# Patient Record
Sex: Male | Born: 1950 | Race: White | Hispanic: No | Marital: Married | Smoking: Current every day smoker
Health system: Southern US, Community
[De-identification: ages and names within clinical notes are randomized; demographics above are authoritative.]

## PROBLEM LIST (undated history)

## (undated) DIAGNOSIS — M1990 Unspecified osteoarthritis, unspecified site: Secondary | ICD-10-CM

## (undated) DIAGNOSIS — K219 Gastro-esophageal reflux disease without esophagitis: Secondary | ICD-10-CM

## (undated) DIAGNOSIS — R519 Headache, unspecified: Secondary | ICD-10-CM

## (undated) DIAGNOSIS — G8929 Other chronic pain: Secondary | ICD-10-CM

## (undated) DIAGNOSIS — J309 Allergic rhinitis, unspecified: Secondary | ICD-10-CM

## (undated) DIAGNOSIS — J449 Chronic obstructive pulmonary disease, unspecified: Secondary | ICD-10-CM

## (undated) HISTORY — PX: KNEE SURGERY: SHX244

## (undated) HISTORY — DX: Chronic obstructive pulmonary disease, unspecified: J44.9

## (undated) HISTORY — PX: JOINT REPLACEMENT: SHX530

## (undated) HISTORY — DX: Other chronic pain: G89.29

## (undated) HISTORY — PX: SHOULDER SURGERY: SHX246

## (undated) HISTORY — PX: HAND SURGERY: SHX662

## (undated) HISTORY — DX: Allergic rhinitis, unspecified: J30.9

---

## 2000-12-14 ENCOUNTER — Emergency Department (HOSPITAL_COMMUNITY): Admission: EM | Admit: 2000-12-14 | Discharge: 2000-12-14 | Payer: Self-pay | Admitting: Emergency Medicine

## 2001-12-02 ENCOUNTER — Emergency Department (HOSPITAL_COMMUNITY): Admission: EM | Admit: 2001-12-02 | Discharge: 2001-12-02 | Payer: Self-pay | Admitting: Emergency Medicine

## 2002-04-04 ENCOUNTER — Encounter: Payer: Self-pay | Admitting: Specialist

## 2002-04-04 ENCOUNTER — Ambulatory Visit (HOSPITAL_COMMUNITY): Admission: RE | Admit: 2002-04-04 | Discharge: 2002-04-04 | Payer: Self-pay | Admitting: Specialist

## 2004-01-18 ENCOUNTER — Ambulatory Visit (HOSPITAL_COMMUNITY): Admission: RE | Admit: 2004-01-18 | Discharge: 2004-01-18 | Payer: Self-pay | Admitting: Family Medicine

## 2004-01-22 ENCOUNTER — Ambulatory Visit (HOSPITAL_COMMUNITY): Admission: RE | Admit: 2004-01-22 | Discharge: 2004-01-22 | Payer: Self-pay | Admitting: Family Medicine

## 2004-03-05 ENCOUNTER — Inpatient Hospital Stay (HOSPITAL_COMMUNITY): Admission: RE | Admit: 2004-03-05 | Discharge: 2004-03-10 | Payer: Self-pay | Admitting: Specialist

## 2007-01-29 ENCOUNTER — Emergency Department (HOSPITAL_COMMUNITY): Admission: EM | Admit: 2007-01-29 | Discharge: 2007-01-29 | Payer: Self-pay | Admitting: Emergency Medicine

## 2008-05-12 HISTORY — PX: COLONOSCOPY: SHX174

## 2008-12-11 ENCOUNTER — Emergency Department (HOSPITAL_COMMUNITY): Admission: EM | Admit: 2008-12-11 | Discharge: 2008-12-11 | Payer: Self-pay | Admitting: Emergency Medicine

## 2009-02-04 ENCOUNTER — Emergency Department (HOSPITAL_COMMUNITY): Admission: EM | Admit: 2009-02-04 | Discharge: 2009-02-04 | Payer: Self-pay | Admitting: Emergency Medicine

## 2009-02-27 ENCOUNTER — Encounter: Payer: Self-pay | Admitting: Internal Medicine

## 2009-03-13 ENCOUNTER — Ambulatory Visit: Payer: Self-pay | Admitting: Internal Medicine

## 2009-03-13 ENCOUNTER — Encounter: Payer: Self-pay | Admitting: Internal Medicine

## 2009-03-13 ENCOUNTER — Ambulatory Visit (HOSPITAL_COMMUNITY): Admission: RE | Admit: 2009-03-13 | Discharge: 2009-03-13 | Payer: Self-pay | Admitting: Internal Medicine

## 2009-03-16 ENCOUNTER — Encounter: Payer: Self-pay | Admitting: Internal Medicine

## 2009-03-19 ENCOUNTER — Telehealth (INDEPENDENT_AMBULATORY_CARE_PROVIDER_SITE_OTHER): Payer: Self-pay

## 2009-04-12 ENCOUNTER — Telehealth (INDEPENDENT_AMBULATORY_CARE_PROVIDER_SITE_OTHER): Payer: Self-pay

## 2009-07-23 ENCOUNTER — Ambulatory Visit (HOSPITAL_COMMUNITY): Admission: RE | Admit: 2009-07-23 | Discharge: 2009-07-23 | Payer: Self-pay | Admitting: Specialist

## 2010-04-29 ENCOUNTER — Ambulatory Visit (HOSPITAL_COMMUNITY)
Admission: RE | Admit: 2010-04-29 | Discharge: 2010-04-29 | Payer: Self-pay | Source: Home / Self Care | Attending: Specialist | Admitting: Specialist

## 2010-06-26 ENCOUNTER — Other Ambulatory Visit: Payer: Self-pay | Admitting: Orthopedic Surgery

## 2010-06-26 ENCOUNTER — Encounter (HOSPITAL_COMMUNITY): Payer: BC Managed Care – PPO

## 2010-06-26 ENCOUNTER — Other Ambulatory Visit (HOSPITAL_COMMUNITY): Payer: Self-pay | Admitting: Orthopedic Surgery

## 2010-06-26 ENCOUNTER — Ambulatory Visit (HOSPITAL_COMMUNITY)
Admission: RE | Admit: 2010-06-26 | Discharge: 2010-06-26 | Disposition: A | Discharge status: Home / Self Care | Payer: BC Managed Care – PPO | Source: Ambulatory Visit | Attending: Orthopedic Surgery | Admitting: Orthopedic Surgery

## 2010-06-26 DIAGNOSIS — J438 Other emphysema: Secondary | ICD-10-CM | POA: Insufficient documentation

## 2010-06-26 DIAGNOSIS — Z96659 Presence of unspecified artificial knee joint: Secondary | ICD-10-CM | POA: Insufficient documentation

## 2010-06-26 DIAGNOSIS — M25569 Pain in unspecified knee: Secondary | ICD-10-CM | POA: Insufficient documentation

## 2010-06-26 DIAGNOSIS — Z01818 Encounter for other preprocedural examination: Secondary | ICD-10-CM

## 2010-06-26 DIAGNOSIS — R9431 Abnormal electrocardiogram [ECG] [EKG]: Secondary | ICD-10-CM | POA: Insufficient documentation

## 2010-06-26 DIAGNOSIS — Z01812 Encounter for preprocedural laboratory examination: Secondary | ICD-10-CM | POA: Insufficient documentation

## 2010-06-26 DIAGNOSIS — Z01810 Encounter for preprocedural cardiovascular examination: Secondary | ICD-10-CM | POA: Insufficient documentation

## 2010-06-26 DIAGNOSIS — T8489XA Other specified complication of internal orthopedic prosthetic devices, implants and grafts, initial encounter: Secondary | ICD-10-CM | POA: Insufficient documentation

## 2010-06-26 LAB — BASIC METABOLIC PANEL
Calcium: 9.1 mg/dL (ref 8.4–10.5)
Chloride: 105 mEq/L (ref 96–112)
Creatinine, Ser: 1.05 mg/dL (ref 0.4–1.5)
GFR calc Af Amer: >60 mL/min (ref >60)
GFR calc non Af Amer: >60 mL/min (ref >60)

## 2010-06-26 LAB — DIFFERENTIAL
Basophils Absolute: 0 10*3/uL (ref 0.0–0.1)
Basophils Relative: 0 % (ref 0–1)
Monocytes Relative: 7 % (ref 3–12)
Neutro Abs: 5.1 10*3/uL (ref 1.7–7.7)
Neutrophils Relative %: 70 % (ref 43–77)

## 2010-06-26 LAB — URINALYSIS, ROUTINE W REFLEX MICROSCOPIC
Ketones, ur: NEGATIVE mg/dL
Nitrite: NEGATIVE
Protein, ur: NEGATIVE mg/dL
Urobilinogen, UA: 0.2 mg/dL (ref 0.0–1.0)

## 2010-06-26 LAB — PROTIME-INR
INR: 1.01 (ref 0.00–1.49)
Prothrombin Time: 13.5 seconds (ref 11.6–15.2)

## 2010-06-26 LAB — CBC
Hemoglobin: 15.9 g/dL (ref 13.0–17.0)
MCHC: 33.8 g/dL (ref 30.0–36.0)

## 2010-06-26 LAB — APTT: aPTT: 35 seconds (ref 24–37)

## 2010-07-02 DIAGNOSIS — J449 Chronic obstructive pulmonary disease, unspecified: Secondary | ICD-10-CM | POA: Diagnosis present

## 2010-07-02 DIAGNOSIS — Y831 Surgical operation with implant of artificial internal device as the cause of abnormal reaction of the patient, or of later complication, without mention of misadventure at the time of the procedure: Secondary | ICD-10-CM | POA: Diagnosis present

## 2010-07-02 DIAGNOSIS — J4489 Other specified chronic obstructive pulmonary disease: Secondary | ICD-10-CM | POA: Diagnosis present

## 2010-07-02 DIAGNOSIS — Z96659 Presence of unspecified artificial knee joint: Secondary | ICD-10-CM

## 2010-07-02 DIAGNOSIS — T84039A Mechanical loosening of unspecified internal prosthetic joint, initial encounter: Principal | ICD-10-CM | POA: Diagnosis present

## 2010-07-02 LAB — ABO/RH: ABO/RH(D): O POS

## 2010-07-03 LAB — CBC
HCT: 37.8 % — ABNORMAL LOW (ref 39.0–52.0)
MCV: 90.4 fL (ref 78.0–100.0)
RBC: 4.18 MIL/uL — ABNORMAL LOW (ref 4.22–5.81)
WBC: 11.9 10*3/uL — ABNORMAL HIGH (ref 4.0–10.5)

## 2010-07-03 LAB — BASIC METABOLIC PANEL
BUN: 10 mg/dL (ref 6–23)
Chloride: 106 mEq/L (ref 96–112)
Glucose, Bld: 150 mg/dL — ABNORMAL HIGH (ref 70–99)
Potassium: 4.4 mEq/L (ref 3.5–5.1)
Sodium: 137 mEq/L (ref 135–145)

## 2010-07-05 NOTE — Op Note (Signed)
NAME:  Dustin Walker, Dustin Walker             ACCOUNT NO.:  [id number]  MEDICAL RECORD NO.:  [id number]           PATIENT TYPE:  I  LOCATION:  1612                         FACILITY:  [facility]  PHYSICIAN:  Madlyn Frankel. Charlann Boxer, M.D.  DATE OF BIRTH:  Jan 10, 1951  DATE OF PROCEDURE:  07/02/2010 DATE OF DISCHARGE:                              OPERATIVE REPORT   PREOPERATIVE DIAGNOSES: 1. 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 2. 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  POSTOPERATIVE DIAGNOSIS: 1. 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 2. 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  PROCEDURES: 1. Revision left total knee replacement with a complete knee     synovectomy. 2. Polyethylene exchange going to a size 15 mm insert to match the     size 9 tibial tray that was in place. 3. Patellar revision switching out to a size 11 domed patella     resurfacing, this was [employer] component.  SURGEON:  Madlyn Frankel. Charlann Boxer, MD  ASSISTANT:  Nelia Shi. Webb Silversmith, RN  ANESTHESIA:  Spinal.  SPECIMENS:  None.  COMPLICATIONS:  None.  DRAINS:  One Hemovac.  TOURNIQUET TIME:  45 minutes, 250 mmHg.  BLOOD LOSS:  50 mL.  INDICATIONS FOR THE PROCEDURE:  Mr. Medlen is a 60 year old male who had a history of a left total knee replacement performed approximately 6 years ago.  He had initially done well, but had developed recurrent instability sensation with recurrent effusions.  Workup was negative for infection as well as any loosening components by a bone scan.  Based on his physical exam of some hyperextension in play from global instability from extension to flexion, the plan was for revision surgery.  Discussed synovectomy and  polyethylene exchange the primary source of this tissues.  Consent was obtained after reviewing the risks of persistent discomfort, infection, DVT for revision surgery.  Consent was obtained for the benefit of pain relief.  PROCEDURE IN DETAIL:  The patient was brought to operative theater. Once adequate anesthesia, preoperative antibiotics, Ancef administered, he was position with a left thigh tourniquet.  Left lower extremity was then prepped and draped in a sterile fashion.  A time-out was performed identifying the patient, planned procedure, and extremity.  The leg was exsanguinated, tourniquet elevated to 250 mmHg.  The patient's old incision was utilized.  Sharp dissection and soft tissue flaps created, noted very thin skin.  Medial arthrotomy was made.  Following this arthrotomy, first portion of the procedure was designated to complete synovectomy through the medial suprapatellar and lateral gutters debriding of all brownish synovial stained tissue.  I then removed the polyethylene insert from the knee.  At this point, we again trialed from a 12-15 insert, found that the 15 insert provided me the most stability with full extension from extension to flexion.  With the knee in extension, I evaluated  the patella better.  He had a previously small button placed over the medial facet of the patella with a significant portion of lateral facet still exposed.  Based on his persistent pain, this definitely could have been a source of discomfort, in addition to the recurrent instability and swelling.  For this reason, I chose to resurface the patella.  Using oscillating saw, I removed the bone underneath the patellar button and precut measurement was about 26 mm a little bit less.  I resected down about 14 mm and used a size 11 patellar button to restore height as well as cover the entire cut surface.  Leg holes were drilled.  At this point, final components were opened including this 15  mm insert and the patellar button.  Cement was mixed.  We irrigated the knee out with normal saline solution, pulse lavaged, injected the synovial capsule region with 0.25% Marcaine with epinephrine and 1 mL of Toradol.  Once the cement was ready, the patellar component was cemented into position and excessive cement was removed.  Once the cement had fully cured, the final 15 mm insert was locked into position.  The knee was reduced.  We re-irrigated the knee at this point.  The tourniquet was let down after 45 minutes.  No significant hemostasis required.  A medium Hemovac drain was placed deep.  I reapproximated the extensor mechanism in flexion using #1 Vicryl.  The remaining wound was closed with 2-0 Vicryl and then staples on the skin.  The skin was cleaned, dried, and dressed sterilely using Xeroform and a bulky sterile wrap.  He was then brought to the recovery room in stable condition tolerating the procedure well.     Madlyn Frankel Charlann Boxer, M.D.     MDO/MEDQ  D:  07/02/2010  T:  07/03/2010  Job:  [id number]  Electronically Signed by Durene Romans M.D. on 07/05/2010 07:03:45 AM

## 2010-07-05 NOTE — Discharge Summary (Signed)
  NAME:  Dustin Walker, Dustin Walker             ACCOUNT NO.:  [id number]  MEDICAL RECORD NO.:  [id number]           PATIENT TYPE:  I  LOCATION:  1612                         FACILITY:  [facility]  PHYSICIAN:  Madlyn Frankel. Charlann Boxer, M.D.  DATE OF BIRTH:  [birth year]  DATE OF ADMISSION:  07/02/2010 DATE OF DISCHARGE:                              DISCHARGE SUMMARY   HISTORY:  This is a patient who was admitted on July 02, 2010, for a poly revision for his failed left knee arthroplasty.  He had been seen in the office and it was determined he needed to have the poly exchange completed.  ADMITTING DIAGNOSIS:  Failed left knee arthroplasty.  BRIEF HOSPITAL COURSE:  The patient was admitted through same-day surgery on July 02, 2010; taken to operating room theater and underwent the poly exchange and was taken to PACU for recovery and brought to [facility] for further recovery and rehabilitation.  He has done well.  He has been up with physical therapy.  Today, he will complete that 2 times and then discharged home this afternoon.  His diet is regular.  His wound is checked, is clean and dry.  He knows to take Aquacel dressing off and in 8 days, he will follow up with Dr. Charlann Boxer at that time to get the staples out.  His labs are stable today. His H and H is 12.4 and 37.8.  He is afebrile.  His vital signs are stable.  Discharge condition is good.  Discharge medications are as follows: 1. Acetaminophen 325 mg every 4 hours as needed. 2. Colace 100 mg twice daily. 3. Ferrous sulfate 325 mg 3 times a day. 4. Hydrocodone 7.5/325 one to two q.4-6 h p.r.n. pain, 5. Robaxin 500 every 6 hours as needed. 6. MiraLax 17 g a day as needed. 7. Xarelto 10 mg a day for 10 days.     Russell L. Webb Silversmith, RN   ______________________________ Madlyn Frankel Charlann Boxer, M.D.    RLW/MEDQ  D:  07/03/2010  T:  07/03/2010  Job:  [id number]  Electronically Signed by Lauree Chandler NP-C on 07/04/2010 01:22:30 PM Electronically Signed  by Durene Romans M.D. on 07/05/2010 07:03:49 AM

## 2010-08-16 LAB — D-DIMER, QUANTITATIVE: D-Dimer, Quant: 0.29 ug/mL-FEU (ref 0.00–0.48)

## 2010-09-09 NOTE — H&P (Signed)
  NAME:  Dustin Walker, Dustin Walker             ACCOUNT NO.:  [id number]  MEDICAL RECORD NO.:  [id number]           PATIENT TYPE:  I  LOCATION:  1S                           FACILITY:  [facility]  PHYSICIAN:  Madlyn Frankel. Charlann Boxer, M.D.  DATE OF BIRTH:  16-Jan-1951  DATE OF ADMISSION: DATE OF DISCHARGE:                             HISTORY & PHYSICAL   ADMITTING DIAGNOSIS:  Failed left knee arthroplasty.  BRIEF HISTORY:  This is a patient of Dr. Valma Cava, who underwent left knee arthroplasty about 6 years ago, who started having problems with ambulation and pain.  Through exam and radiographic reviews, it has been determined that he has got loose components and will undergo a revision on February 21 with Dr. Charlann Boxer.  PAST MEDICAL HISTORY:  Significant for: 1. Cataracts. 2. 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 3. He has some problems from time to time with joint pain, mostly in     the left knee.  SURGICAL HISTORY:  Significant for the left knee arthroplasty.  CURRENT MEDICATIONS:  Oxycodone as needed.  ALLERGIES:  HE HAS A MEDICINE ALLERGY TO CODEINE AND ALEVE.  REVIEW OF SYSTEMS:  Notable for those difficulties described in history of present illness, past medical history, his review of systems is otherwise unremarkable.  PHYSICAL EXAM:  VITAL SIGNS:  The patient is 5 feet 8 inches, 137 pounds, blood pressure is 110/70, respirations are 22.  His pulse 68. GENERAL:  Health is fair. HEENT:  Shows him to be normocephalic. NECK AND CHEST:  Unremarkable.  He has history of COPD with expiratory wheezes bilaterally in all lobes. HEART:  S1-S2.  No murmurs, rubs, or gallops heard. ABDOMEN:  Soft, nondistended. GI/GU:  Otherwise unremarkable. EXTREMITIES:  Exam shows joint pain in the left knee. DERMATOLOGICALLY AND NEUROLOGICALLY:  Intact.  Labs, x-rays, EKGs pending through [employer].  IMPRESSION:  Left failed knee arthroplasty.  PLAN:  He will be admitted on 21st for a revision of that left  knee with Dr. Charlann Boxer.  His discharge medications include Robaxin, MiraLax, Colace, given to him today.  His pain medicine will be given to him at discharge.     Russell L. Blima Dessert   ______________________________ Madlyn Frankel Charlann Boxer, M.D.    RLW/MEDQ  D:  06/20/2010  T:  06/20/2010  Job:  [id number]  Electronically Signed by Lauree Chandler NP-C on 09/05/2010 01:11:32 PM Electronically Signed by Durene Romans M.D. on 09/09/2010 12:12:16 PM

## 2010-09-27 NOTE — H&P (Signed)
NAME:  Dustin Walker, Dustin Walker NO.:  [id number]   MEDICAL RECORD NO.:  [id number]          PATIENT TYPE:   LOCATION:                                 FACILITY:   PHYSICIAN:  Dr. Thomasena Edis            DATE OF BIRTH:  [birth year]   DATE OF ADMISSION:  DATE OF DISCHARGE:                                HISTORY & PHYSICAL   CHIEF COMPLAINT:  Left knee osteoarthritis.   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 After  failure of conservative measures for resolution of his pain and limitations  of use, he is now scheduled for total knee arthroplasty of the left knee.  The surgery risks, benefits, and aftercare were discussed in detail with the  patient, questions were provided and answered, and surgery to go ahead and  scheduled.   ALLERGIES:  Drug allergy to COCAINE which causes itching and a rash.   CURRENT MEDICATIONS:  Percocet or Darvocet on a p.r.n. basis.   PREVIOUS SURGERY:  Knee arthroscopies and left thumb surgery.   SERIOUS MEDICAL ILLNESSES:  None.   SOCIAL HISTORY:  The patient is a Curator. He is married. He smokes 1 pack  a day and drinks rarely.   FAMILY HISTORY:  Negative.   REVIEW OF SYSTEMS:  CENTRAL NERVOUS SYSTEM:  Negative for headache, blurry  vision, or dizziness. PULMONARY:  Positive for an occasional seasonal cough.  Otherwise, negative for shortness of breath, PND, and orthopnea.  CARDIOVASCULAR:  Negative for chest pain and palpitations. GASTROINTESTINAL:  Negative for ulcers, hepatitis. GENITOURINARY:  Negative for urinary tract  difficulty. MUSCULOSKELETAL:  Positives in HPI.   PHYSICAL EXAMINATION:  VITAL SIGNS:  BP 110/60, respirations 16, pulse 68  and regular.  GENERAL APPEARANCE:  Well-developed, well-nourished gentleman in no acute  distress.  HEENT:  Head  normocephalic. Nose patent. Ears patent. Pupils are equal,  round, and reactive to light. Throat with injection.  NECK:  Supple without adenopathy. Carotids 2+ without bruit.  CHEST:  Clear to auscultation. No rales or rhonchi. Respirations 16.  HEART:  Regular rate and rhythm at 68 beats per minute without murmur.  ABDOMEN:  Soft with active bowel sounds. No masses or organomegaly.  NEUROLOGICAL:  The patient is alert and oriented to time, place, and person.  Cranial nerves II-XII grossly intact.  EXTREMITIES:  Within normal limits with the exception of the left knee which  has 0 to 135 degree range of motion, slight varus deformity, crepitation  throughout the range of motion with occasional catching. Sensation and  circulation are intact. Dorsalis pedis and posterior tibial pulses are 1+.   RADIOLOGIC FINDINGS:  X-rays show end-stage osteoarthritis, left knee.   IMPRESSION:  End-stage osteoarthritis, left knee.   PLAN:  Total knee arthroplasty, left knee.      ________________________________________  Jaquelyn Bitter. Chabon, P.A.  ___________________________________________  Dr. Thomasena Edis    SJC/MEDQ  D:  02/26/2004  T:  02/26/2004  Job:  [id number]

## 2010-09-27 NOTE — Discharge Summary (Signed)
NAME:  Dustin Walker, Dustin Walker NO.:  [id number]   MEDICAL RECORD NO.:  [id number]          PATIENT TYPE:  INP   LOCATION:  0456                         FACILITY:  [facility]   PHYSICIAN:  Erasmo Leventhal, M.D.DATE OF BIRTH:  10-07-1950   DATE OF ADMISSION:  03/05/2004  DATE OF DISCHARGE:  03/10/2004                                 DISCHARGE SUMMARY   ADMISSION DIAGNOSIS:  Left knee end-stage osteoarthritis.   POSTOPERATIVE DIAGNOSIS:  Left knee end-stage osteoarthritis.   OPERATION:  Total knee arthroplasty, left knee.   BRIEF HISTORY:  This is a 60 year old gentleman with a long history of  problems with his left knee with multiple surgeries in the past, who has  developed end-stage osteoarthritis of the left knee after failure of  conservative measures for resolution of his pain and limitation of use.  He  is now scheduled for a total knee arthroplasty of the left knee.  The  surgery, its benefits, and aftercare were discussed in detail.  The  patient's questions were invited and answered.  Surgery was scheduled.   LABORATORY VALUES:  Admission CBC within normal limits.  Hemoglobin and  hematocrit reached a low of 9.1 and 26.8.  Admission PT/PTT within normal  limits.  Discharge PT 23 with an INR of 2.7.  Admission CMET within normal  limits.  He had mild hyponatremia at 133 and 134 during admission, and  glucose mildly elevated intermittently with a high of 174.   HOSPITAL COURSE:  The first postoperative day, the patient was feeling good.  Vital signs are stable.  Temp was to 100.3 max.  I's and O's were good.  Drain put out 415.  Hemoglobin and hematocrit were stable.  Lungs were  clear.  Heart sounds were normal.  Bowel sounds were sluggish.  Calves were  negative.  Neurovascular status was intact.  His incentive spirometry was  increased to every 30 minutes for his mild fever.  Drains were removed  without difficulty, and CPM was started.   The second  postoperative day, he was feeling okay.  Moderate pain was noted.  He had a temp to 102.6 max, now 100.7.  I's and O's were good.  Hemoglobin  and hematocrit were acceptable at 10.1 and 30.4.  O2 sats were down to 93 on  2 liters.  Lungs were decreased breath sounds at the bases.  Chest x-ray  showed probable left lower lobe pneumonia with chronic bronchitis changes.  Medical consult with Dr. Jamie Brookes was obtained, and he recommended  switching him to Avelox, which was done immediately, and then he was seen by  medical service later for further followup.  He was also started on Humibid  and Advair, which helped his breathing and oxygenation.   On the third postoperative day, he was feeling better.  Vital signs were  stable.  Mild tachycardia at 105.  Temp to 102.2.  O2 sat is 93% on room  air.  Hemoglobin and hematocrit 9.1 and 26.8.  Lungs had scattered rhonchi.  Heart sounds are normal.  Bowel sounds are active.  Calves negative.  He  continued  with physical therapy and occupational therapy and continued his  treatment for pneumonia with possible discharge planning for Sunday, if he  was doing well from a medical standpoint.   On the fourth postoperative day, he improved.  His temperature was only to a  max of 101.4.  Vital signs were stable.  The wound was benign.  Calves were  negative.  The medical service felt he was doing better and should be ready  for discharge on Sunday, if he continued to do well.   On the fifth postoperative day, vital signs were stable.  He was afebrile.  O2 sats of 98% on room air were noted.  INR is 2.7.  Lungs were clear.  The  wound was benign.  Calves were negative.  Medical service felt he was ready  for discharge.  He was subsequently discharged home for followup in the  office by [place] in 10 days and followup in one week with Dr. Gerda Diss, his  medical doctor at home.   CONDITION ON DISCHARGE:  Improved.   DISCHARGE MEDICATIONS:  1.  Percocet 1-2  q.4-6h. p.r.n. pain.  2.  Robaxin 1 q.8h. p.r.n. spasm.  3.  Coumadin per pharmacy protocol.  4.  Trinsicon 1 b.i.d.  5.  Avelox per medical service.  6.  Asthma inhalers per medical service.   DISCHARGE INSTRUCTIONS:  He is to do his home therapy.  Return to see Dr.  Gerda Diss next week.  Call to see [place] back in 10 days.      SJC/MEDQ  D:  03/25/2004  T:  03/25/2004  Job:  [id number]   cc:   [facility]

## 2010-09-27 NOTE — Procedures (Signed)
NAME:  BEARL, TALARICO NO.:  [id number]   MEDICAL RECORD NO.:  [id number]                   PATIENT TYPE:  OUT   LOCATION:  RESP                                 FACILITY:  [facility]   PHYSICIAN:  Edward L. Juanetta Gosling, M.D.             DATE OF BIRTH:  1950/11/02   DATE OF PROCEDURE:  DATE OF DISCHARGE:  01/22/2004                              PULMONARY FUNCTION TEST   RESULTS:  1.  Spirometry shows a moderate ventilatory defect with evidence of air flow      obstruction.  The flow volume loop shows marked waver and may not be      totally accurate.  2.  Lung volumes show minimal total restrictive change with total lung      capacity being 78% of predicted (normal is 80%), but there is marked air      trapping with residual volume to total lung capacity ratio of 173% of      predicted.  3.  DLCO is severely reduced.  4.  There is no significant bronchodilator response.      ___________________________________________                                            Oneal Deputy. Juanetta Gosling, M.D.   ELH/MEDQ  D:  01/24/2004  T:  01/24/2004  Job:  [id number]

## 2010-09-27 NOTE — Op Note (Signed)
NAME:  Dustin Walker, Dustin Walker NO.:  [id number]   MEDICAL RECORD NO.:  [id number]          PATIENT TYPE:  INP   LOCATION:  0456                         [facility]ACILITY:  [facility]   PHYSICIAN:  Erasmo Leventhal, M.D.DATE OF BIRTH:  03/02/1951   DATE OF PROCEDURE:  03/05/2004  DATE OF DISCHARGE:                                 OPERATIVE REPORT   PREOPERATIVE DIAGNOSIS:  Left knee osteoarthritis.   POSTOPERATIVE DIAGNOSIS:  Left knee osteoarthritis.   PROCEDURE:  Left total knee arthroplasty.   SURGEON:  Erasmo Leventhal, M.D.   ASSISTANT:  Jaquelyn Bitter. Chabon, P.A.   ANESTHESIA:  Epidural with general.   ESTIMATED BLOOD LOSS:  Less than 50 mL.   DRAINS:  Two medium Hemovacs.   COMPLICATIONS:  None.   TOURNIQUET TIME:  One hour and 30 minutes at 350 mmHg.   COMPLICATIONS:  None.   DISPOSITION:  To PACU, stable.   OPERATIVE IMPLANTS:  Osteonics components, all cemented, posterior-  stabilized.  Size 9 femur, size 9 tibia, 10-mm posterior-stabilized tibial  insert with a 26-mm all-polyethylene patella.   OPERATIVE DETAILS:  The patient was counseled in the holding area.  Epidural  was administered.  IV had been started and antibiotics had been given.  Taken to the OR and placed in supine position, and general anesthesia.  Foley catheter was placed utilizing sterile technique by the OR circulating  nurse, both lower extremities well-padded and __________ left knee was  examined.  It had a 3-degree flexion contracture, good flexion to 135  degrees.  Elevated, prepped with Duraprep and all were draped into a sterile  fashion, exsanguinated and Esmarch tourniquet was inflated to 350 mmHg.   A standard midline incision was made through the skin and subcutaneous  tissue.  Medially, all soft tissue flaps were developed.  Medial  parapatellar arthrotomy was performed and a proximal medial soft tissue  release was done.  The patella was everted and knee was flexed,  end-stage  arthritic changes, bone against bone, worse in the lateral side.  Cruciate  ligaments were resected.  A starter hole was made in the distal femur, canal  irrigated, efflux was clear.  Intramedullary rod was gently placed.  I chose  a 5-degree valgus cut and took a 10-mm cut off the distal femur.  The distal  femur was found to be a size 9.  Rotational marks were made and distal femur  was cut to fit a size 9.  Medial and lateral meniscal remnants were removed  with cautery.  Geniculate vessel was coagulated.  Posterior neurovascular  structures were thought of and protected throughout the entire case.  Tibial  eminence was resected.  The proximal tibia was found to be a size 9.  A  starter hole was made.  A step reamer was utilized.  Canal was irrigated,  the efflux clear.  Intramedullary rod was gently placed and I took a 10-mm  cut, both from the least deficient side.  This was done with a 0-degree  slope.  Posteromedial and posterolateral femoral chamfers were developed  with an osteotome under direct visualization,  protecting the posterior  neurovascular structures.  Femoral trochlea was prepared in a sterile  fashion.  At this time, a size 9 femur, size 9 tibia and 10-mm Flex insert  were posterior-stabilized with excellent range of motion, soft tissue  balance in flexion and extension.  Rotational marks were made and a Delta  keel was performed in the standard fashion.  The patella was found to be a  size 26 and reamed appropriately, and DF locking holes were made.  At this  time, utilizing modern cement technique, all components were cemented into  place after thorough pulsatile lavage of the knee, size 9 tibia, size 9  femur, 26 patella.  After the cement had cured, with a 10-mm Flex posterior-  stabilized tibial insert, we had excellent range of motion.  We had  excellent balance in flexion and extension; patellofemoral tracking was  anatomic.  Tibial trial was removed,  excess cement was removed and the final  10-mm-thick posterior-stabilized Flex tibial component was implanted.  Bone  wax was placed on exposed bony surfaces.  Knee was put through a range of  motion, again well-balanced and nicely stable with anatomic patellofemoral  tracking, and did not require a lateral release.  The knee joint was  irrigated with antibiotic solution  at this point in time, as was achieved  later during the closure.  Sequential closure in layers was done with  arthrotomy with Vicryl, subcu with Vicryl, skin closed with separate  Monocryl suture.  Steri-Strips were applied, drains hooked to suction,  tourniquet was deflated and another gram of Ancef given intravenously as the  tourniquet deflated.  Sterile compressive dressing was applied to the knee  along with an ice pack and knee immobilizer.  Sponge and needle counts were  correct.  There were no complications.  He had excellent circulation in the  foot and ankle at the end of the case, and he was then gently awakened and  he was then taken from the operating room to PACU in stable condition.  No  complications.   To decrease surgical time and for help throughout the entire surgical  procedure, Mr. Brett Canales Chabon's assistance was needed.      RAC/MEDQ  D:  03/05/2004  T:  03/06/2004  Job:  [id number]

## 2011-06-19 ENCOUNTER — Encounter (HOSPITAL_COMMUNITY): Payer: Self-pay | Admitting: *Deleted

## 2011-06-19 ENCOUNTER — Emergency Department (HOSPITAL_COMMUNITY): Payer: Worker's Compensation

## 2011-06-19 ENCOUNTER — Emergency Department (HOSPITAL_COMMUNITY): Payer: Worker's Compensation | Admitting: Certified Registered"

## 2011-06-19 ENCOUNTER — Encounter (HOSPITAL_COMMUNITY): Payer: Self-pay | Admitting: Certified Registered"

## 2011-06-19 ENCOUNTER — Inpatient Hospital Stay (HOSPITAL_COMMUNITY)
Admission: EM | Admit: 2011-06-19 | Discharge: 2011-06-21 | DRG: 502 | Disposition: A | Discharge status: Home / Self Care | Payer: Worker's Compensation | Attending: General Surgery | Admitting: General Surgery

## 2011-06-19 ENCOUNTER — Encounter (HOSPITAL_COMMUNITY)
Admission: EM | Disposition: A | Discharge status: Home / Self Care | Payer: Self-pay | Source: Home / Self Care | Attending: General Surgery

## 2011-06-19 DIAGNOSIS — J438 Other emphysema: Secondary | ICD-10-CM | POA: Diagnosis present

## 2011-06-19 DIAGNOSIS — Z888 Allergy status to other drugs, medicaments and biological substances status: Secondary | ICD-10-CM

## 2011-06-19 DIAGNOSIS — S5290XB Unspecified fracture of unspecified forearm, initial encounter for open fracture type I or II: Secondary | ICD-10-CM

## 2011-06-19 DIAGNOSIS — S52309A Unspecified fracture of shaft of unspecified radius, initial encounter for closed fracture: Principal | ICD-10-CM | POA: Diagnosis present

## 2011-06-19 DIAGNOSIS — S62309A Unspecified fracture of unspecified metacarpal bone, initial encounter for closed fracture: Secondary | ICD-10-CM | POA: Diagnosis present

## 2011-06-19 DIAGNOSIS — W3189XA Contact with other specified machinery, initial encounter: Secondary | ICD-10-CM

## 2011-06-19 DIAGNOSIS — F17200 Nicotine dependence, unspecified, uncomplicated: Secondary | ICD-10-CM | POA: Diagnosis present

## 2011-06-19 DIAGNOSIS — Y9269 Other specified industrial and construction area as the place of occurrence of the external cause: Secondary | ICD-10-CM

## 2011-06-19 DIAGNOSIS — S52609A Unspecified fracture of lower end of unspecified ulna, initial encounter for closed fracture: Secondary | ICD-10-CM | POA: Diagnosis present

## 2011-06-19 DIAGNOSIS — Z23 Encounter for immunization: Secondary | ICD-10-CM

## 2011-06-19 HISTORY — PX: ORIF ULNAR FRACTURE: SHX5417

## 2011-06-19 HISTORY — PX: I & D EXTREMITY: SHX5045

## 2011-06-19 LAB — BASIC METABOLIC PANEL
BUN: 23 mg/dL (ref 6–23)
CO2: 23 mEq/L (ref 19–32)
GFR calc non Af Amer: >90 mL/min (ref >90)
Glucose, Bld: 119 mg/dL — ABNORMAL HIGH (ref 70–99)
Potassium: 4.4 mEq/L (ref 3.5–5.1)

## 2011-06-19 LAB — CBC
HCT: 39.7 % (ref 39.0–52.0)
Hemoglobin: 13.6 g/dL (ref 13.0–17.0)
MCHC: 34.3 g/dL (ref 30.0–36.0)
RBC: 4.39 MIL/uL (ref 4.22–5.81)

## 2011-06-19 SURGERY — OPEN REDUCTION INTERNAL FIXATION (ORIF) ULNAR FRACTURE
Anesthesia: General | Site: Hand | Laterality: Left | Wound class: Clean Contaminated

## 2011-06-19 MED ORDER — CEFAZOLIN SODIUM 1-5 GM-% IV SOLN
1.0000 g | Freq: Once | INTRAVENOUS | Status: AC
  Administered 2011-06-19: 1 g via INTRAVENOUS
  Filled 2011-06-19: qty 50

## 2011-06-19 MED ORDER — HYDROMORPHONE HCL PF 1 MG/ML IJ SOLN
1.0000 mg | Freq: Once | INTRAMUSCULAR | Status: AC
  Administered 2011-06-19: 1 mg via INTRAVENOUS
  Filled 2011-06-19: qty 1

## 2011-06-19 MED ORDER — PROPOFOL 10 MG/ML IV EMUL
INTRAVENOUS | Status: DC | PRN
  Administered 2011-06-19: 170 mg via INTRAVENOUS

## 2011-06-19 MED ORDER — SUFENTANIL CITRATE 50 MCG/ML IV SOLN
INTRAVENOUS | Status: DC | PRN
  Administered 2011-06-19 – 2011-06-20 (×4): 10 ug via INTRAVENOUS

## 2011-06-19 MED ORDER — SODIUM CHLORIDE 0.9 % IV BOLUS (SEPSIS)
1000.0000 mL | Freq: Once | INTRAVENOUS | Status: AC
  Administered 2011-06-19: 1000 mL via INTRAVENOUS

## 2011-06-19 MED ORDER — SODIUM CHLORIDE 0.9 % IV SOLN
INTRAVENOUS | Status: DC | PRN
  Administered 2011-06-19: via INTRAVENOUS

## 2011-06-19 MED ORDER — HYDROMORPHONE HCL PF 2 MG/ML IJ SOLN
2.0000 mg | Freq: Once | INTRAMUSCULAR | Status: AC
  Administered 2011-06-19: 2 mg via INTRAVENOUS
  Filled 2011-06-19: qty 1

## 2011-06-19 MED ORDER — ONDANSETRON HCL 4 MG/2ML IJ SOLN
4.0000 mg | Freq: Once | INTRAMUSCULAR | Status: AC
  Administered 2011-06-19: 4 mg via INTRAVENOUS
  Filled 2011-06-19: qty 2

## 2011-06-19 MED ORDER — MIDAZOLAM HCL 5 MG/5ML IJ SOLN
INTRAMUSCULAR | Status: DC | PRN
  Administered 2011-06-19: 2 mg via INTRAVENOUS

## 2011-06-19 SURGICAL SUPPLY — 64 items
BANDAGE ELASTIC 3 VELCRO ST LF (GAUZE/BANDAGES/DRESSINGS) ×4 IMPLANT
BANDAGE ELASTIC 4 VELCRO ST LF (GAUZE/BANDAGES/DRESSINGS) ×4 IMPLANT
BIT DRILL 2.0MM 105MM (BIT) ×1 IMPLANT
BLADE SURG ROTATE 9660 (MISCELLANEOUS) IMPLANT
BNDG CMPR 9X4 STRL LF SNTH (GAUZE/BANDAGES/DRESSINGS) ×3
BNDG ESMARK 4X9 LF (GAUZE/BANDAGES/DRESSINGS) ×4 IMPLANT
CLOTH BEACON ORANGE TIMEOUT ST (SAFETY) ×4 IMPLANT
CORDS BIPOLAR (ELECTRODE) ×4 IMPLANT
COVER SURGICAL LIGHT HANDLE (MISCELLANEOUS) ×5 IMPLANT
DECANTER SPIKE VIAL GLASS SM (MISCELLANEOUS) IMPLANT
DRAIN TLS ROUND 10FR (DRAIN) IMPLANT
DRAPE OEC MINIVIEW 54X84 (DRAPES) ×1 IMPLANT
DRAPE SURG 17X23 STRL (DRAPES) ×4 IMPLANT
DRSG XEROFORM 1X8 (GAUZE/BANDAGES/DRESSINGS) ×1 IMPLANT
DURAPREP 26ML APPLICATOR (WOUND CARE) ×3 IMPLANT
ELECT REM PT RETURN 9FT ADLT (ELECTROSURGICAL) ×4
ELECTRODE REM PT RTRN 9FT ADLT (ELECTROSURGICAL) IMPLANT
GAUZE KERLIX 2  STERILE LF (GAUZE/BANDAGES/DRESSINGS) ×1 IMPLANT
GAUZE SPONGE 4X4 12PLY STRL LF (GAUZE/BANDAGES/DRESSINGS) ×1 IMPLANT
GAUZE XEROFORM 1X8 LF (GAUZE/BANDAGES/DRESSINGS) ×4 IMPLANT
GLOVE BIO SURGEON STRL SZ 6.5 (GLOVE) ×1 IMPLANT
GLOVE BIOGEL M STRL SZ7.5 (GLOVE) ×4 IMPLANT
GLOVE BIOGEL PI IND STRL 6.5 (GLOVE) IMPLANT
GLOVE BIOGEL PI INDICATOR 6.5 (GLOVE) ×2
GLOVE ECLIPSE 6.5 STRL STRAW (GLOVE) ×1 IMPLANT
GOWN PREVENTION PLUS XLARGE (GOWN DISPOSABLE) ×4 IMPLANT
GOWN STRL NON-REIN LRG LVL3 (GOWN DISPOSABLE) ×10 IMPLANT
HIBICLENS 32OZ XXX (MISCELLANEOUS) ×1 IMPLANT
KIT BASIN OR (CUSTOM PROCEDURE TRAY) ×4 IMPLANT
KIT ROOM TURNOVER OR (KITS) ×4 IMPLANT
MANIFOLD NEPTUNE II (INSTRUMENTS) ×4 IMPLANT
NEEDLE 22X1 1/2 (OR ONLY) (NEEDLE) IMPLANT
NS IRRIG 1000ML POUR BTL (IV SOLUTION) ×4 IMPLANT
PACK ORTHO EXTREMITY (CUSTOM PROCEDURE TRAY) ×4 IMPLANT
PAD ARMBOARD 7.5X6 YLW CONV (MISCELLANEOUS) ×8 IMPLANT
PAD CAST 4YDX4 CTTN HI CHSV (CAST SUPPLIES) ×3 IMPLANT
PADDING CAST ABS 3INX4YD NS (CAST SUPPLIES) ×1
PADDING CAST ABS COTTON 3X4 (CAST SUPPLIES) IMPLANT
PADDING CAST COTTON 4X4 STRL (CAST SUPPLIES) ×4
PLATE BONE LONG 76MML 13HOLE (Plate) ×1 IMPLANT
SCREW BONE 2.7X14 (Screw) ×3 IMPLANT
SCREW BONE 2.7X16MM (Screw) ×3 IMPLANT
SCREW LOCKING 2.7X16 (Screw) ×1 IMPLANT
SCREW LOCKING 2.7X18 (Screw) ×2 IMPLANT
SCREW LOCKING 2.7X20 (Screw) ×2 IMPLANT
SCREW LOCKING 2.7X22 (Screw) ×2 IMPLANT
SCREWDRIVER BLADE ×1 IMPLANT
SOAP 2 % CHG 4 OZ (WOUND CARE) ×3 IMPLANT
SPONGE GAUZE 4X4 12PLY (GAUZE/BANDAGES/DRESSINGS) ×4 IMPLANT
SPONGE LAP 4X18 X RAY DECT (DISPOSABLE) ×4 IMPLANT
STAPLER VISISTAT 35W (STAPLE) ×1 IMPLANT
SUCTION FRAZIER TIP 10 FR DISP (SUCTIONS) ×3 IMPLANT
SUT MNCRL AB 4-0 PS2 18 (SUTURE) ×3 IMPLANT
SUT VIC AB 3-0 SH 27 (SUTURE) ×4
SUT VIC AB 3-0 SH 27X BRD (SUTURE) IMPLANT
SUT VIC AB 4-0 PS2 27 (SUTURE) ×1 IMPLANT
SUT VICRYL 4-0 PS2 18IN ABS (SUTURE) ×3 IMPLANT
SYR CONTROL 10ML LL (SYRINGE) IMPLANT
SYSTEM CHEST DRAIN TLS 7FR (DRAIN) IMPLANT
TOWEL OR 17X24 6PK STRL BLUE (TOWEL DISPOSABLE) ×4 IMPLANT
TOWEL OR 17X26 10 PK STRL BLUE (TOWEL DISPOSABLE) ×4 IMPLANT
TUBE CONNECTING 12X1/4 (SUCTIONS) ×4 IMPLANT
WATER STERILE IRR 1000ML POUR (IV SOLUTION) ×3 IMPLANT
YANKAUER SUCT BULB TIP NO VENT (SUCTIONS) ×1 IMPLANT

## 2011-06-19 NOTE — Anesthesia Preprocedure Evaluation (Addendum)
Anesthesia Evaluation  Patient identified by MRN, date of birth, ID band Patient awake    Reviewed: Allergy & Precautions, H&P , NPO status , Patient's Chart, lab work & pertinent test results  History of Anesthesia Complications Negative for: history of anesthetic complications  Airway Mallampati: I  Neck ROM: Full    Dental  (+) Edentulous Upper and Edentulous Lower   Pulmonary COPDCurrent Smoker,  clear to auscultation        Cardiovascular neg cardio ROS Regular Normal    Neuro/Psych Negative Neurological ROS  Negative Psych ROS   GI/Hepatic negative GI ROS, Neg liver ROS,   Endo/Other  Negative Endocrine ROS  Renal/GU negative Renal ROS  Genitourinary negative   Musculoskeletal   Abdominal   Peds  Hematology negative hematology ROS (+)   Anesthesia Other Findings   Reproductive/Obstetrics                          Anesthesia Physical Anesthesia Plan  ASA: II and Emergent  Anesthesia Plan: General   Post-op Pain Management:    Induction: Intravenous, Rapid sequence and Cricoid pressure planned  Airway Management Planned: Oral ETT  Additional Equipment:   Intra-op Plan:   Post-operative Plan: Extubation in OR  Informed Consent: I have reviewed the patients History and Physical, chart, labs and discussed the procedure including the risks, benefits and alternatives for the proposed anesthesia with the patient or authorized representative who has indicated his/her understanding and acceptance.     Plan Discussed with: CRNA, Anesthesiologist and Surgeon  Anesthesia Plan Comments:        Anesthesia Quick Evaluation

## 2011-06-19 NOTE — H&P (Signed)
Reason for Consult:L arm/hand injury Referring Physician: ERj physician   Dustin Walker is an 61 y.o. left handed male.  HPI: Pt got his L hand and distal forearm caught in a conveyor belt apparatus at work this evening, pain 10/10, constant, dull, throbbing, radiating up forearm; difficulty moving fingers, wrist;   History reviewed. No pertinent past medical history. - COPD  History reviewed. No pertinent past surgical history. b/l knee surgery, R shoulder surgery  History reviewed. No pertinent family history.  Social History: h/o tobaccco  Allergies:  Allergies  Allergen Reactions  . Naproxen Sodium (Aleve) Rash    "Burns from inside out"  . Codeine Rash    burning    Medications: I have reviewed the patient's current medications.  Results for orders placed during the hospital encounter of 06/19/11 (from the past 48 hour(s))  CBC     Status: Abnormal   Collection Time   06/19/11 10:52 PM      Component Value Range Comment   WBC 12.5 (*) 4.0 - 10.5 (K/uL)    RBC 4.39  4.22 - 5.81 (MIL/uL)    Hemoglobin 13.6  13.0 - 17.0 (g/dL)    HCT 16.1  09.6 - 04.5 (%)    MCV 90.4  78.0 - 100.0 (fL)    MCH 31.0  26.0 - 34.0 (pg)    MCHC 34.3  30.0 - 36.0 (g/dL)    RDW 40.9  81.1 - 91.4 (%)    Platelets 175  150 - 400 (K/uL)     Dg Forearm Left  06/19/2011  *RADIOLOGY REPORT*  Clinical Data: 61 year old male with machine injury to the left upper extremity.  LEFT HAND - COMPLETE 3+ VIEW, LEFT FOREARM - 2 VIEW  Comparison: None.  Findings: Left forearm:  No definite left elbow joint effusion.  Distal left humerus and proximal left radius and ulna appear intact.  Severely comminuted distal left radius fracture extends from the distal radial ulnar joint proximally almost 6 cm.  Distal butterfly fragment shows dorsal angulation and displacement.  There is mild radial angulation and displacement. Associated mildly displaced left ulnar styloid fracture. Comminuted minimally-displaced left  trapezium fracture.  Small fragment situated between the radial aspect of the scaphoid and trapezium may represent a small scaphoid avulsion.  Left hand:  Comminuted fractures of the distal third of the second and third left metacarpal.  Both shoulder ulnar angulation.  The third metacarpal fragment shows mild ulnar displacement.  Slight volar displacement. Other metacarpals appear intact.  No fracture of the phalanges identified.  Dorsal soft tissue swelling and subcutaneous gas.  IMPRESSION: 1.  Comminuted long segment fracture of the distal left radius with DRU involvement, dorsal angulation and displacement.  Unclear if the fracture plane extends to the radiocarpal joint. 2.  Minimally to nondisplaced fractures of the left trapezium and distal scaphoid (avulsion) suspected. 3.  Comminuted fractures of the second and third metacarpals, suspected to be open fractures given suggestion of subcutaneous gas at the dorsum of the hand. 4.  Ulnar styloid fracture.  Original Report Authenticated By: Harley Hallmark, M.D.   Dg Hand Complete Left  06/19/2011  *RADIOLOGY REPORT*  Clinical Data: 61 year old male with machine injury to the left upper extremity.  LEFT HAND - COMPLETE 3+ VIEW, LEFT FOREARM - 2 VIEW  Comparison: None.  Findings: Left forearm:  No definite left elbow joint effusion.  Distal left humerus and proximal left radius and ulna appear intact.  Severely comminuted distal left radius fracture extends  from the distal radial ulnar joint proximally almost 6 cm.  Distal butterfly fragment shows dorsal angulation and displacement.  There is mild radial angulation and displacement. Associated mildly displaced left ulnar styloid fracture. Comminuted minimally-displaced left trapezium fracture.  Small fragment situated between the radial aspect of the scaphoid and trapezium may represent a small scaphoid avulsion.  Left hand:  Comminuted fractures of the distal third of the second and third left metacarpal.  Both  shoulder ulnar angulation.  The third metacarpal fragment shows mild ulnar displacement.  Slight volar displacement. Other metacarpals appear intact.  No fracture of the phalanges identified.  Dorsal soft tissue swelling and subcutaneous gas.  IMPRESSION: 1.  Comminuted long segment fracture of the distal left radius with DRU involvement, dorsal angulation and displacement.  Unclear if the fracture plane extends to the radiocarpal joint. 2.  Minimally to nondisplaced fractures of the left trapezium and distal scaphoid (avulsion) suspected. 3.  Comminuted fractures of the second and third metacarpals, suspected to be open fractures given suggestion of subcutaneous gas at the dorsum of the hand. 4.  Ulnar styloid fracture.  Original Report Authenticated By: Harley Hallmark, M.D.    A comprehensive review of systems was negative except for: Respiratory: positive for emphysema Musculoskeletal: positive for b/l knees, R shoulder, current injury LUE Temp:  [97.6 F (36.4 C)] 97.6 F (36.4 C) (02/07 2118) Pulse Rate:  [68-71] 71  (02/07 2200) Resp:  [11-14] 14  (02/07 2200) BP: (107-116)/(69-71) 107/71 mmHg (02/07 2200) SpO2:  [94 %-99 %] 94 % (02/07 2200) FiO2 (%):  [29 %] 29 % (02/07 2118) General appearance: alert, cooperative and appears older than stated age Resp: clear to auscultation bilaterally Cardio: regular rate and rhythm Extremities: edema of L hand, some superficial abrasions dorsally, wrist, distal forearm deformity, no scissoring of digits, grossly n/v intact   Assessment/Plan: Distal radius/radial shaft fracture, ulnar styloid fracture, 2nd, 3rd metacarpal fractures, avulsion fractures of ?scaphoid, trapezium  Risks and description of surgery and recovery explained to the patient and significant other in detail; all questions answered.  Will proceed with ORIF.  Dustin Walker CHRISTOPHER 06/19/2011, 11:12 PM

## 2011-06-19 NOTE — ED Provider Notes (Signed)
History     CSN: [id number]  Arrival date & time 06/19/11  2105   First MD Initiated Contact with Patient 06/19/11 2111      Chief Complaint  Patient presents with  . Arm Injury    HPI The patient presents immediately after sustaining a crush injury.  He was in his [employer] prior to having his left arm caught in a rolling press.  The arm was dragged into the rollers to the mid-forearm.  He notes significant pain throughout the arm distal to the elbow.  The pain is sharp, worse w motion.  No attempts at relief w anything.  No other complaints. History reviewed. No pertinent past medical history.  History reviewed. No pertinent past surgical history.  History reviewed. No pertinent family history.  History  Substance Use Topics  . Smoking status: Not on file  . Smokeless tobacco: Not on file  . Alcohol Use: Not on file      Review of Systems  All other systems reviewed and are negative.    Allergies  Naproxen sodium and Codeine  Home Medications  No current outpatient prescriptions on file.  BP 107/71  Pulse 71  Temp(Src) 97.6 F (36.4 C) (Oral)  Resp 14  SpO2 94%  Physical Exam  Constitutional: He is oriented to person, place, and time. He appears distressed.  HENT:  Head: Normocephalic and atraumatic.  Eyes: Conjunctivae and EOM are normal.  Cardiovascular: Normal rate and regular rhythm.   Pulmonary/Chest: Effort normal. No stridor.  Abdominal: He exhibits no distension.  Musculoskeletal:       Arms: Neurological: He is alert and oriented to person, place, and time. No cranial nerve deficit. He exhibits normal muscle tone. Coordination normal.  Skin: Skin is warm and dry.  Psychiatric: He has a normal mood and affect.    ED Course  Procedures (including critical care time)  Labs Reviewed  CBC - Abnormal; Notable for the following:    WBC 12.5 (*)    All other components within normal limits  BASIC METABOLIC PANEL   Dg Forearm Left  06/19/2011   *RADIOLOGY REPORT*  Clinical Data: 61 year old male with machine injury to the left upper extremity.  LEFT HAND - COMPLETE 3+ VIEW, LEFT FOREARM - 2 VIEW  Comparison: None.  Findings: Left forearm:  No definite left elbow joint effusion.  Distal left humerus and proximal left radius and ulna appear intact.  Severely comminuted distal left radius fracture extends from the distal radial ulnar joint proximally almost 6 cm.  Distal butterfly fragment shows dorsal angulation and displacement.  There is mild radial angulation and displacement. Associated mildly displaced left ulnar styloid fracture. Comminuted minimally-displaced left trapezium fracture.  Small fragment situated between the radial aspect of the scaphoid and trapezium may represent a small scaphoid avulsion.  Left hand:  Comminuted fractures of the distal third of the second and third left metacarpal.  Both shoulder ulnar angulation.  The third metacarpal fragment shows mild ulnar displacement.  Slight volar displacement. Other metacarpals appear intact.  No fracture of the phalanges identified.  Dorsal soft tissue swelling and subcutaneous gas.  IMPRESSION: 1.  Comminuted long segment fracture of the distal left radius with DRU involvement, dorsal angulation and displacement.  Unclear if the fracture plane extends to the radiocarpal joint. 2.  Minimally to nondisplaced fractures of the left trapezium and distal scaphoid (avulsion) suspected. 3.  Comminuted fractures of the second and third metacarpals, suspected to be open fractures given suggestion of subcutaneous  gas at the dorsum of the hand. 4.  Ulnar styloid fracture.  Original Report Authenticated By: Harley Hallmark, M.D.   Dg Chest Port 1 View  06/19/2011  *RADIOLOGY REPORT*  Clinical Data: Fracture, preoperative radiograph  PORTABLE CHEST - 1 VIEW  Comparison: 06/26/2010  Findings: Coarse interstitial markings.  Peribronchial prominence may be chronic.  Mild left lung base opacity. Biapical  scarring. Nodular opacity projecting over each lower lobe presumably represent nipple shadows.  Appearance is similar to priors.  No acute osseous abnormality identified.  Multilevel degenerative changes.  IMPRESSION: COPD changes.  Mild left lung base opacity; favored to represent scarring versus atelectasis.  Original Report Authenticated By: Waneta Martins, M.D.   Dg Hand Complete Left  06/19/2011  *RADIOLOGY REPORT*  Clinical Data: 61 year old male with machine injury to the left upper extremity.  LEFT HAND - COMPLETE 3+ VIEW, LEFT FOREARM - 2 VIEW  Comparison: None.  Findings: Left forearm:  No definite left elbow joint effusion.  Distal left humerus and proximal left radius and ulna appear intact.  Severely comminuted distal left radius fracture extends from the distal radial ulnar joint proximally almost 6 cm.  Distal butterfly fragment shows dorsal angulation and displacement.  There is mild radial angulation and displacement. Associated mildly displaced left ulnar styloid fracture. Comminuted minimally-displaced left trapezium fracture.  Small fragment situated between the radial aspect of the scaphoid and trapezium may represent a small scaphoid avulsion.  Left hand:  Comminuted fractures of the distal third of the second and third left metacarpal.  Both shoulder ulnar angulation.  The third metacarpal fragment shows mild ulnar displacement.  Slight volar displacement. Other metacarpals appear intact.  No fracture of the phalanges identified.  Dorsal soft tissue swelling and subcutaneous gas.  IMPRESSION: 1.  Comminuted long segment fracture of the distal left radius with DRU involvement, dorsal angulation and displacement.  Unclear if the fracture plane extends to the radiocarpal joint. 2.  Minimally to nondisplaced fractures of the left trapezium and distal scaphoid (avulsion) suspected. 3.  Comminuted fractures of the second and third metacarpals, suspected to be open fractures given suggestion of  subcutaneous gas at the dorsum of the hand. 4.  Ulnar styloid fracture.  Original Report Authenticated By: Harley Hallmark, M.D.     No diagnosis found.  XR's, reviewed by me and discussed with both ortho and hand.  MDM  This previously well male presents following a crush injury to his hand.  The patient's x-rays are consistent with his deformities appreciated on physical exam.  Given the number of fractures, the concern for a fracture he was provided antibiotics.  The case was discussed with both orthopedics and hand surgery.  The patient was taken to the OR for further evaluation and management.       Gerhard Munch, MD 06/19/11 2328

## 2011-06-19 NOTE — ED Notes (Signed)
MD at bedside. 

## 2011-06-19 NOTE — ED Notes (Signed)
Pt has returned from xray

## 2011-06-19 NOTE — ED Notes (Addendum)
Per EMS:  Pt from work.  Got his arm caught in machinery, got his arm pinned in the machine.  10-15 minutes to remove machine from pts are.  Pt has sensation, movement, cap refill <3, pulses.  (L) arm has obvious wrist/forearm injury.  No other injuries, no LOC.  Scant amount of blood loss.  fentanyl given en route.

## 2011-06-19 NOTE — ED Notes (Signed)
Pt has obvious deformity to right forearm, wrist, and elbow area.

## 2011-06-20 ENCOUNTER — Encounter (HOSPITAL_COMMUNITY): Payer: Self-pay | Admitting: Orthopedic Surgery

## 2011-06-20 MED ORDER — ONDANSETRON HCL 4 MG PO TABS: 4.0000 mg | ORAL_TABLET | Freq: Four times a day (QID) | ORAL | Status: DC | PRN

## 2011-06-20 MED ORDER — HYDROCODONE-ACETAMINOPHEN 5-325 MG PO TABS
2.0000 | ORAL_TABLET | Freq: Four times a day (QID) | ORAL | Status: DC
  Administered 2011-06-20 – 2011-06-21 (×5): 2 via ORAL
  Filled 2011-06-20 (×5): qty 2

## 2011-06-20 MED ORDER — ONDANSETRON HCL 4 MG/2ML IJ SOLN
INTRAMUSCULAR | Status: DC | PRN
  Administered 2011-06-20: 4 mg via INTRAVENOUS

## 2011-06-20 MED ORDER — HYDROMORPHONE HCL PF 1 MG/ML IJ SOLN: 0.2500 mg | INTRAMUSCULAR | Status: DC | PRN

## 2011-06-20 MED ORDER — DIPHENHYDRAMINE HCL 12.5 MG/5ML PO ELIX
12.5000 mg | ORAL_SOLUTION | ORAL | Status: DC | PRN
  Filled 2011-06-20: qty 10

## 2011-06-20 MED ORDER — DROPERIDOL 2.5 MG/ML IJ SOLN
INTRAMUSCULAR | Status: DC | PRN
  Administered 2011-06-20: 0.625 mg via INTRAVENOUS

## 2011-06-20 MED ORDER — MEPERIDINE HCL 25 MG/ML IJ SOLN: 6.2500 mg | INTRAMUSCULAR | Status: DC | PRN

## 2011-06-20 MED ORDER — CEFAZOLIN SODIUM 1-5 GM-% IV SOLN
1.0000 g | Freq: Four times a day (QID) | INTRAVENOUS | Status: AC
  Administered 2011-06-20 (×3): 1 g via INTRAVENOUS
  Filled 2011-06-20 (×2): qty 50

## 2011-06-20 MED ORDER — DEXAMETHASONE SODIUM PHOSPHATE 4 MG/ML IJ SOLN
INTRAMUSCULAR | Status: DC | PRN
  Administered 2011-06-20: 4 mg via INTRAVENOUS

## 2011-06-20 MED ORDER — GLYCOPYRROLATE 0.2 MG/ML IJ SOLN
INTRAMUSCULAR | Status: DC | PRN
  Administered 2011-06-20: 0.2 mg via INTRAVENOUS

## 2011-06-20 MED ORDER — SUCCINYLCHOLINE CHLORIDE 20 MG/ML IJ SOLN
INTRAMUSCULAR | Status: DC | PRN
  Administered 2011-06-19: 100 mg via INTRAVENOUS

## 2011-06-20 MED ORDER — LACTATED RINGERS IV SOLN
INTRAVENOUS | Status: DC | PRN
  Administered 2011-06-20: via INTRAVENOUS

## 2011-06-20 MED ORDER — MORPHINE SULFATE 2 MG/ML IJ SOLN
2.0000 mg | INTRAMUSCULAR | Status: DC | PRN
  Administered 2011-06-20 – 2011-06-21 (×5): 2 mg via INTRAVENOUS
  Filled 2011-06-20 (×4): qty 1

## 2011-06-20 MED ORDER — HYDROCODONE-ACETAMINOPHEN 7.5-325 MG PO TABS: 1.0000 | ORAL_TABLET | ORAL | Status: DC | PRN

## 2011-06-20 MED ORDER — OXYCODONE HCL 5 MG PO TABS
5.0000 mg | ORAL_TABLET | ORAL | Status: DC | PRN
  Administered 2011-06-20 – 2011-06-21 (×5): 10 mg via ORAL
  Filled 2011-06-20 (×5): qty 2

## 2011-06-20 MED ORDER — BUPIVACAINE HCL (PF) 0.25 % IJ SOLN
INTRAMUSCULAR | Status: DC | PRN
  Administered 2011-06-20: 9 mL

## 2011-06-20 MED ORDER — PROMETHAZINE HCL 25 MG/ML IJ SOLN: 6.2500 mg | INTRAMUSCULAR | Status: DC | PRN

## 2011-06-20 MED ORDER — ONDANSETRON HCL 4 MG/2ML IJ SOLN: 4.0000 mg | Freq: Four times a day (QID) | INTRAMUSCULAR | Status: DC | PRN

## 2011-06-20 MED ORDER — HYDROCODONE-ACETAMINOPHEN 5-325 MG PO TABS: 1.5000 | ORAL_TABLET | ORAL | Status: DC | PRN

## 2011-06-20 MED ORDER — LIDOCAINE HCL 4 % MT SOLN
OROMUCOSAL | Status: DC | PRN
  Administered 2011-06-19: 4 mL via TOPICAL

## 2011-06-20 MED ORDER — 0.9 % SODIUM CHLORIDE (POUR BTL) OPTIME
TOPICAL | Status: DC | PRN
  Administered 2011-06-20: 1000 mL

## 2011-06-20 MED ORDER — MORPHINE SULFATE 2 MG/ML IJ SOLN
INTRAMUSCULAR | Status: AC
  Administered 2011-06-20: 2 mg via INTRAVENOUS
  Filled 2011-06-20: qty 1

## 2011-06-20 NOTE — Anesthesia Postprocedure Evaluation (Signed)
  Anesthesia Post-op Note  Patient: Dustin Walker  Procedure(s) Performed:  OPEN REDUCTION INTERNAL FIXATION (ORIF) ULNAR FRACTURE; IRRIGATION AND DEBRIDEMENT EXTREMITY; OPEN REDUCTION INTERNAL FIXATION (ORIF) HAND  Patient Location: PACU  Anesthesia Type: General  Level of Consciousness: awake and sedated  Airway and Oxygen Therapy: Patient Spontanous Breathing  Post-op Pain: moderate  Post-op Assessment: Post-op Vital signs reviewed  Post-op Vital Signs: stable  Complications: No apparent anesthesia complications

## 2011-06-20 NOTE — Op Note (Signed)
06/19/2011 - 06/20/2011  2:09 AM  PATIENT:  Dustin Walker  61 y.o. male  PRE-OPERATIVE DIAGNOSIS:  Multiple Fractures Left Hand; L distal radius/radial shaft; Left  2,3 Metacarpal fx's  POST-OPERATIVE DIAGNOSIS:  Multiple Fractures Left Hand; Same  PROCEDURE:  Procedure(s): OPEN REDUCTION INTERNAL FIXATION (ORIF) Distal radius/radial shaft FRACTURE IRRIGATION AND DEBRIDEMENT EXTREMITY OPEN REDUCTION INTERNAL FIXATION (ORIF) HAND Left 3rd metacarpal fracture; closed reduction of Left 2nd metacarpal fracture  SURGEON:  Surgeon(s): [name], MD  ANESTHESIA:   general  SPECIMEN:  No Specimen  FINDINGS:  Comminuted intraarticular fracture   PATIENT DISPOSITION:  PACU - hemodynamically stable.

## 2011-06-20 NOTE — Transfer of Care (Signed)
Immediate Anesthesia Transfer of Care Note  Patient: Dustin Walker  Procedure(s) Performed:  OPEN REDUCTION INTERNAL FIXATION (ORIF) ULNAR FRACTURE; IRRIGATION AND DEBRIDEMENT EXTREMITY; OPEN REDUCTION INTERNAL FIXATION (ORIF) HAND  Patient Location: PACU  Anesthesia Type: General  Level of Consciousness: sedated, patient cooperative and responds to stimulation  Airway & Oxygen Therapy: Patient Spontanous Breathing and Patient connected to nasal cannula oxygen  Post-op Assessment: Report given to PACU RN, Post -op Vital signs reviewed and stable and Patient moving all extremities  Post vital signs: Reviewed and stable  Complications: No apparent anesthesia complications

## 2011-06-20 NOTE — Progress Notes (Signed)
S:pt states hand started hurting worse this am, with some altered sensation in fingers.  Pt has also not been abled to urinate on his own - had to be cath'd this am - 750cc.  O:Blood pressure 114/61, pulse 61, temperature 97.6 F (36.4 C), temperature source Oral, resp. rate 18, SpO2 98.00%. Results for orders placed in visit on 06/26/10  SURGICAL PCR SCREEN     Status: Normal   Collection Time   06/26/10  9:35 AM      Component Value Range Status Comment   MRSA, PCR NEGATIVE  NEGATIVE  Final    Staphylococcus aureus    NEGATIVE  Final    Value: NEGATIVE            The Xpert SA Assay (FDA     approved for NASAL specimens     only), is one component of     a comprehensive surveillance     program.  It is not intended     to diagnose infection nor to     guide or monitor treatment.   Dressing removed - L hand with actually decreased swelling, able to move all fingers, fingertips warm with good cap refill,  Sensation improved after dressing removed, wounds look satisfactory, forearm compartmen, hand compartments compressible, no severe pain with active or passive movement of fingers to suggest compartment syndrome.  A:pod #1 ORIF radius, metacarpal   P:dressing changed, cont elevation, pain control, will monitor urine function; with still concern for compartment syndrome and inability to void, will keep patient for another day.

## 2011-06-20 NOTE — Progress Notes (Signed)
UR of chart complete.  

## 2011-06-20 NOTE — Op Note (Signed)
NAME:  GAYNOR, FERRERAS             ACCOUNT NO.:  [id number]  MEDICAL RECORD NO.:  [id number]  LOCATION:  5031                         FACILITY:  [facility]  PHYSICIAN:  [name], MD    DATE OF BIRTH:  1950/11/22  DATE OF PROCEDURE:  06/19/2011 DATE OF DISCHARGE:                              OPERATIVE REPORT   PREOPERATIVE DIAGNOSES: 1. Comminuted interarticular distal radius and radial shaft fracture. 2. Ulnar styloid fracture. 3. Left second and third metacarpal fractures.  POSTOPERATIVE DIAGNOSES: 1. Comminuted interarticular distal radius and radial shaft fracture. 2. Ulnar styloid fracture. 3. Left second and third metacarpal fractures.  PROCEDURES: 1. Open reduction and internal fixation of the left distal radius and     radial shaft with an extended Stryker volar plate. 2. Volar forearm fasciotomy. 3. Open reduction and internal fixation of the left third metacarpal     with a 1.6 mm IM nail. 4. Closed reduction of the left second metacarpal. 5. Nonoperative treatment of the left trapezium and questionable     scaphoid fracture.  ANESTHESIA:  General.  TOURNIQUET TIME:  A little over an hour.  COMPLICATIONS:  No acute complications.  INDICATIONS:  [name] is a gentleman that was at work and got his hand caught in some sort of roller conveyor belt apparatus crushing his hand and distal forearm.  He presented to the emergency room with obvious swelling and deformity and pain.  On x-ray examination, he had the above-mentioned fractures.  I was consulted.  His hand was quite edematous.  He was grossly neurologically intact without any significant numbness to his fingers, however, swelling was significant.  Risks, benefits, alternatives of open reduction and internal fixation were discussed with the patient and his significant other, including but not limited to, bleeding, infection, nerve injury, postoperative stiffness, and postoperative pain.  They agreed to  proceed with surgery.  Consent was obtained.  PROCEDURE:  The patient was taken to the operating room and placed supine on the operating table.  General anesthesia was administered.  A time-out was performed.  Preoperative antibiotics were given in the emergency room just prior to arrival in the OR.  No additional antibiotics were given.  The left upper extremity was prepped and draped in normal sterile fashion.  The arm was exsanguinated, and the tourniquet was inflated to 250 mmHg.  A volar incision was made overlying the FCR tendon.  Dissection was carried down.  There was quite a bit of hematoma that was evacuated.  Due to the nature of the swelling, a more extensive fasciotomy more proximally was performed. The interval between the FCR tendon and radial artery was dissected. The fracture was comminuted in multiple places in the radial shaft and in the distal radius.  The trauma from the fracture had dissected and torn much of the pronator quadratus muscle from the actual fracture site.  Fracture site was then evaluated and the more proximal radial shaft fracture line was measured from the distal radius, and surprisingly an extended shaft anatomic Stryker VariAx volar plate was chosen and felt to have adequate length and coverage of all fracture sites.  The plate was temporarily held in place with K-wires.  X-ray  confirmed good plate placement.  Next, the radial shaft and screws were each drilled, measured and appropriate size cortical screws were placed. Following additional reduction of the distal radius was performed, and locking screws were placed.  Because of the distal nature of the plate, the central and the ulnar corner screws were felt to be proud and therefore removed.  The fracture was felt overall stable with the remaining screws, and therefore these were not replaced.  The ulnar styloid although not perfectly anatomic was reduced into position with fixation of the  fracture and therefore no additional fixation of this was performed.  Afterwards, the wound was irrigated.  There was significant swelling of the wound.  The fasciotomy was left open.  The deep dermal layers were approximated with interrupted 3-0 Vicryl, and the skin was actually closed with staples.  Next, the metacarpal fractures were evaluated.  The proximal portion of the third metacarpal was marked and x-rayed.  A small longitudinal incision was made overlying the base of this.  Blunt dissection was carried down to the base of the metacarpal sweeping the tendon aside and with an intramedullary introducer and guidewire awl, the dorsal cortex was pierced, and then the intramedullary nail was placed in the medullary canal and advanced to the fracture site.  Inline traction of the fingers was performed  to allow the intramaxillary nail to pass over the fracture site into the metacarpal head, reducing the fracture satisfactorily.  Following, the nail was bent to approximately 45 degrees and cut.  Manual impaction of the fracture site was then performed.  Once this was performed, the second metacarpal fracture appeared satisfactorily reduced after reducing the third, and therefore no additional fixation was performed.  Other x-ray views of the wrist revealed a small chip fracture, probably the trapezium and may be the distal portion of the scaphoid.  These fragments were very, very small and did not feel that any additional fixation was necessary.  All wounds were dressed  and a sterile dressing and a sugar-tong splint with the wrist in slight supination was performed just prior to closure of the distal radius when the tourniquet was released and hemostasis was complete.     [name], MD     HCC/MEDQ  D:  06/20/2011  T:  06/20/2011  Job:  [id number]

## 2011-06-21 NOTE — Progress Notes (Signed)
S: pt feeling better this am, able to void, pain controlled, no other complaints  O:Blood pressure 104/50, pulse 52, temperature 99 F (37.2 C), temperature source Oral, resp. rate 20, SpO2 94.00%. Results for orders placed in visit on 06/26/10  SURGICAL PCR SCREEN     Status: Normal   Collection Time   06/26/10  9:35 AM      Component Value Range Status Comment   MRSA, PCR NEGATIVE  NEGATIVE  Final    Staphylococcus aureus    NEGATIVE  Final    Value: NEGATIVE            The Xpert SA Assay (FDA     approved for NASAL specimens     only), is one component of     a comprehensive surveillance     program.  It is not intended     to diagnose infection nor to     guide or monitor treatment.   L Hand:  Decreased swelling, able to move fingers, finger tips warm, with good cap refill, neuro intact  A:pod#2 ORIF L hand   P: will d/c home, cont elevation, nwb, gently move fingers; f/u in office next wk

## 2011-06-21 NOTE — Discharge Summary (Signed)
NAME:  Dustin Walker, Dustin Walker             ACCOUNT NO.:  [id number]  MEDICAL RECORD NO.:  [id number]  LOCATION:  5031                         FACILITY:  [facility]  PHYSICIAN:  [name], MD    DATE OF BIRTH:  [birth year]  DATE OF ADMISSION:  06/19/2011 DATE OF DISCHARGE:  06/21/2011                              DISCHARGE SUMMARY   REASON FOR ADMISSION:  Crush injury to left upper extremity, specifically fracture of the left radius, fracture of multiple carpal bones, fracture of multiple metacarpals, left hand.  DISCHARGE SUMMARY:  [name] is a pleasant gentleman who was injured while working, getting his left upper extremity caught in a machine.  He presented to the emergency room with the above mentioned fractures.  I was consulted on the day of injury.  He was taken to the operating room for repair of his fractures.  Please see the operative note for full details.  Postoperatively, he was admitted to the hospital for monitoring of his crushed extremity as well as postoperative pain control.  On postoperative day 1, he had significant swelling of his hand and forearm suspicious for compartment syndrome.  His dressing was removed and changed.  Once this was performed, the patient's pain was better under control.  The patient was kept an additional day because of the nature of his swelling as well as his inability to void on his own. On the day of discharge, the patient's swelling of his left hand was significantly decreased.  His sensation was essentially normal, so there was no concern for compartment syndrome.  His urinary function was back to baseline.  DISCHARGE INSTRUCTIONS:  The patient will be sent home.  He will keep the left upper extremity elevated.  He is to gently move his fingers. He is nonweightbearing of the left upper extremity.  He may return to work with these restrictions.  His diet is regular.  His activity is as mentioned above only limitation to the left upper  extremity.  POSTOPERATIVE MEDICATIONS:  He was sent home with a prescription for Keflex 500 mg 1 tablet p.o. 4 times a day for 7 days and a prescription for Norco 7.5 mg 1-2 tablets p.o. q.6 h. p.r.n. for pain.  He is to call the office next week and make an appointment to see me next week for wound evaluation and revision of splint.     [name], MD     HCC/MEDQ  D:  06/21/2011  T:  06/21/2011  Job:  [id number]

## 2011-06-24 ENCOUNTER — Encounter (HOSPITAL_COMMUNITY): Payer: Self-pay | Admitting: General Surgery

## 2011-09-01 ENCOUNTER — Other Ambulatory Visit: Payer: Self-pay | Admitting: General Surgery

## 2011-09-01 DIAGNOSIS — S6990XA Unspecified injury of unspecified wrist, hand and finger(s), initial encounter: Secondary | ICD-10-CM

## 2011-09-03 ENCOUNTER — Ambulatory Visit
Admission: RE | Admit: 2011-09-03 | Discharge: 2011-09-03 | Disposition: A | Discharge status: Home / Self Care | Payer: Worker's Compensation | Source: Ambulatory Visit | Attending: General Surgery | Admitting: General Surgery

## 2011-09-03 ENCOUNTER — Other Ambulatory Visit: Payer: Self-pay | Admitting: General Surgery

## 2011-09-03 DIAGNOSIS — S6990XA Unspecified injury of unspecified wrist, hand and finger(s), initial encounter: Secondary | ICD-10-CM

## 2012-04-19 ENCOUNTER — Ambulatory Visit: Payer: Self-pay | Admitting: Pain Medicine

## 2012-06-17 ENCOUNTER — Ambulatory Visit: Payer: Self-pay | Admitting: Pain Medicine

## 2012-06-30 ENCOUNTER — Ambulatory Visit: Payer: Self-pay | Admitting: Pain Medicine

## 2012-07-15 ENCOUNTER — Ambulatory Visit: Payer: Self-pay | Admitting: Pain Medicine

## 2012-09-13 ENCOUNTER — Ambulatory Visit: Payer: Self-pay | Admitting: Pain Medicine

## 2012-10-25 ENCOUNTER — Encounter: Payer: Self-pay | Admitting: *Deleted

## 2012-11-16 ENCOUNTER — Ambulatory Visit: Payer: Self-pay | Admitting: Pain Medicine

## 2012-11-23 ENCOUNTER — Ambulatory Visit: Payer: Self-pay | Admitting: Pain Medicine

## 2013-02-09 ENCOUNTER — Ambulatory Visit: Payer: Self-pay | Admitting: Pain Medicine

## 2013-03-01 ENCOUNTER — Ambulatory Visit: Payer: Self-pay | Admitting: Pain Medicine

## 2013-03-14 ENCOUNTER — Ambulatory Visit: Payer: Self-pay | Admitting: Pain Medicine

## 2013-03-15 IMAGING — CR DG FOREARM 2V*L*
2 series · 2 of 2 positions shown · non-contrast
Comparison: None.

CLINICAL DATA: 60-year-old male with machine injury to the left
upper extremity.

LEFT HAND - COMPLETE 3+ VIEW,
LEFT FOREARM - 2 VIEW

[x forearm ap left]
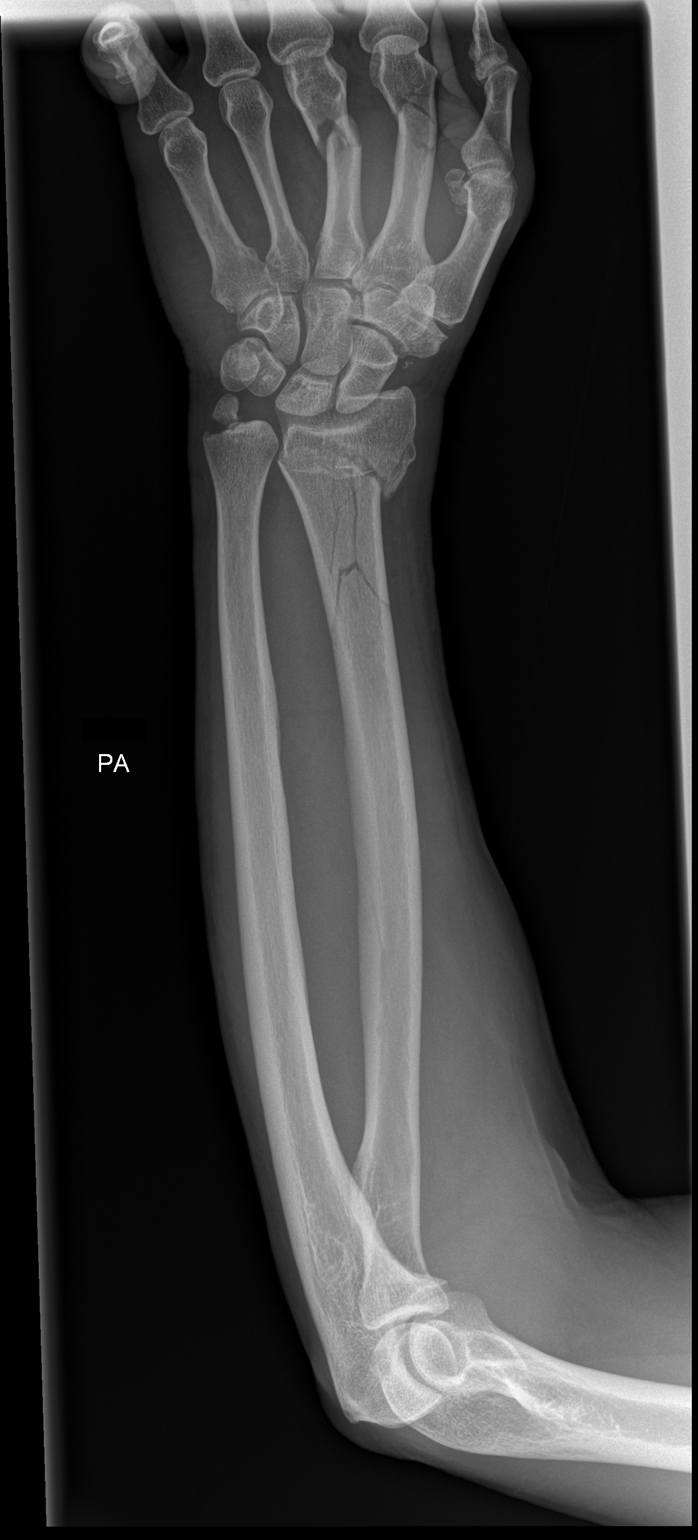

[x forearm lat left]
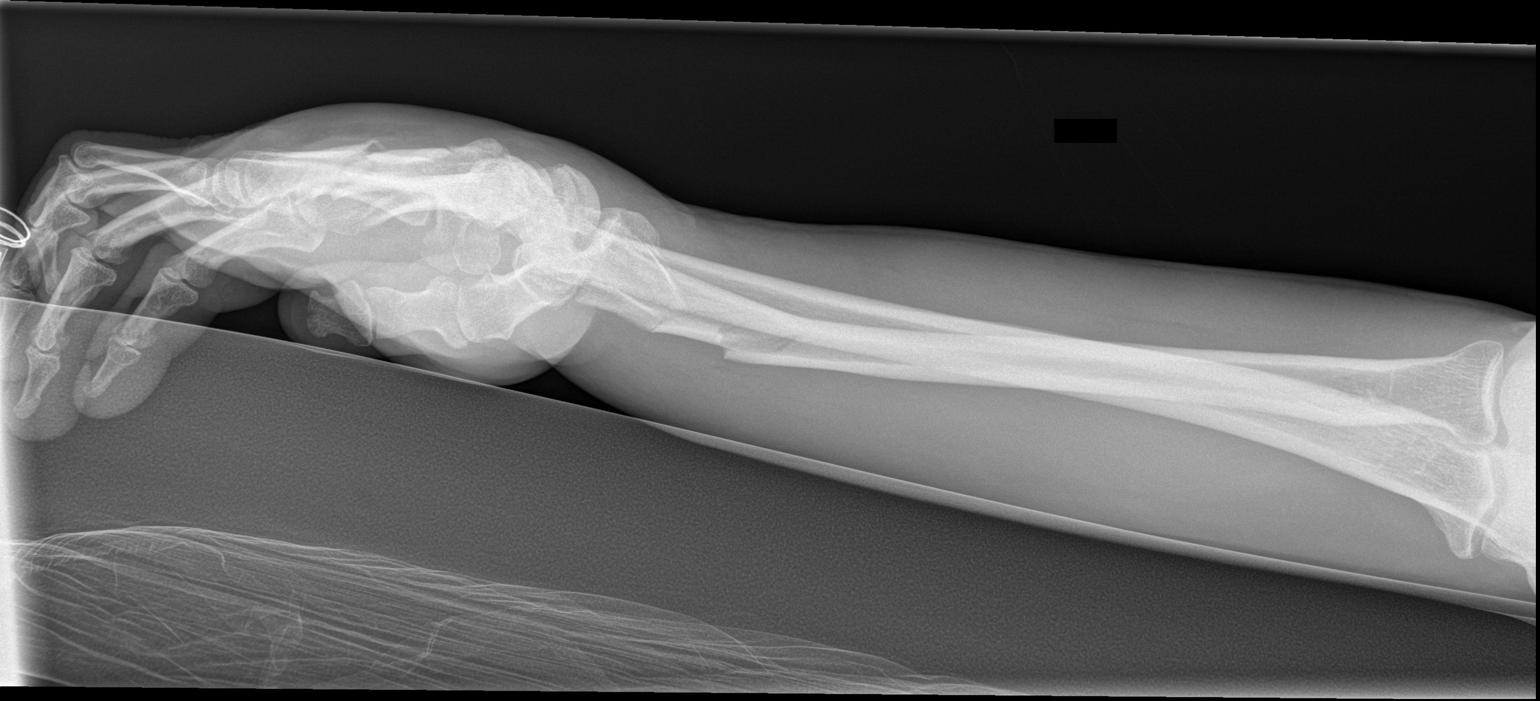

[2 of 2 positions shown; findings below may reference images not displayed]

FINDINGS: Left forearm:  No definite left elbow joint effusion.  Distal left
humerus and proximal left radius and ulna appear intact.

Severely comminuted distal left radius fracture extends from the
distal radial ulnar joint proximally almost 6 cm.  Distal butterfly
fragment shows dorsal angulation and displacement.  There is mild
radial angulation and displacement.
Associated mildly displaced left ulnar styloid fracture.
Comminuted minimally-displaced left trapezium fracture.  Small
fragment situated between the radial aspect of the scaphoid and
trapezium may represent a small scaphoid avulsion.

Left hand:  Comminuted fractures of the distal third of the second
and third left metacarpal.  Both shoulder ulnar angulation.  The
third metacarpal fragment shows mild ulnar displacement.  Slight
volar displacement.
Other metacarpals appear intact.  No fracture of the phalanges
identified.  Dorsal soft tissue swelling and subcutaneous gas.
IMPRESSION: 1.  Comminuted long segment fracture of the distal left radius with
GIUPY involvement, dorsal angulation and displacement.  Unclear if
the fracture plane extends to the radiocarpal joint.
2.  Minimally to nondisplaced fractures of the left trapezium and
distal scaphoid (avulsion) suspected.
3.  Comminuted fractures of the second and third metacarpals,
suspected to be open fractures given suggestion of subcutaneous gas
at the dorsum of the hand.
4.  Ulnar styloid fracture.

## 2013-03-15 IMAGING — CR DG HAND COMPLETE 3+V*L*
3 series · 3 of 3 positions shown · non-contrast
Comparison: None.

CLINICAL DATA: 60-year-old male with machine injury to the left
upper extremity.

LEFT HAND - COMPLETE 3+ VIEW,
LEFT FOREARM - 2 VIEW

[x hand pa left]
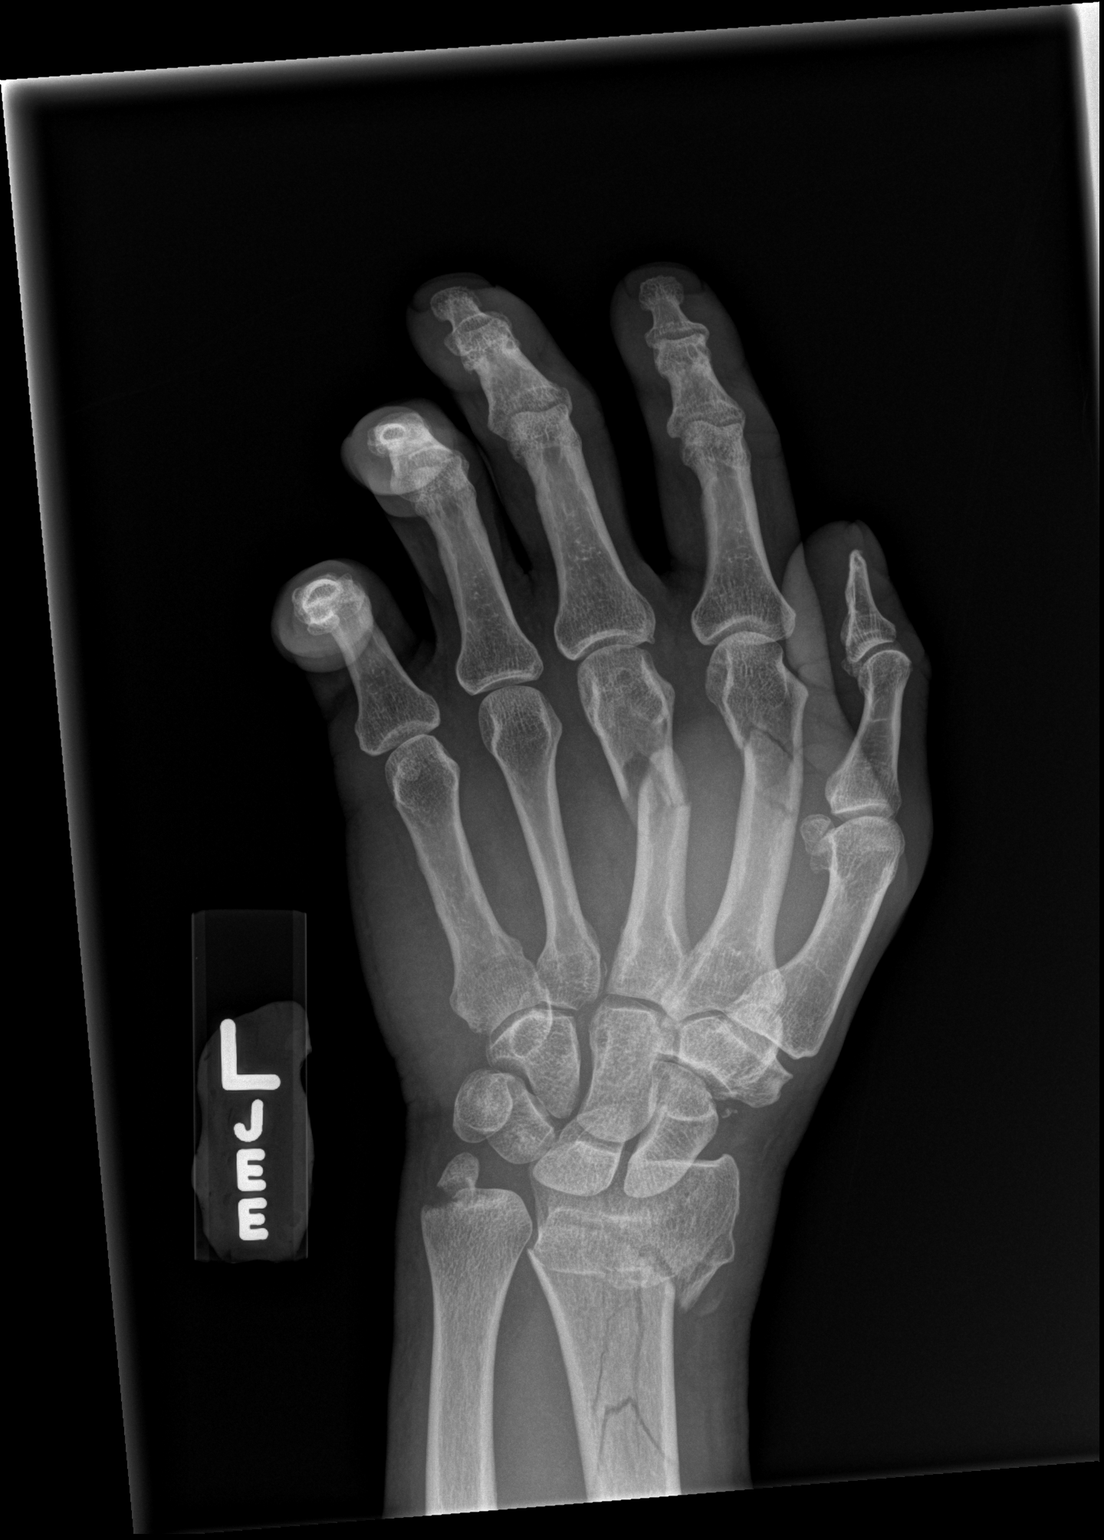

[x hand obl left]
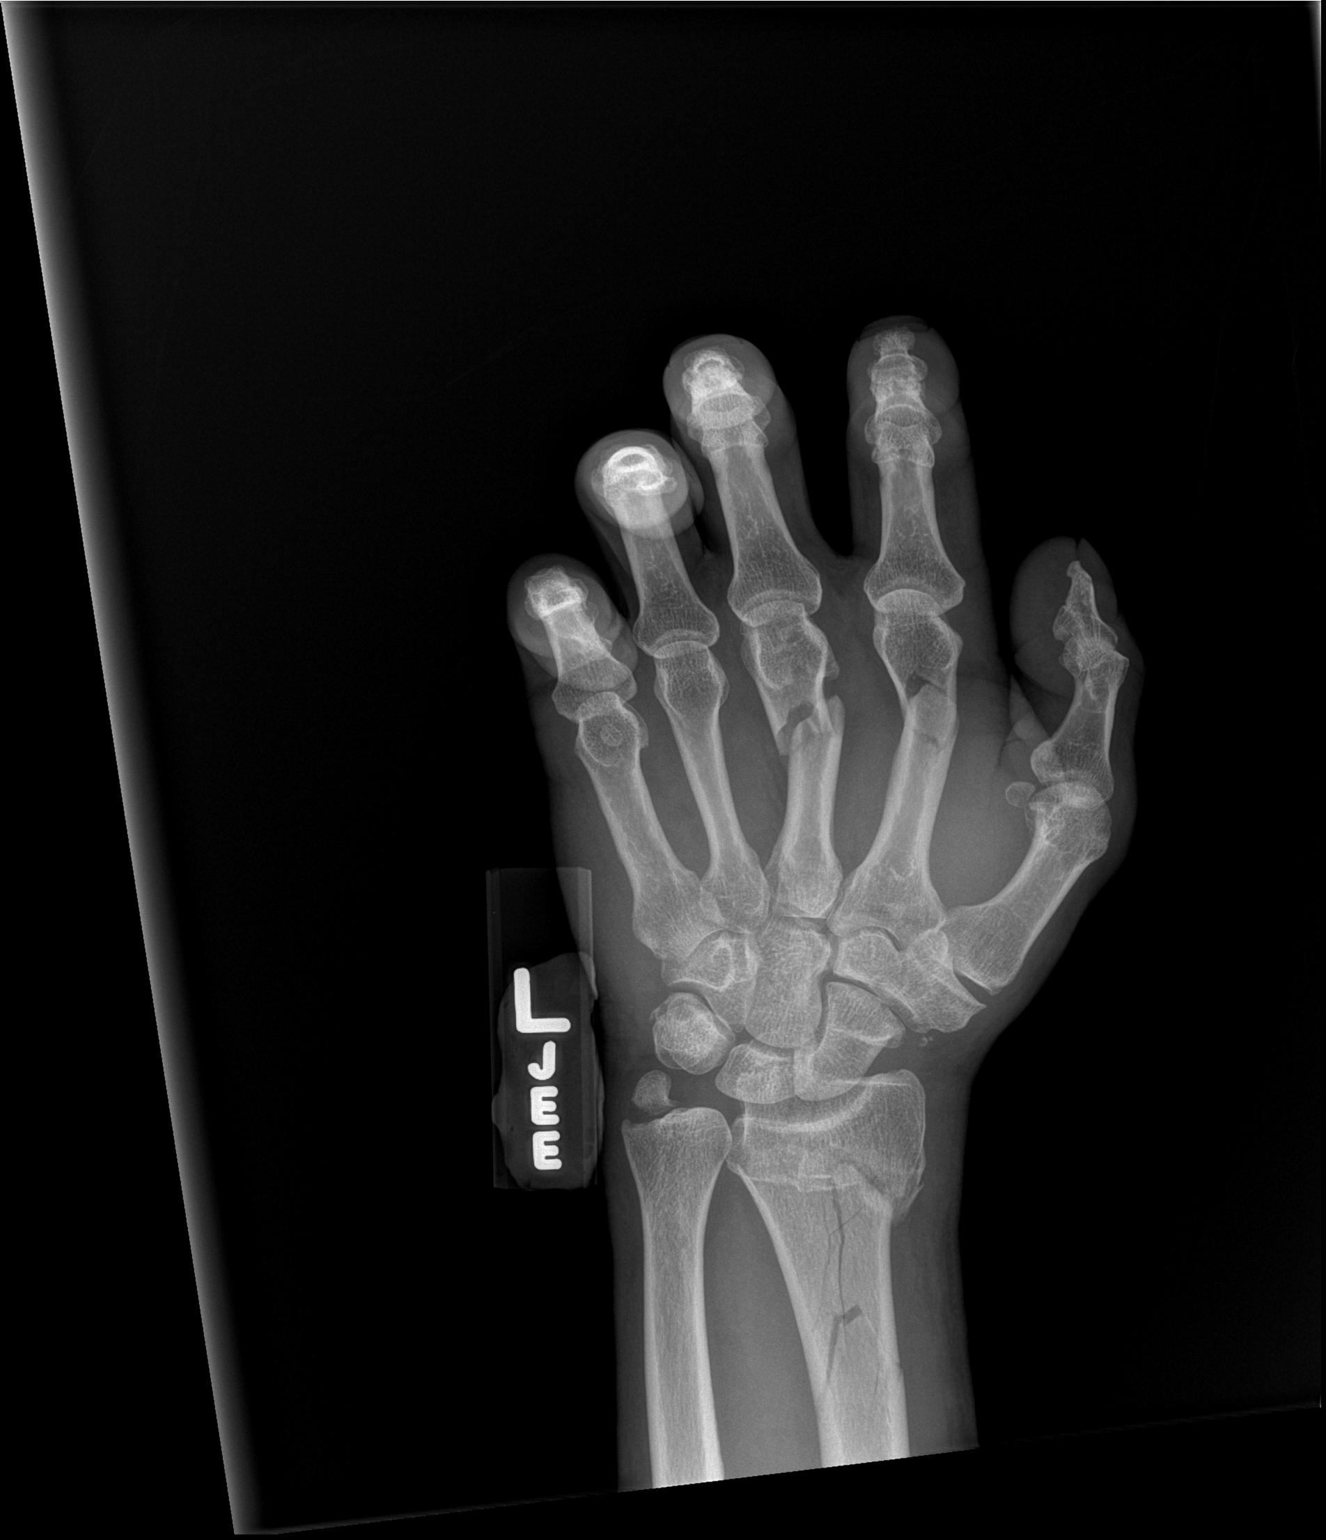

[x hand lat left]
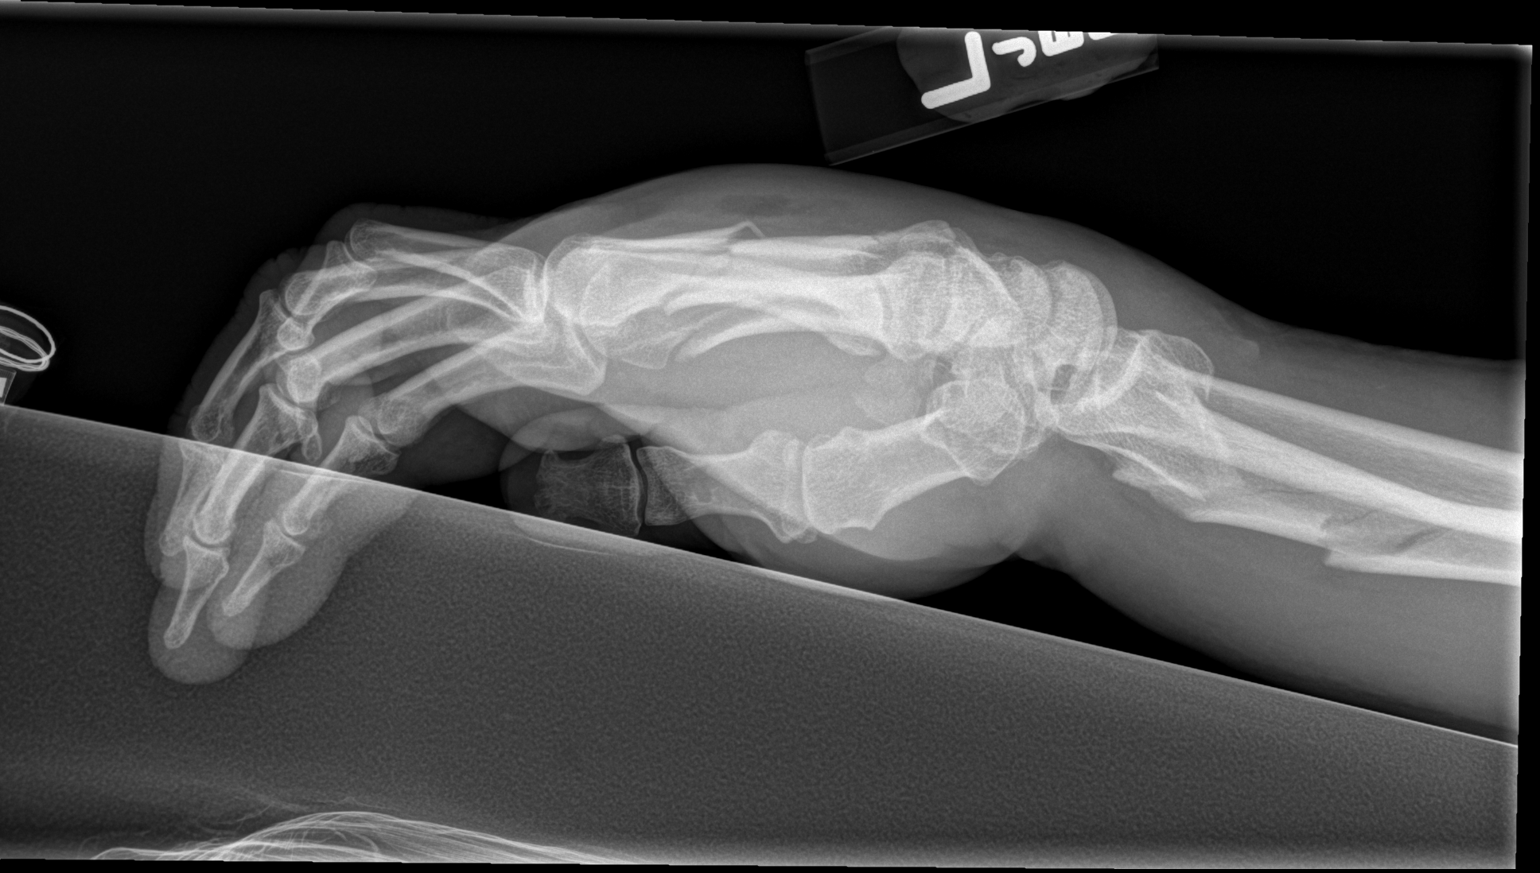

[3 of 3 positions shown; findings below may reference images not displayed]

FINDINGS: Left forearm:  No definite left elbow joint effusion.  Distal left
humerus and proximal left radius and ulna appear intact.

Severely comminuted distal left radius fracture extends from the
distal radial ulnar joint proximally almost 6 cm.  Distal butterfly
fragment shows dorsal angulation and displacement.  There is mild
radial angulation and displacement.
Associated mildly displaced left ulnar styloid fracture.
Comminuted minimally-displaced left trapezium fracture.  Small
fragment situated between the radial aspect of the scaphoid and
trapezium may represent a small scaphoid avulsion.

Left hand:  Comminuted fractures of the distal third of the second
and third left metacarpal.  Both shoulder ulnar angulation.  The
third metacarpal fragment shows mild ulnar displacement.  Slight
volar displacement.
Other metacarpals appear intact.  No fracture of the phalanges
identified.  Dorsal soft tissue swelling and subcutaneous gas.
IMPRESSION: 1.  Comminuted long segment fracture of the distal left radius with
GIUPY involvement, dorsal angulation and displacement.  Unclear if
the fracture plane extends to the radiocarpal joint.
2.  Minimally to nondisplaced fractures of the left trapezium and
distal scaphoid (avulsion) suspected.
3.  Comminuted fractures of the second and third metacarpals,
suspected to be open fractures given suggestion of subcutaneous gas
at the dorsum of the hand.
4.  Ulnar styloid fracture.

## 2013-03-21 ENCOUNTER — Ambulatory Visit (INDEPENDENT_AMBULATORY_CARE_PROVIDER_SITE_OTHER): Payer: BC Managed Care – PPO | Admitting: Family Medicine

## 2013-03-21 ENCOUNTER — Encounter: Payer: Self-pay | Admitting: Family Medicine

## 2013-03-21 VITALS — BP 110/70 | Temp 98.8°F | Ht 68.0 in | Wt 143.4 lb

## 2013-03-21 DIAGNOSIS — R05 Cough: Secondary | ICD-10-CM

## 2013-03-21 DIAGNOSIS — J441 Chronic obstructive pulmonary disease with (acute) exacerbation: Secondary | ICD-10-CM

## 2013-03-21 MED ORDER — CEFTRIAXONE SODIUM 1 G IJ SOLR
500.0000 mg | Freq: Once | INTRAMUSCULAR | Status: AC
  Administered 2013-03-21: 500 mg via INTRAMUSCULAR

## 2013-03-21 MED ORDER — ALBUTEROL SULFATE HFA 108 (90 BASE) MCG/ACT IN AERS: 2.0000 | INHALATION_SPRAY | Freq: Four times a day (QID) | RESPIRATORY_TRACT | Status: DC | PRN

## 2013-03-21 MED ORDER — LEVOFLOXACIN 500 MG PO TABS: 500.0000 mg | ORAL_TABLET | Freq: Every day | ORAL | Status: AC

## 2013-03-21 NOTE — Progress Notes (Signed)
  Subjective:    Patient ID: Dustin Walker, male    DOB: 19-Mar-1951, 62 y.o.   MRN: [id number]  Cough This is a new problem. The current episode started 1 to 4 weeks ago. The problem has been gradually worsening. The problem occurs constantly. The cough is productive of sputum. Associated symptoms include nasal congestion. Associated symptoms comments: Chest congestion. Nothing aggravates the symptoms. Treatments tried: vitamin c  The treatment provided no relief.   Coughing up phlegm. No fever  Chest pain sharp left sided worse with deep breath and motion  Taking many otcs  Cough bad dy and night  Review of Systems  Respiratory: Positive for cough.    no vomiting no diarrhea no rash ROS otherwise negative     Objective:   Physical Exam  Alert no apparent distress. Mild malaise. Intermittent cough during exam. Breast sounds diminished diffusely rare wheeze. No crackles no tachypnea heart regular in rhythm.      Assessment & Plan:  Impression acute bronchitis with exacerbation of COPD. Plan Levaquin daily 10 days. Rocephin shot to start. Hycodan each bedtime when necessary for cough. Symptomatic care discussed. Stop smoking WSL

## 2013-12-15 ENCOUNTER — Encounter: Payer: Self-pay | Admitting: Family Medicine

## 2013-12-15 ENCOUNTER — Ambulatory Visit (INDEPENDENT_AMBULATORY_CARE_PROVIDER_SITE_OTHER): Payer: 59 | Admitting: Family Medicine

## 2013-12-15 VITALS — BP 110/68 | Ht 68.0 in | Wt 134.0 lb

## 2013-12-15 DIAGNOSIS — M19132 Post-traumatic osteoarthritis, left wrist: Secondary | ICD-10-CM

## 2013-12-15 DIAGNOSIS — M12539 Traumatic arthropathy, unspecified wrist: Secondary | ICD-10-CM

## 2013-12-15 MED ORDER — OXYCODONE-ACETAMINOPHEN 5-325 MG PO TABS: ORAL_TABLET | ORAL | Status: DC

## 2013-12-15 MED ORDER — DICLOFENAC SODIUM 75 MG PO TBEC: 75.0000 mg | DELAYED_RELEASE_TABLET | Freq: Two times a day (BID) | ORAL | Status: DC

## 2013-12-15 NOTE — Progress Notes (Signed)
   Subjective:    Patient ID: Dustin Walker, male    DOB: 03/23/1951, 63 y.o.   MRN: [id number]  Hand Pain  He has tried nothing for the symptoms.  pt states he injured hand 2 and a half years ago. Pain started about 1 month ago when he started to use left hand again.   Hand injury under workmen's comp and had a pay off for it  The whole hand hurts, had multiplre fractures in fingers hand metacarpals and wrist  Has permanent plat   Taking no meds at all Breathing doesn't bothr him  Still smoking lbut less  Underwent phys therapy for a yr    Review of Systems No headache no chest pain no back pain no abdominal pain no change in bowel habits    Objective:   Physical Exam Alert no acute distress. Vital stable. Lungs diminished breath sounds diffusely heart regular in rhythm. Left hand some deformity noted. Significant scarring noted. Some atrophy of hand musculature noted. Strength decent.       Assessment & Plan:  Impression posttraumatic hand pain. With neuropathic element. Worse in the evening after doing something. Had major hand surgery after industrial accident in a roller. Also had protracted physical therapy and in fact was eventually declared disabled and given the Workmen's Comp. payment #2 COPD severe. Unfortunately ongoing smoking. Discussed at length. plan oxycodone to use in the evening primarily sparingly. Voltaren as needed for flare of pain. One is exam encourage. Stopping smoking encourage. WSL

## 2013-12-18 DIAGNOSIS — M19139 Post-traumatic osteoarthritis, unspecified wrist: Secondary | ICD-10-CM | POA: Insufficient documentation

## 2014-02-20 ENCOUNTER — Telehealth: Payer: Self-pay | Admitting: Internal Medicine

## 2014-03-14 ENCOUNTER — Encounter: Payer: Self-pay | Admitting: Family Medicine

## 2014-03-14 ENCOUNTER — Ambulatory Visit (INDEPENDENT_AMBULATORY_CARE_PROVIDER_SITE_OTHER): Payer: 59 | Admitting: Family Medicine

## 2014-03-14 VITALS — BP 120/78 | Ht 67.0 in | Wt 132.2 lb

## 2014-03-14 DIAGNOSIS — M12532 Traumatic arthropathy, left wrist: Secondary | ICD-10-CM

## 2014-03-14 DIAGNOSIS — M19132 Post-traumatic osteoarthritis, left wrist: Secondary | ICD-10-CM

## 2014-03-14 MED ORDER — OXYCODONE-ACETAMINOPHEN 5-325 MG PO TABS: ORAL_TABLET | ORAL | Status: DC

## 2014-03-14 NOTE — Patient Instructions (Signed)
Generic miralax one scoop daily in a glass of water can help constipation  As part of your visit today we have covered your chronic pain. You have been given prescription(s) for pain medicines.The DEA and [place] require that any patient on pain medications must be seen every 3 months. You are expected to come in for a office visit before further pain medications are issued.   We will not refill medications or early nor will we give an extended month supply at the end of these prescriptions.It is your responsibility to keep up with medications. They will not be replaced.  It is your responsibility to schedule an office visit in 3-4 months to be seen before you are out of your medication. Do not call our office to request early refills or additional refills. Do not wait till the last moment to schedule the follow up visit. We highly recommend you schedule this now for 3 months.  We believe that most patients take their meds as prescribed but drug misuse and diversion is a serious problem in the [place]. Our office does standard measures to insure proper care to all. All patients are subject to random urine drug screens and random pill counts. Also all patients drug prescription records are reviewed on a regular basis in accordance with [facility] medical board policies.  Remember, do not use alcohol or illegal drugs with your pain medications.    We are required by law to adhere to strict regulations. Failure on our part to follow these regulations could jeopardize our prescription license which in turn would cause [place] not to be able to care for you.Thank you for your understanding and following these policies.

## 2014-03-14 NOTE — Progress Notes (Signed)
   Subjective:    Patient ID: Dustin Walker, male    DOB: 1951-01-07, 63 y.o.   MRN: [id number]  HPI  Patient arrives with continued pain in left hand and is currently out of his pain meds.  May start flu vaccine next yr not this  Breathing overall better, still cutting back on the smoking    No sig change with hand, ongoing pain  Burning at times  Seems to worsen with the cold wether  Hands stiff  Some constip with the pain med  s Review of Systems No headache no chest pain no back pain some constipation with pain medicine    Objective:   Physical Exam Alert no apparent distress HEENT normal. Lungs clear heart rare rhythm. And significant scarring significant pain and swelling mid hand joint left side       Assessment & Plan:  Impression chronic hand pain #2 COPD patient declines flu shot plan narcotic meds refilled. Symptomatic care discussed. Neuro lax when necessary. Follow-up every 3 months. WSL

## 2014-05-30 DIAGNOSIS — Z029 Encounter for administrative examinations, unspecified: Secondary | ICD-10-CM

## 2014-06-06 ENCOUNTER — Ambulatory Visit (INDEPENDENT_AMBULATORY_CARE_PROVIDER_SITE_OTHER): Payer: 59 | Admitting: Family Medicine

## 2014-06-06 ENCOUNTER — Encounter: Payer: Self-pay | Admitting: Family Medicine

## 2014-06-06 VITALS — BP 112/74 | Ht 67.0 in | Wt 135.0 lb

## 2014-06-06 DIAGNOSIS — M19132 Post-traumatic osteoarthritis, left wrist: Secondary | ICD-10-CM

## 2014-06-06 DIAGNOSIS — M12532 Traumatic arthropathy, left wrist: Secondary | ICD-10-CM

## 2014-06-06 DIAGNOSIS — J441 Chronic obstructive pulmonary disease with (acute) exacerbation: Secondary | ICD-10-CM

## 2014-06-06 MED ORDER — OXYCODONE-ACETAMINOPHEN 5-325 MG PO TABS: ORAL_TABLET | ORAL | Status: DC

## 2014-06-06 NOTE — Progress Notes (Signed)
   Subjective:    Patient ID: Dustin Walker, male    DOB: Nov 20, 1950, 64 y.o.   MRN: [id number]  HPI This patient was seen today for chronic pain  The medication list was reviewed and updated.   -Compliance with pain medication: Yes  The patient was advised the importance of maintaining medication and not using illegal substances with these.  Refills needed: Yes, 3 months. Last filled 05/12/14  The patient was educated that we can provide 3 monthly scripts for their medication, it is their responsibility to follow the instructions.  Side effects or complications from medications: No  Patient is aware that pain medications are meant to minimize the severity of the pain to allow their pain levels to improve to allow for better function. They are aware of that pain medications cannot totally remove their pain.  Due for UDT ( at least once per year) :      Breathing surprisingly good  Inhaler only once or twice per wk   This winter wrist and hand and knuckles have been more zching and painful. Little better this pas t mo.  Taking two pain meds per day is doable but not great.    Review of Systems No headache no chest pain no nausea no diaphoresis no change in bowel habits    Objective:   Physical Exam Alert no apparent distress. Lungs no wheezes currently heart regular in rhythm left hand significant deformity. Diminished strength. Diminished range of motion.       Assessment & Plan:  Impression chronic posttraumatic arthritis/pain #2 COPD plan encouraged to stop smoking Ventolin when necessary. Pain medications refilled. Recheck in 3 months. WSL

## 2014-06-06 NOTE — Patient Instructions (Signed)

## 2014-09-01 NOTE — Op Note (Signed)
PATIENT NAME:  Dustin Walker, Dustin Walker MR#:  [id number] DATE OF BIRTH:  08-08-1950  DATE OF PROCEDURE:  03/01/2013  Location: Operating Room Referring Physician: Carroll Kinds [phone]). Procedure by: Kathlen Brunswick. Dossie Arbour, M.D.  This is a Copy case.   Primary Care Physician:  Mickie Hillier [phone])  Caseworker:  Moishe Spice, MF, [facility] [phone])  Note:  This is the case of a 64 year old white male patient with a history significant for pain in the left hand, secondary to a crush injury at work. The patient unfortunately developed what seems to be a complex regional pain syndrome of that left upper extremity. A spinal cord stimulator trial had been performed, with excellent results on 11/16/2012. The patient comes into same-day surgery today for permanent placement of a device.   Procedure(s):  1. Permanent implantation of Cervical Epidural, Single Percutaneous Neurostimulator Lead (8 electrode array). 2. Implantation of Epidural Neurostimulator Generator. 3. Fluoroscopic Needle Guidance 4. Intraoperative Analysis and Programming. 5. Postoperative Analysis and Programming. 6. Moderate Conscious Sedation by the [facility] Anesthesia Team.  Surgeon: Kathlen Brunswick. Dossie Arbour, M.D. Side of implant: Left side Top electrode tip level: Top of C3 cervical vertebral body.  Diagnostic Indications:  1.  Chronic pain syndrome (G89.4).  2.  Neuralgia/neuritis, unspecified (M79.2). 3.  Upper extremity complex regional pain syndrome, type I (G90.519).   Position: Prone.  Prepping solution: DuraPrep Area prepped: The Cervical, thoracic, and lumbosacral areas, were prepped with a broad-spectrum topical antiseptic microbicide. Target area: Cervical Epidural Space, around the C2/C3 vertebral body, for the electrode tip. Insertion site is the T1-3 intervertebral space. Level entered: T2-3 Number of attempts: one  Infection Control: Standard Universal Precautions taken  (Respiratory Hygiene/Cough Etiquette; Mouth, nose, eye protection; Hand Hygiene; Personal protective equipment (PPE); safe injection practices; and use of masks and disposable sterile surgical gloves) as recommended by the Department of [place] for Disease Control and Prevention (CDC).  Safety Measures: Allergies were reviewed. Appropriate site, procedure, and patient were confirmed by following the Joint Commission's Universal Protocol (UP.01.01.01). The patient was asked to confirm marked site and procedure, before commencing. The patient was asked about blood thinners, or active infections, both of which were denied. No attempt was made at seeking any paresthesias. Aspiration looking for blood return was conducted prior to injecting. At no point did we inject any substances, as a needle was being advanced.  Pre-procedure Assessment:  A medical history and physical exam were obtained. Relevant documentation was reviewed and verified. Prior to the procedure, the patient was provided with an Audio CD, as well as written information on the procedure, including side-effects, and possible complications. Under the influence of no sedatives, a verbal, as well as a written informed consent were obtained, after having provided information on the risks and possible complications. To fulfill our ethical and legal obligations, as recommended by the American Medical Association's Code of Ethics, we have provided information to the patient about our clinical impression; the nature and purpose of an available treatment or procedure; the risks and benefits of an available treatment or procedure; alternatives; the risk and benefits of the alternative treatment or procedure; and the risks and benefits of not receiving or undergoing a treatment or procedure. The patient was provided information about the risks and possible complications associated with the procedure. These include, but not limited to, failure to  achieve desired goals, infection, bleeding, organ or nerve damage, allergic reactions, paralysis, and death. In addition, the patient was informed that Medicine is not an Chief Strategy Officer;  therefore, there is also the possibility of unforeseen risks and possible complications that may result in a catastrophic outcome. The patient indicated having understood very clearly.  We have given the patient no guarantees and we have made no promises. Ample time was given to the patient to ask questions, all of which were answered, to the patient's satisfaction, before proceeding. The patient understands that by signing our informed consent form, they understand and accept the risks and the fact that it is impossible to predict all possible complications. Baseline vital signs were taken and the medical assessment was completed. Verification of the correct person, correct site (including marking of site), and correct procedure were performed and confirmed by the patient. Baseline vital signs were taken and the initial assessment was completed. Verification of the correct person, correct site (including marking of site), and correct procedure were performed and confirmed by the patient, in the form of a "Time Out".  Monitoring: The patient was monitored in the usual manner, using NIBPM, ECG, and pulse oximetry.  IV Access:  An IV access was obtained and secured.  Analgesia:  Moderate (Conscious) Intravenous sedation: Consent was obtained before administering any sedation. Availability of a responsible, adult driver, and NPO status confirmed. Meaningful verbal contact was maintained, with the patient at all times during the procedure. ASA Sedation Guidelines followed.  Prophylactic Antibiotics: Cefazolin (1st generation cephalosporin) 1 gm IVPB.  Local Anesthesia: Lidocaine 1%. The skin over the procedure site were infiltrated using a 3 ml Luer-Lok syringe with a 0.5 inch, 25-G needle. Deeper tissues were infiltrated  using a 3.0 inch, 22-G spinal needle, under fluoroscopic guidance.  Fluoroscopy: The patient was taken to the operative suite, where the patient was placed in position for the procedure, over the fluoroscopy compatible table. Fluoroscopy was manipulated, using "Tunnel Vision Technique", to obtain the best possible view of the target area, on the affected side. Parallax error was corrected before commencing the procedure. Gabor Racz's "Direction-Depth-Direction" technique was used to introduce the procedural needle under continuous pulsed fluoroscopic guidance. Once the target was reached, antero-posterior and lateral fluoroscopic views were taken to confirm needle placement in two planes. Fluoroscopy time: Please see the patient's chart for details.  Description of the procedure: The procedure site was prepped using a broad-spectrum topical antiseptic. The area was then draped in the usual and standard manner. "Time-out" was performed as per JC Universal Protocol (UP.01.01.01).   A midline incision was made over the spinous processes, and hemostasis attained. The procedural needle was then introduced through the incision using Gabor Racz's "Direction-Depth-Direction" technique, under pulsed fluoroscopic guidance. No attempt was made at seeking a paresthesia. The paramidline approach was used to enter the posterior Cervical epidural space at a 30 degree angle, using "Loss-of-resistance Technique" with 3 ml of PF-NaCl (0.9% NSS) + 0.5 ml of air, in a 5 ml glass syringe, using a "loss-of-bounce technique", at the desired level. Correct needle placement was confirmed in the antero-posterior and lateral fluoroscopic views. The epidural lead was gently introduced under real-time fluoroscopy, constantly assessing for pain or paresthesias, until the tip was observed to be at the target level, on the side ipsilateral to the pain. Once the target was thought to have been reached, antero-posterior and lateral fluoroscopic  views were taken to confirm electrode placement in two planes. Placement was tested until a comfortable stimulation pattern was observed over the usual painful area. Once the patient had assured [place] that the stimulation was in the correct pattern, and distribution, we proceeded  to remove the 15-G "Tuohy" epidural needle. This was done while observing the electrode tip under real-time fluoroscopy to prevent movement. The patient was sedated again, and 1% lidocaine was infiltrated in the left intercostal flank area, in order to create the generator pocket. The extension was tunneled and connected to the generator. An impedance check was conducted after connecting all sections of the system. Once hemostasis was confirmed, both wounds were closed with Vicryl 2-0 after cleaning them with a solution containing 50:50 hydrogen peroxide and Betadine. Surgical staples were used to close the skin. The wounds were covered with sterile transparent bio-occlusive dressings, to easily assess any evidence of infection in the future.  The patient tolerated the entire procedure well. A repeat set of vitals were taken after the procedure and the patient was kept under observation until discharge criteria was met. The patient was provided with discharge instructions, including a section on how to identify potential problems. Should any problems arise concerning this procedure, the patient was given instructions to immediately contact us, without hesitation. The neurostimulator representative and I, both provided the patient with our Business cards containing our contact telephone numbers, and instructed the patient to contact either one of [place], at any time, should there be any problems or questions. In any case, we plan to contact the patient by telephone for a follow-up status report regarding this interventional procedure.  EBL: 20 ml  Complications: No heme; no paresthesias.  Disposition: Return to clinics in 10-11 days for  removal of staples and postoperative evaluation.  Additional Comments/Plan: The patient was provided with a prescription for prophylactic antibiotics to be used for 10 days. (Keflex 500 mg 1 tablet p.o. t.i.d.)  In addition, he was provided with a prescription for Percocet 5/325, 1 to 2 tablets, p.o. 1 to 4 times a day p.r.n. for postoperative pain. (He was provided with 15-day prescription).   Equipment used:  1.  Medtronic accessory kit, model [id number], lot number [id number].  2.  Medtronic lead kit, model [id number], lot number [id number]. 3.  Medtronic rechargeable generator, model [id number] serial number [id number].  This unit MRI compatible.   Disclaimer: Medicine is not an Chief Strategy Officer. The only guarantee in medicine is that nothing is guaranteed. It is important to note that the decision to proceed with this intervention was based on the information collected from the patient. The Data and conclusions were drawn from the patient's questionnaire, the interview, and the physical examination. Because the information was provided in large part by the patient, it cannot be guaranteed that it has not been purposely or unconsciously manipulated. Every effort has been made to obtain as much relevant data as possible for this evaluation. It is important to note that the conclusions that lead to this procedure are derived in large part from the available data. Always take into account that the treatment will also be dependent on availability of resources and existing treatment guidelines, considered by other Pain Management Practitioners as being common knowledge and practice, at this time. For Medico-Legal purposes, it is also important to point out that variations in procedural techniques and pharmacological choices are the acceptable norm. The indications, contraindications, technique, and results of the above procedure should only be interpreted and judged by a Board-Certified Interventional Pain Specialist  with extensive familiarity and expertise in the same exact procedure and technique, doing otherwise would be inappropriate and unethical.    ____________________________ Kathlen Brunswick. Dossie Arbour, MD fan:cg D: 03/02/2013 06:23:53 ET T: 03/02/2013 07:21:49 ET JOB#: [id number]  cc: Allicia Culley A. Dossie Arbour, MD, <Dictator> Carroll Kinds, MD [facility] [phone], MF, [place] MD ELECTRONICALLY SIGNED 03/03/2013 17:26

## 2014-09-05 ENCOUNTER — Encounter: Payer: Self-pay | Admitting: Family Medicine

## 2014-09-05 ENCOUNTER — Ambulatory Visit (INDEPENDENT_AMBULATORY_CARE_PROVIDER_SITE_OTHER): Payer: 59 | Admitting: Family Medicine

## 2014-09-05 VITALS — BP 104/76 | Ht 67.0 in | Wt 131.8 lb

## 2014-09-05 DIAGNOSIS — M12532 Traumatic arthropathy, left wrist: Secondary | ICD-10-CM | POA: Diagnosis not present

## 2014-09-05 DIAGNOSIS — M545 Low back pain, unspecified: Secondary | ICD-10-CM

## 2014-09-05 DIAGNOSIS — M19132 Post-traumatic osteoarthritis, left wrist: Secondary | ICD-10-CM

## 2014-09-05 MED ORDER — OXYCODONE-ACETAMINOPHEN 5-325 MG PO TABS: ORAL_TABLET | ORAL | Status: DC

## 2014-09-05 MED ORDER — IBUPROFEN 800 MG PO TABS: 800.0000 mg | ORAL_TABLET | Freq: Two times a day (BID) | ORAL | Status: DC | PRN

## 2014-09-05 MED ORDER — CHLORZOXAZONE 500 MG PO TABS: 500.0000 mg | ORAL_TABLET | Freq: Three times a day (TID) | ORAL | Status: DC | PRN

## 2014-09-05 NOTE — Progress Notes (Deleted)
   Subjective:    Patient ID: Dustin Walker, male    DOB: 1951/03/23, 64 y.o.   MRN: [id number]  HPI  This patient was seen today for chronic pain  The medication list was reviewed and updated.   -Compliance with pain medication: ***  The patient was advised the importance of maintaining medication and not using illegal substances with these.  Refills needed: ***  The patient was educated that we can provide 3 monthly scripts for their medication, it is their responsibility to follow the instructions.  Side effects or complications from medications: ***  Patient is aware that pain medications are meant to minimize the severity of the pain to allow their pain levels to improve to allow for better function. They are aware of that pain medications cannot totally remove their pain.  Due for UDT ( at least once per year) : ***       Review of Systems     Objective:   Physical Exam        Assessment & Plan:

## 2014-09-05 NOTE — Progress Notes (Signed)
   Subjective:    Patient ID: Dustin Walker, male    DOB: Jan 06, 1951, 64 y.o.   MRN: [id number]  Wrist Pain  The pain is present in the left wrist and back. This is a chronic problem. The current episode started more than 1 year ago. There has been a history of trauma. The problem occurs constantly. The problem has been unchanged. The quality of the pain is described as aching. The pain is at a severity of 4/10. The pain is moderate. Associated symptoms include a limited range of motion. The symptoms are aggravated by cold. He has tried oral narcotics and OTC pain meds for the symptoms. The treatment provided moderate relief. Family history does not include gout or rheumatoid arthritis. There is no history of diabetes, gout, osteoarthritis or rheumatoid arthritis.   Deep ache in hand and wrist, shooting pain into the fingers  Pain   aleave causes burnign with difficulty  Back pain low back and neck and back and forth  Using oxycodone twice per day, using heat patches  Deep heat helps a bit  Will act up again if tris to exercise or ride mower or stretch, even hurts with excessive walking     Review of Systems  Musculoskeletal: Negative for gout.   No headache no chest pain no abdominal pain no change in bowel habits    Objective:   Physical Exam  Alert moderate malaise during exam vital stable lungs clear heart rare rhythm dorsal wrist tenderness with flexion grip intact mobility not intact      Assessment & Plan:  Impression chronic wrist pain with likely no meds of neuropathic pain. #2 back pain diffuse lumbar worse with certain motions likely strain discussed plan oxycodone refilled. Add ibuprofen and chlorzoxazone when necessary. WSL

## 2014-09-05 NOTE — Patient Instructions (Signed)

## 2014-10-23 ENCOUNTER — Other Ambulatory Visit: Payer: Self-pay | Admitting: Nurse Practitioner

## 2014-10-23 MED ORDER — IBUPROFEN 800 MG PO TABS: 800.0000 mg | ORAL_TABLET | Freq: Two times a day (BID) | ORAL | Status: DC | PRN

## 2014-12-06 ENCOUNTER — Ambulatory Visit (INDEPENDENT_AMBULATORY_CARE_PROVIDER_SITE_OTHER): Payer: 59 | Admitting: Family Medicine

## 2014-12-06 ENCOUNTER — Encounter: Payer: Self-pay | Admitting: Family Medicine

## 2014-12-06 VITALS — BP 118/82 | Ht 67.0 in | Wt 122.0 lb

## 2014-12-06 DIAGNOSIS — M12532 Traumatic arthropathy, left wrist: Secondary | ICD-10-CM | POA: Diagnosis not present

## 2014-12-06 DIAGNOSIS — Z79891 Long term (current) use of opiate analgesic: Secondary | ICD-10-CM

## 2014-12-06 DIAGNOSIS — M19132 Post-traumatic osteoarthritis, left wrist: Secondary | ICD-10-CM

## 2014-12-06 MED ORDER — OXYCODONE-ACETAMINOPHEN 5-325 MG PO TABS: ORAL_TABLET | ORAL | Status: DC

## 2014-12-06 MED ORDER — IBUPROFEN 800 MG PO TABS: 800.0000 mg | ORAL_TABLET | Freq: Two times a day (BID) | ORAL | Status: DC | PRN

## 2014-12-06 NOTE — Progress Notes (Signed)
   Subjective:    Patient ID: Dustin Walker, male    DOB: [birth year], 64 y.o.   MRN: [id number]  HPI This patient was seen today for chronic pain  The medication list was reviewed and updated.   -Compliance with pain medication: yes oxycodone twice a day  The patient was advised the importance of maintaining medication and not using illegal substances with these.  Refills needed: yes  The patient was educated that we can provide 3 monthly scripts for their medication, it is their responsibility to follow the instructions.  Side effects or complications from medications: none  Patient is aware that pain medications are meant to minimize the severity of the pain to allow their pain levels to improve to allow for better function. They are aware of that pain medications cannot totally remove their pain.  Due for UDT ( at least once per year) : today    Uses left arm more,  Hand and forearm feeling ongoing pain  Rough during the day  Pt has cut downthe smoking to  To less than one ppd        Review of Systems No headache no chest pain no back pain no abdominal pain    Objective:   Physical Exam Alert vital stable HEENT normal lungs clear heart rare rhythm left arm and hand diminished sensation positive deformity       Assessment & Plan:  Impression chronic pain status post industrial accident. Patient states less so in the summer has backed off a bit on usage still using daily. Plan new pain rules discussed. Pain contract filled out. Urine screen done. Recheck in several months WSL

## 2014-12-06 NOTE — Patient Instructions (Signed)

## 2014-12-12 LAB — TOXASSURE SELECT 13 (MW), URINE: PDF: 0

## 2015-01-16 ENCOUNTER — Other Ambulatory Visit: Payer: Self-pay | Admitting: *Deleted

## 2015-01-16 ENCOUNTER — Telehealth: Payer: Self-pay | Admitting: Family Medicine

## 2015-01-16 MED ORDER — IBUPROFEN 800 MG PO TABS: 800.0000 mg | ORAL_TABLET | Freq: Two times a day (BID) | ORAL | Status: DC | PRN

## 2015-01-16 NOTE — Telephone Encounter (Signed)
Med sent to [email or web address] to let pt know 

## 2015-01-16 NOTE — Telephone Encounter (Signed)
Ok plus three ref 

## 2015-01-16 NOTE — Telephone Encounter (Signed)
ibuprofen (ADVIL,MOTRIN) 800 MG tablet   Pt states that he was to get a refill on this last time but did not,  Needs refill within next few days please.   [facility] [place]

## 2015-01-16 NOTE — Telephone Encounter (Signed)
Last seen 12/06/14 for chronic pain

## 2015-01-17 NOTE — Telephone Encounter (Signed)
Pt.notified

## 2015-03-07 ENCOUNTER — Encounter: Payer: Self-pay | Admitting: Family Medicine

## 2015-03-07 ENCOUNTER — Ambulatory Visit (INDEPENDENT_AMBULATORY_CARE_PROVIDER_SITE_OTHER): Payer: 59 | Admitting: Family Medicine

## 2015-03-07 VITALS — BP 98/64 | Ht 67.0 in | Wt 128.4 lb

## 2015-03-07 DIAGNOSIS — J441 Chronic obstructive pulmonary disease with (acute) exacerbation: Secondary | ICD-10-CM

## 2015-03-07 DIAGNOSIS — H02876 Vascular anomalies of left eye, unspecified eyelid: Secondary | ICD-10-CM

## 2015-03-07 DIAGNOSIS — M12532 Traumatic arthropathy, left wrist: Secondary | ICD-10-CM

## 2015-03-07 DIAGNOSIS — M19132 Post-traumatic osteoarthritis, left wrist: Secondary | ICD-10-CM

## 2015-03-07 MED ORDER — OXYCODONE-ACETAMINOPHEN 5-325 MG PO TABS: ORAL_TABLET | ORAL | Status: DC

## 2015-03-07 NOTE — Patient Instructions (Signed)

## 2015-03-07 NOTE — Progress Notes (Signed)
   Subjective:    Patient ID: Dustin Walker, male    DOB: 1951/01/06, 64 y.o.   MRN: [id number]  HPI This patient was seen today for chronic pain  The medication list was reviewed and updated.   -Compliance with medication: yes  - Number patient states they take daily: 2 daily during winter months  -when was the last dose patient took: this morning  The patient was advised the importance of maintaining medication and not using illegal substances with these.  Refills needed: yes  The patient was educated that we can provide 3 monthly scripts for their medication, it is their responsibility to follow the instructions.  Side effects or complications from medications: none  Patient is aware that pain medications are meant to minimize the severity of the pain to allow their pain levels to improve to allow for better function. They are aware of that pain medications cannot totally remove their pain.  Due for UDT ( at least once per year) : yes   Patient has a   ""no headache no chest pain no back pain no abdominal pain   stye on his left eye he would like the doctor to take a look at.   Some exercise ff and on  Tried to  Ow the laewn this summer   Breathing stable, uses vent prn. Patient unfortunately continues to smoke. He is aware that he has COPD. "  Day # [phone] Review of Systems See above ROS otherwise negative    Objective:   Physical Exam  Alert no apparent distress lungs diminished breath sounds rare wheeze no tachypnea heart regular in rhythm left hand and wrist informed positive pain with pressure positive pain with flexion of the wrist ankles without edema left upper eyelid small nodular region under magnification appears potential skin cancer many features consistent with basal cell      Assessment & Plan:  Impression 1 chronic pain discuss #2 COPD on unfortunately ongoing discussed strongly encouraged to stop smoking #3 potential basal cell skin  cancer eyelid discussed plan referral to dermatologist. Pain medicines written. Diet exercise discussed. Follow-up as scheduled WSL

## 2015-03-08 ENCOUNTER — Ambulatory Visit: Payer: 59 | Admitting: Family Medicine

## 2015-03-20 ENCOUNTER — Encounter: Payer: Self-pay | Admitting: Family Medicine

## 2015-04-02 ENCOUNTER — Other Ambulatory Visit: Payer: Self-pay

## 2015-04-02 DIAGNOSIS — C44119 Basal cell carcinoma of skin of left eyelid, including canthus: Secondary | ICD-10-CM | POA: Diagnosis not present

## 2015-04-02 DIAGNOSIS — C4491 Basal cell carcinoma of skin, unspecified: Secondary | ICD-10-CM

## 2015-04-02 DIAGNOSIS — D239 Other benign neoplasm of skin, unspecified: Secondary | ICD-10-CM | POA: Diagnosis not present

## 2015-04-02 HISTORY — DX: Basal cell carcinoma of skin, unspecified: C44.91

## 2015-05-21 ENCOUNTER — Telehealth: Payer: Self-pay | Admitting: Family Medicine

## 2015-05-21 MED ORDER — IBUPROFEN 800 MG PO TABS: 800.0000 mg | ORAL_TABLET | Freq: Two times a day (BID) | ORAL | Status: DC | PRN

## 2015-05-21 NOTE — Telephone Encounter (Signed)
Called patient and informed him per Dr.Steve Luking- Refills on ibuprofen 800 mg was sent to requested pharmacy. Patient verbalized understanding.

## 2015-05-21 NOTE — Telephone Encounter (Signed)
Pt is requesting a refill on his 800 mg ibuprofen.    [facility] [place]

## 2015-05-21 NOTE — Telephone Encounter (Signed)
Ok 11 ref 

## 2015-06-07 ENCOUNTER — Encounter: Payer: Self-pay | Admitting: Family Medicine

## 2015-06-07 ENCOUNTER — Ambulatory Visit (INDEPENDENT_AMBULATORY_CARE_PROVIDER_SITE_OTHER): Payer: 59 | Admitting: Family Medicine

## 2015-06-07 VITALS — BP 110/80 | Ht 67.0 in | Wt 130.2 lb

## 2015-06-07 DIAGNOSIS — M792 Neuralgia and neuritis, unspecified: Secondary | ICD-10-CM | POA: Insufficient documentation

## 2015-06-07 DIAGNOSIS — G44039 Episodic paroxysmal hemicrania, not intractable: Secondary | ICD-10-CM

## 2015-06-07 DIAGNOSIS — G5692 Unspecified mononeuropathy of left upper limb: Secondary | ICD-10-CM

## 2015-06-07 DIAGNOSIS — J441 Chronic obstructive pulmonary disease with (acute) exacerbation: Secondary | ICD-10-CM | POA: Diagnosis not present

## 2015-06-07 DIAGNOSIS — M12532 Traumatic arthropathy, left wrist: Secondary | ICD-10-CM

## 2015-06-07 DIAGNOSIS — M19132 Post-traumatic osteoarthritis, left wrist: Secondary | ICD-10-CM

## 2015-06-07 MED ORDER — NORTRIPTYLINE HCL 25 MG PO CAPS: ORAL_CAPSULE | ORAL | Status: DC

## 2015-06-07 MED ORDER — OXYCODONE-ACETAMINOPHEN 5-325 MG PO TABS: ORAL_TABLET | ORAL | Status: DC

## 2015-06-07 NOTE — Patient Instructions (Signed)

## 2015-06-07 NOTE — Progress Notes (Signed)
   Subjective:    Patient ID: Dustin Walker, male    DOB: 1950-11-29, 65 y.o.   MRN: [id number]  HPI This patient was seen today for chronic left hand and wrist pain  The medication list was reviewed and updated.   -Compliance with medication: yes  - Number patient states they take daily: 2 daily in winter 1 tablet in spring and summer  -when was the last dose patient took: this morning  The patient was advised the importance of maintaining medication and not using illegal substances with these.  Refills needed: yes  The patient was educated that we can provide 3 monthly scripts for their medication, it is their responsibility to follow the instructions.  Side effects or complications from medications: none  Patient is aware that pain medications are meant to minimize the severity of the pain to allow their pain levels to improve to allow for better function. They are aware of that pain medications cannot totally remove their pain.  Due for UDT ( at least once per year) : utd   Patient states that he has headaches 1-2 times monthly. Patient states that the headaches last a week at a time. Onset 8 months ago. Treatments tried: none  no personal history of migraines. Headache severe with nausea. At times throbbing. No prodrome or aura  Sharp and burning sensations and stabbing pains in the hand and wrist. This is an altogether different pain. 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 Sharp burning lancinating intermittent.  Gets them once or twice per month, at times head will throb and gets nauseated. Hx of bad headaches in the past. Whole head starts throbbing and then it can last for about a week.  Takes ibu 800   Review of Systems  no abdominal pain no change in bowel habits and blood in stool    Objective:   Physical Exam   alert vitals stable. HEENT normal lungs diminished breath sounds heart regular in rhythm. Addendum history using inhaler  every couple 3 days left arm hand chronic deformity noted. Sensation intact. 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 Initiate nighttime nortriptyline. Pain medicines written. Diet exercise discussed local measures discussed WSL

## 2015-06-12 DIAGNOSIS — C44119 Basal cell carcinoma of skin of left eyelid, including canthus: Secondary | ICD-10-CM | POA: Diagnosis not present

## 2015-08-13 ENCOUNTER — Encounter: Payer: Self-pay | Admitting: Family Medicine

## 2015-08-13 ENCOUNTER — Ambulatory Visit (INDEPENDENT_AMBULATORY_CARE_PROVIDER_SITE_OTHER): Payer: 59 | Admitting: Family Medicine

## 2015-08-13 VITALS — BP 120/78 | Temp 99.2°F | Ht 67.0 in | Wt 130.4 lb

## 2015-08-13 DIAGNOSIS — H6692 Otitis media, unspecified, left ear: Secondary | ICD-10-CM

## 2015-08-13 DIAGNOSIS — I889 Nonspecific lymphadenitis, unspecified: Secondary | ICD-10-CM | POA: Diagnosis not present

## 2015-08-13 MED ORDER — OXYCODONE-ACETAMINOPHEN 7.5-325 MG PO TABS: 1.0000 | ORAL_TABLET | Freq: Three times a day (TID) | ORAL | Status: DC | PRN

## 2015-08-13 MED ORDER — CEFTRIAXONE SODIUM 1 G IJ SOLR
500.0000 mg | Freq: Once | INTRAMUSCULAR | Status: AC
  Administered 2015-08-13: 500 mg via INTRAMUSCULAR

## 2015-08-13 MED ORDER — CLARITHROMYCIN 500 MG PO TABS
500.0000 mg | ORAL_TABLET | Freq: Two times a day (BID) | ORAL | Status: DC
Start: 2015-08-13 — End: 2016-01-01

## 2015-08-13 MED ORDER — PREDNISONE 20 MG PO TABS: ORAL_TABLET | ORAL | Status: DC

## 2015-08-13 NOTE — Progress Notes (Signed)
   Subjective:    Patient ID: Dustin Walker, male    DOB: 1950/11/14, 65 y.o.   MRN: [id number]  Otalgia  There is pain in the left ear. This is a new problem. The current episode started yesterday.   Had similar pain a few wks ago, lasted a few days, pain became serious   No fever  Pos cong estion , mild in nature. Pain deep into left lateral neck and left ear(  Sore throat a little     Review of Systems  HENT: Positive for ear pain.        Objective:   Physical Exam Alert vital stable tympanic membranes normal some left anterior cervical node tenderness neck supple some lateral neck tenderness deep palpation no actual mass pharynx normal lungs clear heart regular in rhythm.       Assessment & Plan:  Impression cervical lymphadenitis with secondary otalgia plan in about shot per patient request increased pain medicine prednisone taper for inflammatory component antibiotics prescribed WSL

## 2015-09-05 ENCOUNTER — Ambulatory Visit (INDEPENDENT_AMBULATORY_CARE_PROVIDER_SITE_OTHER): Payer: 59 | Admitting: Family Medicine

## 2015-09-05 ENCOUNTER — Encounter: Payer: Self-pay | Admitting: Family Medicine

## 2015-09-05 VITALS — BP 106/74 | Ht 67.0 in | Wt 129.4 lb

## 2015-09-05 DIAGNOSIS — K219 Gastro-esophageal reflux disease without esophagitis: Secondary | ICD-10-CM | POA: Diagnosis not present

## 2015-09-05 DIAGNOSIS — M792 Neuralgia and neuritis, unspecified: Secondary | ICD-10-CM

## 2015-09-05 DIAGNOSIS — G5692 Unspecified mononeuropathy of left upper limb: Secondary | ICD-10-CM | POA: Diagnosis not present

## 2015-09-05 DIAGNOSIS — M12532 Traumatic arthropathy, left wrist: Secondary | ICD-10-CM | POA: Diagnosis not present

## 2015-09-05 DIAGNOSIS — M19132 Post-traumatic osteoarthritis, left wrist: Secondary | ICD-10-CM

## 2015-09-05 DIAGNOSIS — J441 Chronic obstructive pulmonary disease with (acute) exacerbation: Secondary | ICD-10-CM

## 2015-09-05 MED ORDER — OXYCODONE-ACETAMINOPHEN 5-325 MG PO TABS: ORAL_TABLET | ORAL | Status: DC

## 2015-09-05 MED ORDER — PANTOPRAZOLE SODIUM 40 MG PO TBEC: 40.0000 mg | DELAYED_RELEASE_TABLET | Freq: Every day | ORAL | Status: DC

## 2015-09-05 MED ORDER — ALBUTEROL SULFATE HFA 108 (90 BASE) MCG/ACT IN AERS: 2.0000 | INHALATION_SPRAY | Freq: Four times a day (QID) | RESPIRATORY_TRACT | Status: DC | PRN

## 2015-09-05 NOTE — Patient Instructions (Signed)

## 2015-09-05 NOTE — Progress Notes (Signed)
   Subjective:    Patient ID: Dustin Walker, male    DOB: Jan 06, 1951, 65 y.o.   MRN: [id number]  HPI This patient was seen today for chronic pain  The medication list was reviewed and updated.   -Compliance with medication: Yes, Takes daily. - Number patient states they take daily: Patient take 1-2 tablets daily.  -when was the last dose patient took? This morning   The patient was advised the importance of maintaining medication and not using illegal substances with these.  Refills needed: yes   The patient was educated that we can provide 3 monthly scripts for their medication, it is their responsibility to follow the instructions.  Side effects or complications from medications: None   Patient is aware that pain medications are meant to minimize the severity of the pain to allow their pain levels to improve to allow for better function. They are aware of that pain medications cannot totally remove their pain.  Due for UDT ( at least once per year) : 12/05/2015  Ear infsn cleared up,  Using the pain med, recently hand has been stiff and tight, worse int the morn''  Still using two per d on the pain meds,  Reflux worse in the evening, just recently , using mylanta extra strength, helps some. Having more more troubles with reflux. Seems overly cranked up when he took the prednisone.  Continues to have occasional cough. Occasional wheezing. Unfortunately still smoking. Having substantial shortness of breath with exertion, no chest pain no nausea no diaphoresis  '       Review of Systems No headache, no major weight loss or weight gain, no chest pain no back pain abdominal pain no change in bowel habits complete ROS otherwise negative     Objective:   Physical Exam  Alert vital stable lungs clear but breath sounds diminished diffusely heart rare rhythm left hand positive deformity secondary to injury. Sensation diminished in areas and hyperesthesia and other areas  grip compromise abdomen slight epigastric tenderness no rebound no guarding      Assessment & Plan:  Impression flare of reflux discussed prednisone they've attitude. Smoking not helping all factors discussed including diet #2 chronic pain in the meds taking 2 per day deftly needs it in order to function. No obvious side effects #3 COPD ongoing challenges smoking cessation discussed once again plan as noted above pain medicine written diet exercise discussed recheck in several months WSL

## 2015-10-01 ENCOUNTER — Ambulatory Visit (INDEPENDENT_AMBULATORY_CARE_PROVIDER_SITE_OTHER): Payer: 59 | Admitting: Family Medicine

## 2015-10-01 ENCOUNTER — Encounter: Payer: Self-pay | Admitting: Family Medicine

## 2015-10-01 VITALS — BP 114/76 | Ht 67.0 in | Wt 134.0 lb

## 2015-10-01 DIAGNOSIS — M12532 Traumatic arthropathy, left wrist: Secondary | ICD-10-CM | POA: Diagnosis not present

## 2015-10-01 DIAGNOSIS — G5692 Unspecified mononeuropathy of left upper limb: Secondary | ICD-10-CM | POA: Diagnosis not present

## 2015-10-01 DIAGNOSIS — M792 Neuralgia and neuritis, unspecified: Secondary | ICD-10-CM

## 2015-10-01 DIAGNOSIS — M19132 Post-traumatic osteoarthritis, left wrist: Secondary | ICD-10-CM

## 2015-10-01 MED ORDER — OXYCODONE-ACETAMINOPHEN 7.5-325 MG PO TABS: 1.0000 | ORAL_TABLET | Freq: Three times a day (TID) | ORAL | Status: DC | PRN

## 2015-10-01 NOTE — Progress Notes (Signed)
   Subjective:    Patient ID: Dustin Walker, male    DOB: [birth year], 65 y.o.   MRN: [id number]  Hand Pain  The incident occurred more than 1 week ago. There was no injury mechanism. The pain is present in the left wrist and left hand. The quality of the pain is described as stabbing, shooting and aching (sharp, dull). The pain is at a severity of 8/10. Treatments tried: Oxycodone.   Patient states no other concerns this visit.  Pain both neuropathic and deep ach and shurting    No bavck pain no neck pain   Patient has history of substantial left hand pain due to chronic disability., Some of the pain is somatic with joint discomfort at site of prior injury, some his neuropathic sharp shooting pains: Treated and associated with numbness   Review of Systems No headache, no major weight loss or weight gain, no chest pain no back pain abdominal pain no change in bowel habits complete ROS otherwise negative     Objective:   Physical Exam  Alert vital stable lungs clear heart rare rhythm left hand substantial deformity. Substantial limitation of motion.      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 Rationale discussed local measures discussed WSL

## 2015-10-29 ENCOUNTER — Other Ambulatory Visit: Payer: Self-pay

## 2015-10-29 DIAGNOSIS — C44529 Squamous cell carcinoma of skin of other part of trunk: Secondary | ICD-10-CM | POA: Diagnosis not present

## 2015-10-29 DIAGNOSIS — C4492 Squamous cell carcinoma of skin, unspecified: Secondary | ICD-10-CM

## 2015-10-29 HISTORY — DX: Squamous cell carcinoma of skin, unspecified: C44.92

## 2015-11-22 DIAGNOSIS — C44529 Squamous cell carcinoma of skin of other part of trunk: Secondary | ICD-10-CM | POA: Diagnosis not present

## 2015-12-05 ENCOUNTER — Ambulatory Visit: Payer: 59 | Admitting: Family Medicine

## 2015-12-11 ENCOUNTER — Other Ambulatory Visit: Payer: Self-pay | Admitting: Family Medicine

## 2016-01-01 ENCOUNTER — Ambulatory Visit (INDEPENDENT_AMBULATORY_CARE_PROVIDER_SITE_OTHER): Payer: 59 | Admitting: Family Medicine

## 2016-01-01 ENCOUNTER — Encounter: Payer: Self-pay | Admitting: Family Medicine

## 2016-01-01 VITALS — BP 128/82 | Ht 67.0 in | Wt 129.4 lb

## 2016-01-01 DIAGNOSIS — Z125 Encounter for screening for malignant neoplasm of prostate: Secondary | ICD-10-CM | POA: Diagnosis not present

## 2016-01-01 DIAGNOSIS — E785 Hyperlipidemia, unspecified: Secondary | ICD-10-CM | POA: Diagnosis not present

## 2016-01-01 DIAGNOSIS — M12532 Traumatic arthropathy, left wrist: Secondary | ICD-10-CM

## 2016-01-01 DIAGNOSIS — Z0000 Encounter for general adult medical examination without abnormal findings: Secondary | ICD-10-CM | POA: Diagnosis not present

## 2016-01-01 DIAGNOSIS — M792 Neuralgia and neuritis, unspecified: Secondary | ICD-10-CM

## 2016-01-01 DIAGNOSIS — Z79891 Long term (current) use of opiate analgesic: Secondary | ICD-10-CM

## 2016-01-01 DIAGNOSIS — Z79899 Other long term (current) drug therapy: Secondary | ICD-10-CM | POA: Diagnosis not present

## 2016-01-01 DIAGNOSIS — G5692 Unspecified mononeuropathy of left upper limb: Secondary | ICD-10-CM | POA: Diagnosis not present

## 2016-01-01 DIAGNOSIS — J441 Chronic obstructive pulmonary disease with (acute) exacerbation: Secondary | ICD-10-CM

## 2016-01-01 DIAGNOSIS — M19132 Post-traumatic osteoarthritis, left wrist: Secondary | ICD-10-CM

## 2016-01-01 MED ORDER — OXYCODONE-ACETAMINOPHEN 7.5-325 MG PO TABS: 1.0000 | ORAL_TABLET | Freq: Three times a day (TID) | ORAL | 0 refills | Status: DC | PRN

## 2016-01-01 NOTE — Progress Notes (Signed)
Subjective:    Patient ID: Dustin Walker, male    DOB: [birth year], 65 y.o.   MRN: [id number]  HPI  This patient was seen today for chronic pain  The medication list was reviewed and updated.   -Compliance with medication: yes  - Number patient states they take daily: 2-3 tabs a day  -when was the last dose patient took? This am  The patient was advised the importance of maintaining medication and not using illegal substances with these.  Refills needed: yes  The patient was educated that we can provide 3 monthly scripts for their medication, it is their responsibility to follow the instructions.  Side effects or complications from medications:   Patient is aware that pain medications are meant to minimize the severity of the pain to allow their pain levels to improve to allow for better function. They are aware of that pain medications cannot totally remove their pain.  Due for UDT ( at least once per year) :    Left hand felt more tight and stiff, with icr tencxdcy towards pain  Worse from the wrist to the joint  Breathing overall is good  Colonoscopy done yrs ago, seven yrs ago, was supposed to do again two yrs ago  Self grades diet as pretty good  Ongoing sob with the copd,  [employer] ,  Review of Systems  Constitutional: Negative for activity change, appetite change and fever.  HENT: Negative for congestion and rhinorrhea.   Eyes: Negative for discharge.  Respiratory: Negative for cough and wheezing.   Cardiovascular: Negative for chest pain.  Gastrointestinal: Negative for abdominal pain, blood in stool and vomiting.  Genitourinary: Negative for difficulty urinating and frequency.  Musculoskeletal: Negative for neck pain.  Skin: Negative for rash.  Allergic/Immunologic: Negative for environmental allergies and food allergies.  Neurological: Negative for weakness and headaches.  Psychiatric/Behavioral: Negative for agitation.  All other systems  reviewed and are negative.      Objective:   Physical Exam  Constitutional: He appears well-developed and well-nourished.  HENT:  Head: Normocephalic and atraumatic.  Right Ear: External ear normal.  Left Ear: External ear normal.  Nose: Nose normal.  Mouth/Throat: Oropharynx is clear and moist.  Eyes: EOM are normal. Pupils are equal, round, and reactive to light.  Neck: Normal range of motion. Neck supple. No thyromegaly present.  Cardiovascular: Normal rate, regular rhythm and normal heart sounds.   No murmur heard. Pulmonary/Chest: Effort normal and breath sounds normal. No respiratory distress. He has no wheezes.  Abdominal: Soft. Bowel sounds are normal. He exhibits no distension and no mass. There is no tenderness.  Genitourinary: Penis normal.  Musculoskeletal: Normal range of motion. He exhibits no edema.  Lymphadenopathy:    He has no cervical adenopathy.  Neurological: He is alert. He exhibits normal muscle tone.  Skin: Skin is warm and dry. No erythema.  Psychiatric: He has a normal mood and affect. His behavior is normal. Judgment normal.  Vitals reviewed.  Left hand chronic abnormal appearance. Pulses intact sensation        Assessment & Plan:   impression 1 wellness exam. Patient overdue on colonoscopy. Given sheet patient to call Dr. Guerry Minors office. Had a tubular adenoma and follow-up to over 2 years ago. #2 chronic pain left hand #3 COPD clinically stable uses albuterol couple times a month. Does not smoke any longer plan appropriate blood work. Pain medications refilled diet exercise discussed. Recheck in 3 months. Some constipation with chronic pain  medicine, and approaches discussed WSL

## 2016-01-05 LAB — BASIC METABOLIC PANEL
BUN / CREAT RATIO: 16 (ref 10–24)
BUN: 23 mg/dL (ref 8–27)
CO2: 18 mmol/L (ref 18–29)
CREATININE: 1.46 mg/dL — AB (ref 0.76–1.27)
Calcium: 9.2 mg/dL (ref 8.6–10.2)
Chloride: 104 mmol/L (ref 96–106)
GFR calc Af Amer: 58 mL/min/{1.73_m2} — ABNORMAL LOW (ref >59)
GFR calc non Af Amer: 50 mL/min/{1.73_m2} — ABNORMAL LOW (ref >59)
GLUCOSE: 99 mg/dL (ref 65–99)
Potassium: 5.3 mmol/L — ABNORMAL HIGH (ref 3.5–5.2)
Sodium: 142 mmol/L (ref 134–144)

## 2016-01-05 LAB — LIPID PANEL
CHOL/HDL RATIO: 4.4 ratio (ref 0.0–5.0)
Cholesterol, Total: 205 mg/dL — ABNORMAL HIGH (ref 100–199)
HDL: 47 mg/dL (ref >39)
LDL Calculated: 138 mg/dL — ABNORMAL HIGH (ref 0–99)
Triglycerides: 98 mg/dL (ref 0–149)
VLDL Cholesterol Cal: 20 mg/dL (ref 5–40)

## 2016-01-05 LAB — HEPATIC FUNCTION PANEL
ALT: 15 IU/L (ref 0–44)
AST: 19 IU/L (ref 0–40)
Albumin: 4.3 g/dL (ref 3.6–4.8)
Alkaline Phosphatase: 70 IU/L (ref 39–117)
BILIRUBIN TOTAL: 0.2 mg/dL (ref 0.0–1.2)
BILIRUBIN, DIRECT: 0.07 mg/dL (ref 0.00–0.40)
TOTAL PROTEIN: 7.3 g/dL (ref 6.0–8.5)

## 2016-01-05 LAB — PSA: PROSTATE SPECIFIC AG, SERUM: 0.5 ng/mL (ref 0.0–4.0)

## 2016-01-07 ENCOUNTER — Other Ambulatory Visit: Payer: Self-pay | Admitting: Family Medicine

## 2016-01-09 ENCOUNTER — Telehealth: Payer: Self-pay | Admitting: Family Medicine

## 2016-01-09 MED ORDER — NORTRIPTYLINE HCL 25 MG PO CAPS: ORAL_CAPSULE | ORAL | 11 refills | Status: DC

## 2016-01-09 NOTE — Telephone Encounter (Signed)
May ref one yrs worth

## 2016-01-09 NOTE — Telephone Encounter (Signed)
Patient requesting Rx for nortriptyline (PAMELOR) 25 MG capsule    [facility]  He says his pharmacy sent [place] a refill request, but they have not heard back yet.

## 2016-01-09 NOTE — Telephone Encounter (Signed)
Prescription sent electronically to pharmacy. Patient notified. 

## 2016-01-10 LAB — TOXASSURE SELECT 13 (MW), URINE: PDF: 0

## 2016-01-14 ENCOUNTER — Encounter: Payer: Self-pay | Admitting: Family Medicine

## 2016-02-18 DIAGNOSIS — D235 Other benign neoplasm of skin of trunk: Secondary | ICD-10-CM | POA: Diagnosis not present

## 2016-03-19 ENCOUNTER — Other Ambulatory Visit: Payer: Self-pay | Admitting: Family Medicine

## 2016-03-31 ENCOUNTER — Encounter: Payer: Self-pay | Admitting: Family Medicine

## 2016-03-31 ENCOUNTER — Ambulatory Visit (INDEPENDENT_AMBULATORY_CARE_PROVIDER_SITE_OTHER): Payer: 59 | Admitting: Family Medicine

## 2016-03-31 VITALS — BP 108/66 | Ht 67.0 in | Wt 128.0 lb

## 2016-03-31 DIAGNOSIS — J441 Chronic obstructive pulmonary disease with (acute) exacerbation: Secondary | ICD-10-CM | POA: Diagnosis not present

## 2016-03-31 DIAGNOSIS — G5692 Unspecified mononeuropathy of left upper limb: Secondary | ICD-10-CM | POA: Diagnosis not present

## 2016-03-31 DIAGNOSIS — K219 Gastro-esophageal reflux disease without esophagitis: Secondary | ICD-10-CM | POA: Diagnosis not present

## 2016-03-31 DIAGNOSIS — E7800 Pure hypercholesterolemia, unspecified: Secondary | ICD-10-CM

## 2016-03-31 DIAGNOSIS — Z1159 Encounter for screening for other viral diseases: Secondary | ICD-10-CM | POA: Diagnosis not present

## 2016-03-31 DIAGNOSIS — Z23 Encounter for immunization: Secondary | ICD-10-CM

## 2016-03-31 DIAGNOSIS — M792 Neuralgia and neuritis, unspecified: Secondary | ICD-10-CM

## 2016-03-31 MED ORDER — OXYCODONE-ACETAMINOPHEN 7.5-325 MG PO TABS: 1.0000 | ORAL_TABLET | Freq: Three times a day (TID) | ORAL | 0 refills | Status: DC | PRN

## 2016-03-31 NOTE — Progress Notes (Signed)
   Subjective:  Patient arrives office with numerous concerns  Patient ID: Dustin Walker, male    DOB: 29-Aug-1950, 65 y.o.   MRN: [id number]  HPI This patient was seen today for chronic pain. Takes for left hand pain  The medication list was reviewed and updated.   -Compliance with medication: yes  - Number patient states they take daily: 2 -3 a day   -when was the last dose patient took? This am  The patient was advised the importance of maintaining medication and not using illegal substances with these.  Refills needed: yes  The patient was educated that we can provide 3 monthly scripts for their medication, it is their responsibility to follow the instructions.  Side effects or complications from medications: drowsy  Patient is aware that pain medications are meant to minimize the severity of the pain to allow their pain levels to improve to allow for better function. They are aware of that pain medications cannot totally remove their pain.  Due for UDT ( at least once per year) : done at last visit in August  Pt declines flu vaccine today.   Taking three per day, pain in wrist goes to joints Sharp neurppathic pain into the thumb and hand  Reflux considerable, without the med has a difficult time. Tried off the med states deftly needs it. Needs ongoing refills.  Using inhaler only once or twice per mo. Known history of COPD. At first patient did not want to take the flu vaccine, but on further discussionPt willing to take  Review of Systems No headache, no major weight loss or weight gain, no chest pain no back pain abdominal pain no change in bowel habits complete ROS otherwise negative     Objective:   Physical Exam  Alert vitals stable, NAD. Blood pressure good on repeat. HEENT normal. Lungs clear. Heart regular rate and rhythm.       Assessment & Plan:  Impression 1 chronic pain discussed with ongoing need for meds #2 neuropathic element fair control with  current meds #3 reflux ongoing need for meds #4 COPD discussed again patient will and use flu shot #5 hyperlipidemia discussed the diagnosis see blood work patient to work on. Meds refilled. Flu shot

## 2016-04-01 LAB — HEPATITIS C ANTIBODY: Hep C Virus Ab: <0.1 s/co ratio (ref 0.0–0.9)

## 2016-05-30 ENCOUNTER — Other Ambulatory Visit: Payer: Self-pay | Admitting: Family Medicine

## 2016-06-20 ENCOUNTER — Other Ambulatory Visit: Payer: Self-pay | Admitting: Family Medicine

## 2016-06-23 MED ORDER — PANTOPRAZOLE SODIUM 40 MG PO TBEC: 40.0000 mg | DELAYED_RELEASE_TABLET | Freq: Every day | ORAL | 2 refills | Status: DC

## 2016-06-23 NOTE — Addendum Note (Signed)
Addended by: Dairl Ponder on: 06/23/2016 02:48 PM   Modules accepted: Orders

## 2016-07-01 ENCOUNTER — Encounter: Payer: Self-pay | Admitting: Family Medicine

## 2016-07-01 ENCOUNTER — Ambulatory Visit (INDEPENDENT_AMBULATORY_CARE_PROVIDER_SITE_OTHER): Payer: 59 | Admitting: Family Medicine

## 2016-07-01 VITALS — BP 112/70 | Ht 67.0 in | Wt 134.5 lb

## 2016-07-01 DIAGNOSIS — K2100 Gastro-esophageal reflux disease with esophagitis, without bleeding: Secondary | ICD-10-CM

## 2016-07-01 DIAGNOSIS — G5692 Unspecified mononeuropathy of left upper limb: Secondary | ICD-10-CM | POA: Diagnosis not present

## 2016-07-01 DIAGNOSIS — J441 Chronic obstructive pulmonary disease with (acute) exacerbation: Secondary | ICD-10-CM

## 2016-07-01 DIAGNOSIS — K219 Gastro-esophageal reflux disease without esophagitis: Secondary | ICD-10-CM | POA: Diagnosis not present

## 2016-07-01 DIAGNOSIS — M792 Neuralgia and neuritis, unspecified: Secondary | ICD-10-CM

## 2016-07-01 DIAGNOSIS — M19132 Post-traumatic osteoarthritis, left wrist: Secondary | ICD-10-CM

## 2016-07-01 MED ORDER — OXYCODONE-ACETAMINOPHEN 7.5-325 MG PO TABS: 1.0000 | ORAL_TABLET | Freq: Three times a day (TID) | ORAL | 0 refills | Status: DC | PRN

## 2016-07-01 MED ORDER — RANITIDINE HCL 300 MG PO TABS: 300.0000 mg | ORAL_TABLET | Freq: Every day | ORAL | 3 refills | Status: DC

## 2016-07-01 NOTE — Patient Instructions (Signed)

## 2016-07-01 NOTE — Progress Notes (Signed)
   Subjective:    Patient ID: Dustin Walker, male    DOB: [birth year], 66 y.o.   MRN: [id number] Patient arrives office with several concerns HPI This patient was seen today for chronic pain.  The medication list was reviewed and updated.   -Compliance with medication: yes  - Number patient states they take daily: 2-3 tablets daily   -when was the last dose patient took: this morning   The patient was advised the importance of maintaining medication and not using illegal substances with these.  Refills needed: yes  The patient was educated that we can provide 3 monthly scripts for their medication, it is their responsibility to follow the instructions.  Side effects or complications from medications: none  Patient is aware that pain medications are meant to minimize the severity of the pain to allow their pain levels to improve to allow for better function. They are aware of that pain medications cannot totally remove their pain.  Due for UDT ( at least once per year): up to date    Patient compliant with pain medication. Continues to experience the pain which led to initiation of analgesic intervention. No significant negative side effects. States definitely needs the pain medication to maintain current level of functioning. Does not receive controlled substance pain medication elsewhere.   Patient states that the pantoprazole is not working for his acid reflux. Heart burn waking pt up most nights  Three cups per day of coffee  Smokes a pack per day  Copd stable, spray twice in the past couple months. Unfortunately still smoking. Positive shortness of breath with exertion. Exercising some. But not a lot.     Not much of a spicy food gy, off and onn    Review of Systems Alert vitals stable, NAD. Blood pressure good on repeat. HEENT normal. Lungs clear. Heart regular rate and rhythm.     Objective:   Physical Exam Alert vital stable HEENT normal lungs diminished  breath sounds no tachypnea no wheezing heart regular in rhythm left hand hyper sensation distally positive clawhand deformity formation noted  Abdomen no particular abdominal tenderness no rebound no guarding       Assessment & Plan:  Impression: Chronic pain. Patient compliant with medication. No substantial side effects. [place] controlled substance registry reviewed to ensure compliance and proper use of medication. Patient aware goal of medicine is not complete resolution of pain but to control his symptoms to improve his functional capacity. Aware of potential adverse side effects#2 worsening reflux discussed despite Protonix. Took one of his wife's ranitidine with benefit discussed will give a trial #3 COPD with element of reactive airways rare use of inhaler unfortunately ongoing smoking encouraged to stop cessation efforts discussed easily 25 minutes spent on all this greater than 50% in discussion and counseling

## 2016-07-07 ENCOUNTER — Other Ambulatory Visit: Payer: Self-pay | Admitting: *Deleted

## 2016-07-07 MED ORDER — IBUPROFEN 800 MG PO TABS: 800.0000 mg | ORAL_TABLET | Freq: Two times a day (BID) | ORAL | 1 refills | Status: DC | PRN

## 2016-07-07 MED ORDER — PANTOPRAZOLE SODIUM 40 MG PO TBEC: 40.0000 mg | DELAYED_RELEASE_TABLET | Freq: Every day | ORAL | 1 refills | Status: DC

## 2016-07-07 MED ORDER — NORTRIPTYLINE HCL 25 MG PO CAPS: ORAL_CAPSULE | ORAL | 5 refills | Status: DC

## 2016-07-07 MED ORDER — RANITIDINE HCL 300 MG PO TABS: 300.0000 mg | ORAL_TABLET | Freq: Every day | ORAL | 1 refills | Status: DC

## 2016-07-07 NOTE — Telephone Encounter (Signed)
Six mo ok 

## 2016-07-15 ENCOUNTER — Telehealth: Payer: Self-pay | Admitting: Family Medicine

## 2016-07-15 NOTE — Telephone Encounter (Signed)
error 

## 2016-07-25 ENCOUNTER — Telehealth: Payer: Self-pay | Admitting: Family Medicine

## 2016-07-25 MED ORDER — NORTRIPTYLINE HCL 50 MG PO CAPS: 100.0000 mg | ORAL_CAPSULE | Freq: Every day | ORAL | 0 refills | Status: DC

## 2016-07-25 NOTE — Telephone Encounter (Signed)
Patient calling because he doesn't think his headache meds are working anymore.  Has a bad headache this morning. No other symptoms other than a throbbing headache. Wants to know if dosage should be increased or if meds should me changed? Please advise. He takes: nortriptyline (PAMELOR) 25 MG capsule

## 2016-07-25 NOTE — Telephone Encounter (Signed)
Bad throbbing headache this am-is on pamelor 25 for headache prevention and feels it may need to be adjusted

## 2016-07-25 NOTE — Telephone Encounter (Signed)
We can increase nortriptyline-new dosage 100 mg daily at bedtime (patient can do for of the 25 mg or we can switch it to 50 mg 2 each evening) I would recommend if the headache does not improve he needs to be seen-if he starts having any worrisome symptoms such as vomiting blurred vision passing out he needs to go to the ER. Otherwise he ought to strongly consider doing a follow-up visit next week with Dr. Richardson Landry for his headaches

## 2016-07-25 NOTE — Telephone Encounter (Signed)
Patient advised we  can increase nortriptyline-new dosage 100 mg daily at bedtime (patient can do for of the 25 mg or we can switch it to 50 mg 2 each evening) Dr Nicki Reaper would recommend if the headache does not improve he needs to be seen-if he starts having any worrisome symptoms such as vomiting blurred vision passing out he needs to go to the ER. Otherwise he ought to strongly consider doing a follow-up visit next week with Dr. Richardson Landry for his headaches. Patient verbalized understanding. Prescription sent electronically to pharmacy.

## 2016-07-30 ENCOUNTER — Encounter: Payer: Self-pay | Admitting: Family Medicine

## 2016-07-30 ENCOUNTER — Ambulatory Visit (INDEPENDENT_AMBULATORY_CARE_PROVIDER_SITE_OTHER): Payer: 59 | Admitting: Family Medicine

## 2016-07-30 DIAGNOSIS — G43009 Migraine without aura, not intractable, without status migrainosus: Secondary | ICD-10-CM

## 2016-07-30 MED ORDER — TOPIRAMATE 25 MG PO TABS: ORAL_TABLET | ORAL | 5 refills | Status: DC

## 2016-07-30 MED ORDER — ALBUTEROL SULFATE HFA 108 (90 BASE) MCG/ACT IN AERS: 2.0000 | INHALATION_SPRAY | Freq: Four times a day (QID) | RESPIRATORY_TRACT | 5 refills | Status: DC | PRN

## 2016-07-30 NOTE — Progress Notes (Signed)
   Subjective:    Patient ID: Dustin Walker, male    DOB: [birth year], 66 y.o.   MRN: [id number]  Headache   This is a recurrent problem. The current episode started 1 to 4 weeks ago. The problem occurs daily. Pain location: all over head. The pain quality is similar to prior headaches. The quality of the pain is described as throbbing, squeezing and sharp. Treatments tried: nortriptyline 50mg  2 qhs. The treatment provided moderate relief.   Needs refills on albuterol inhaler.   Frontal pressure nd headache, fairly severe or soon after when wakes up  Throbbing  Last time with headaches had similar situation with throbbing  Yr and a yrhaf ago  Took nortryptiline qhs   Not having h a a night to wake up  When coughs r sneezes some discomfort     No sig nausea,  Headache , pos pounding and throbbing sensation  Pos hx of vascular headaches in the past  Review of Systems  Neurological: Positive for headaches.       Objective:   Physical Exam   Alert and oriented, vitals reviewed and stable, NAD ENT-TM's and ext canals WNL bilat via otoscopic exam Soft palate, tonsils and post pharynx WNL via oropharyngeal exam Neck-symmetric, no masses; thyroid nonpalpable and nontender Pulmonary-no tachypnea or accessory muscle use; Clear without wheezes via auscultation Card--no abnrml murmurs, rhythm reg and rate WNL Carotid pulses symmetric, without bruits      Assessment & Plan:  Impression exacerbation of chronic neuropathic/vascular headache. Discussed at length. Patient prefers to hold off on major workup at this time which I think is reasonable considering his history of having experienced these before. Nortriptyline helped calm them down but now no longer working as well. Will add Topamax rationale discussed side effects benefits discussed  Greater than 50% of this 25 minute face to face visit was spent in counseling and discussion and coordination of care regarding the above  diagnosis/diagnosies

## 2016-08-18 ENCOUNTER — Other Ambulatory Visit: Payer: Self-pay

## 2016-08-18 DIAGNOSIS — L570 Actinic keratosis: Secondary | ICD-10-CM | POA: Diagnosis not present

## 2016-08-18 DIAGNOSIS — Z85828 Personal history of other malignant neoplasm of skin: Secondary | ICD-10-CM | POA: Diagnosis not present

## 2016-08-18 DIAGNOSIS — D229 Melanocytic nevi, unspecified: Secondary | ICD-10-CM | POA: Diagnosis not present

## 2016-08-18 DIAGNOSIS — D492 Neoplasm of unspecified behavior of bone, soft tissue, and skin: Secondary | ICD-10-CM | POA: Diagnosis not present

## 2016-08-25 ENCOUNTER — Other Ambulatory Visit: Payer: Self-pay

## 2016-08-25 MED ORDER — NORTRIPTYLINE HCL 50 MG PO CAPS: 100.0000 mg | ORAL_CAPSULE | Freq: Every day | ORAL | 5 refills | Status: DC

## 2016-08-25 NOTE — Progress Notes (Signed)
Ok six refills if this is a med request

## 2016-08-27 ENCOUNTER — Other Ambulatory Visit: Payer: Self-pay | Admitting: *Deleted

## 2016-08-28 MED ORDER — TOPIRAMATE 25 MG PO TABS: ORAL_TABLET | ORAL | 5 refills | Status: DC

## 2016-09-29 ENCOUNTER — Encounter: Payer: Self-pay | Admitting: Family Medicine

## 2016-09-29 ENCOUNTER — Ambulatory Visit (INDEPENDENT_AMBULATORY_CARE_PROVIDER_SITE_OTHER): Payer: 59 | Admitting: Family Medicine

## 2016-09-29 VITALS — BP 94/64 | Ht 67.0 in | Wt 132.0 lb

## 2016-09-29 DIAGNOSIS — J441 Chronic obstructive pulmonary disease with (acute) exacerbation: Secondary | ICD-10-CM

## 2016-09-29 DIAGNOSIS — K219 Gastro-esophageal reflux disease without esophagitis: Secondary | ICD-10-CM

## 2016-09-29 DIAGNOSIS — G43009 Migraine without aura, not intractable, without status migrainosus: Secondary | ICD-10-CM | POA: Diagnosis not present

## 2016-09-29 DIAGNOSIS — M792 Neuralgia and neuritis, unspecified: Secondary | ICD-10-CM

## 2016-09-29 DIAGNOSIS — G5692 Unspecified mononeuropathy of left upper limb: Secondary | ICD-10-CM

## 2016-09-29 MED ORDER — OXYCODONE-ACETAMINOPHEN 7.5-325 MG PO TABS: 1.0000 | ORAL_TABLET | Freq: Three times a day (TID) | ORAL | 0 refills | Status: DC | PRN

## 2016-09-29 NOTE — Progress Notes (Signed)
   Subjective:    Patient ID: Dustin Walker, male    DOB: 1950-06-21, 66 y.o.   MRN: [id number] Patient arrives office with numerous concerns HPI This patient was seen today for chronic pain  The medication list was reviewed and updated.   -Compliance with medication: Patient states takes daily   - Number patient states they take daily: States takes twice per day.  -when was the last dose patient took? Last dose this morning   The patient was advised the importance of maintaining medication and not using illegal substances with these.  Refills needed: Yes   The patient was educated that we can provide 3 monthly scripts for their medication, it is their responsibility to follow the instructions.  Side effects or complications from medications: Constipation.   Patient is aware that pain medications are meant to minimize the severity of the pain to allow their pain levels to improve to allow for better function. They are aware of that pain medications cannot totally remove their pain.  Due for UDT ( at least once per year) : 12/31/2016  Has concerns of stiffness to left hand and wrist.    Hand pain seems to be worse, wakes up with it, also burning at times, definitely worsening. Patient at this time would not like to go up on pain medication. Was told by specialist in the past there were no further interventions can be done   Headaches neuropathic overal are better, due to run out of nortriptyline shortly. States that combination of Topamax and nortriptyline definitely helping headaches.   Short of breath at times with exertion. Ongoing cough. Unfortunately ongoing smoking despite known COPD diagnosis discussed Review of Systems No headache, no major weight loss or weight gain, no chest pain no back pain abdominal pain no change in bowel habits complete ROS otherwise negative     Objective:   Physical Exam Alert and oriented, vitals reviewed and stable, NAD ENT-TM's and ext  canals WNL bilat via otoscopic exam Soft palate, tonsils and post pharynx WNL via oropharyngeal exam Neck-symmetric, no masses; thyroid nonpalpable and nontender Pulmonary-no tachypnea or accessory muscle use; Clear without wheezes via auscultationDiminished sounds diffusely Card--no abnrml murmurs, rhythm reg and rate WNL Carotid pulses symmetric, without bruits Left hand positive atrophy scarring diminished sensation Holman fingerpad's       Assessment & Plan:  Impression 1 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 Patient compliant with medication. No substantial side effects. [place] controlled substance registry reviewed to ensure compliance and proper use of medication. Patient aware goal of medicine is not complete resolution of pain but to control his symptoms to improve his functional capacity. Aware of potential adverse side effects  Recheck in several months

## 2016-09-30 ENCOUNTER — Other Ambulatory Visit: Payer: Self-pay | Admitting: Family Medicine

## 2016-10-05 ENCOUNTER — Other Ambulatory Visit: Payer: Self-pay | Admitting: Family Medicine

## 2016-10-07 NOTE — Telephone Encounter (Signed)
Last seen 09/29/16

## 2016-10-10 ENCOUNTER — Telehealth: Payer: Self-pay | Admitting: Family Medicine

## 2016-10-10 MED ORDER — PANTOPRAZOLE SODIUM 40 MG PO TBEC: 40.0000 mg | DELAYED_RELEASE_TABLET | Freq: Every day | ORAL | 0 refills | Status: DC

## 2016-10-10 NOTE — Telephone Encounter (Signed)
Spoke with patient and informed him that refills were sent into pharmacy. Patient verbalized understanding.

## 2016-10-10 NOTE — Telephone Encounter (Signed)
Pt is needing pantoprazole [facility]

## 2016-12-01 ENCOUNTER — Other Ambulatory Visit: Payer: Self-pay | Admitting: Family Medicine

## 2016-12-10 ENCOUNTER — Other Ambulatory Visit: Payer: Self-pay | Admitting: Family Medicine

## 2016-12-25 ENCOUNTER — Other Ambulatory Visit: Payer: Self-pay | Admitting: Family Medicine

## 2016-12-29 NOTE — Telephone Encounter (Signed)
Please send this to Dr. Richardson Landry

## 2016-12-30 ENCOUNTER — Encounter: Payer: Self-pay | Admitting: Family Medicine

## 2016-12-30 ENCOUNTER — Other Ambulatory Visit: Payer: Self-pay | Admitting: *Deleted

## 2016-12-30 ENCOUNTER — Ambulatory Visit (INDEPENDENT_AMBULATORY_CARE_PROVIDER_SITE_OTHER): Payer: 59 | Admitting: Family Medicine

## 2016-12-30 VITALS — BP 112/72 | Wt 126.2 lb

## 2016-12-30 DIAGNOSIS — G43009 Migraine without aura, not intractable, without status migrainosus: Secondary | ICD-10-CM

## 2016-12-30 DIAGNOSIS — G5692 Unspecified mononeuropathy of left upper limb: Secondary | ICD-10-CM

## 2016-12-30 DIAGNOSIS — K2100 Gastro-esophageal reflux disease with esophagitis, without bleeding: Secondary | ICD-10-CM

## 2016-12-30 DIAGNOSIS — J441 Chronic obstructive pulmonary disease with (acute) exacerbation: Secondary | ICD-10-CM

## 2016-12-30 DIAGNOSIS — Z79891 Long term (current) use of opiate analgesic: Secondary | ICD-10-CM

## 2016-12-30 DIAGNOSIS — M792 Neuralgia and neuritis, unspecified: Secondary | ICD-10-CM

## 2016-12-30 MED ORDER — OXYCODONE-ACETAMINOPHEN 7.5-325 MG PO TABS: 1.0000 | ORAL_TABLET | Freq: Three times a day (TID) | ORAL | 0 refills | Status: DC | PRN

## 2016-12-30 NOTE — Progress Notes (Signed)
   Subjective:    Patient ID: Dustin Walker, male    DOB: 01/26/1951, 66 y.o.   MRN: [id number] Patient arrives with numerous concerns HPI This patient was seen today for chronic pain  The medication list was reviewed and updated.   -Compliance with medication: yes  - Number patient states they take daily: 2 a day  -when was the last dose patient took? This am  The patient was advised the importance of maintaining medication and not using illegal substances with these.  Refills needed: yes  The patient was educated that we can provide 3 monthly scripts for their medication, it is their responsibility to follow the instructions.  Side effects or complications from medications: today  Patient is aware that pain medications are meant to minimize the severity of the pain to allow their pain levels to improve to allow for better function. They are aware of that pain medications cannot totally remove their pain.  Due for UDT ( at least once per year) : today 12/30/16   Hand more painful in the past month, more sharp shooting neuropathic type pain into hand. Patient claims compliance with nortriptyline.  somw headaches too,migr have come bk, hitting hard, having headaches nearly every day, no longer migraine like, sometimes just dee pressure,states no styress these . Headache is diffuse. Achy at times. Rarely throbbing. Patient claims compliance with Topamax  g  Overall good, uses inhaler once rpre month  Or so, unfortunately continues to smoke. Cough is generally nonproductive. Occurs with exertion.    Review of Systems No headache, no major weight loss or weight gain, no chest pain no back pain abdominal pain no change in bowel habits complete ROS otherwise negative     Objective:   Physical Exam  Alert and oriented, vitals reviewed and stable, NAD ENT-TM's and ext canals WNL bilat via otoscopic exam Soft palate, tonsils and post pharynx WNL via oropharyngeal  exam Neck-symmetric, no masses; thyroid nonpalpable and nontender Pulmonary-no tachypnea or accessory muscle use; Clear without wheezes via auscultation Card--no abnrml murmurs, rhythm reg and rate WNL Carotid pulses symmetric, without bruits Left hand chronically deformed hypersensitive pulses good grip diminished Neuro exam otherwise diffusely intact     Assessment & Plan:  Impression 1 migraine headaches clinically improved. Now supplanted unfortunately by tension-like headaches. Patient feels no new stress. No nocturnal headaches. No worsening of headaches with coughing or sneezing.  COPD. Ongoing smoker unfortunately. Smoking cessation discussed. Symptom management discussed.  Chronic pain neuropathic left hand severe at times. Patient feels needs increase medication. This is reasonable. Will increase to 3 times a day  Impression: Chronic pain. Patient compliant with medication. No substantial side effects. [place] controlled substance registry reviewed to ensure compliance and proper use of medication. Patient aware goal of medicine is not complete resolution of pain but to control his symptoms to improve his functional capacity. Aware of potential adverse side effects

## 2017-01-02 LAB — TOXASSURE SELECT 13 (MW), URINE

## 2017-02-09 ENCOUNTER — Other Ambulatory Visit: Payer: Self-pay | Admitting: *Deleted

## 2017-02-09 ENCOUNTER — Telehealth: Payer: Self-pay | Admitting: Family Medicine

## 2017-02-09 MED ORDER — OXYCODONE-ACETAMINOPHEN 7.5-325 MG PO TABS: 1.0000 | ORAL_TABLET | Freq: Three times a day (TID) | ORAL | 0 refills | Status: DC | PRN

## 2017-02-09 NOTE — Telephone Encounter (Signed)
Pt is needing the two refills on  oxyCODONE-acetaminophen (PERCOCET) 7.5-325 MG tablet  Pt states that it is working.

## 2017-02-09 NOTE — Telephone Encounter (Signed)
Ok lets do 

## 2017-02-09 NOTE — Telephone Encounter (Signed)
Seen 12/30/16 for pain management. Med increased to one tid. One script given that day.

## 2017-02-09 NOTE — Telephone Encounter (Signed)
Script ready for pickup. Pt notified.  

## 2017-02-17 ENCOUNTER — Other Ambulatory Visit: Payer: Self-pay | Admitting: Family Medicine

## 2017-02-24 ENCOUNTER — Other Ambulatory Visit: Payer: Self-pay | Admitting: Family Medicine

## 2017-02-24 NOTE — Telephone Encounter (Signed)
This is Dr. Richardson Landry thank you

## 2017-02-24 NOTE — Telephone Encounter (Signed)
Last seen 12/30/16 

## 2017-03-04 ENCOUNTER — Other Ambulatory Visit: Payer: Self-pay | Admitting: *Deleted

## 2017-03-04 ENCOUNTER — Telehealth: Payer: Self-pay | Admitting: *Deleted

## 2017-03-04 MED ORDER — OXYCODONE-ACETAMINOPHEN 7.5-325 MG PO TABS: 1.0000 | ORAL_TABLET | Freq: Three times a day (TID) | ORAL | 0 refills | Status: DC | PRN

## 2017-03-04 NOTE — Telephone Encounter (Signed)
Pt requesting refill on pain med. Last seen august. Next appt November 20th.

## 2017-03-04 NOTE — Telephone Encounter (Signed)
done

## 2017-03-25 ENCOUNTER — Other Ambulatory Visit: Payer: Self-pay | Admitting: Family Medicine

## 2017-03-31 ENCOUNTER — Ambulatory Visit (INDEPENDENT_AMBULATORY_CARE_PROVIDER_SITE_OTHER): Payer: 59 | Admitting: Family Medicine

## 2017-03-31 ENCOUNTER — Encounter: Payer: Self-pay | Admitting: Family Medicine

## 2017-03-31 VITALS — BP 122/74 | Ht 67.0 in | Wt 130.0 lb

## 2017-03-31 DIAGNOSIS — M792 Neuralgia and neuritis, unspecified: Secondary | ICD-10-CM

## 2017-03-31 DIAGNOSIS — G5692 Unspecified mononeuropathy of left upper limb: Secondary | ICD-10-CM | POA: Diagnosis not present

## 2017-03-31 DIAGNOSIS — Z125 Encounter for screening for malignant neoplasm of prostate: Secondary | ICD-10-CM | POA: Diagnosis not present

## 2017-03-31 DIAGNOSIS — Z79899 Other long term (current) drug therapy: Secondary | ICD-10-CM

## 2017-03-31 DIAGNOSIS — Z13220 Encounter for screening for lipoid disorders: Secondary | ICD-10-CM | POA: Diagnosis not present

## 2017-03-31 DIAGNOSIS — J441 Chronic obstructive pulmonary disease with (acute) exacerbation: Secondary | ICD-10-CM

## 2017-03-31 DIAGNOSIS — G43009 Migraine without aura, not intractable, without status migrainosus: Secondary | ICD-10-CM

## 2017-03-31 MED ORDER — OXYCODONE-ACETAMINOPHEN 7.5-325 MG PO TABS: 1.0000 | ORAL_TABLET | Freq: Three times a day (TID) | ORAL | 0 refills | Status: DC | PRN

## 2017-03-31 NOTE — Progress Notes (Signed)
   Subjective:    Patient ID: Dustin Walker, male    DOB: 02/15/1951, 66 y.o.   MRN: [id number]  HPI This patient was seen today for chronic pain  The medication list was reviewed and updated. Takes for pain in left hand   -Compliance with medication: yes  - Number patient states they take daily: 3 day  -when was the last dose patient took? today  The patient was advised the importance of maintaining medication and not using illegal substances with these.  Refills needed: yes  The patient was educated that we can provide 3 monthly scripts for their medication, it is their responsibility to follow the instructions.  Side effects or complications from medications: none  Patient is aware that pain medications are meant to minimize the severity of the pain to allow their pain levels to improve to allow for better function. They are aware of that pain medications cannot totally remove their pain.  Due for UDT ( at least once per year) : last one 12/30/16  Concerns about outside of ears burning. Started in May. Comes and goes.   Headaches ovrall better o the med, def needs the med to help.   brething overall bter   Patient compliant with pain medication. Continues to experience the pain which led to initiation of analgesic intervention. No significant negative side effects. States definitely needs the pain medication to maintain current level of functioning. Does not receive controlled substance pain medication elsewhere.  Outer edge of ears burning and tender   Skin gets irritated and actually peels some  Both ears  Lays on side o sleep          Review of Systems No headache, no major weight loss or weight gain, no chest pain no back pain abdominal pain no change in bowel habits complete ROS otherwise negative     Objective:   Physical Exam  Alert and oriented, vitals reviewed and stable, NAD ENT-TM's and ext canals WNL bilat via otoscopic exam Soft palate,  tonsils and post pharynx WNL via oropharyngeal exam Neck-symmetric, no masses; thyroid nonpalpable and nontender Pulmonary-no tachypnea or accessory muscle use; Clear without wheezes via auscultation Card--no abnrml murmurs, rhythm reg and rate WNL Carotid pulses symmetric, without bruits Left hand positive chronic deformity.  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  Discussed.  Impression: Chronic pain. Patient compliant with medication. No substantial side effects. [place] controlled substance registry reviewed to ensure compliance and proper use of medication. Patient aware goal of medicine is not complete resolution of pain but to control his symptoms to improve his functional capacity. Aware of potential adverse side effects  #3 ear cartilage inflammation.  Discussed methods of reducing pressure on ears.  Patient to try  Greater than 50% of this 25 minute face to face visit was spent in counseling and discussion and coordination of care regarding the above diagnosis/diagnosies

## 2017-04-27 ENCOUNTER — Telehealth: Payer: Self-pay | Admitting: Family Medicine

## 2017-04-27 MED ORDER — PANTOPRAZOLE SODIUM 40 MG PO TBEC: 40.0000 mg | DELAYED_RELEASE_TABLET | Freq: Every day | ORAL | 3 refills | Status: DC

## 2017-04-27 NOTE — Telephone Encounter (Signed)
Prescription sent electronically to pharmacy. Patient notified. 

## 2017-04-27 NOTE — Telephone Encounter (Signed)
Requesting Rx for pantoprazole 40 mg to [facility].

## 2017-04-27 NOTE — Telephone Encounter (Signed)
Ok one yrs

## 2017-05-11 ENCOUNTER — Other Ambulatory Visit: Payer: Self-pay | Admitting: Family Medicine

## 2017-06-25 DIAGNOSIS — L814 Other melanin hyperpigmentation: Secondary | ICD-10-CM | POA: Diagnosis not present

## 2017-06-25 DIAGNOSIS — L570 Actinic keratosis: Secondary | ICD-10-CM | POA: Diagnosis not present

## 2017-06-25 DIAGNOSIS — D229 Melanocytic nevi, unspecified: Secondary | ICD-10-CM | POA: Diagnosis not present

## 2017-06-26 LAB — PSA: PROSTATE SPECIFIC AG, SERUM: 1.1 ng/mL (ref 0.0–4.0)

## 2017-06-26 LAB — HEPATIC FUNCTION PANEL
ALBUMIN: 4.1 g/dL (ref 3.6–4.8)
ALK PHOS: 67 IU/L (ref 39–117)
ALT: 14 IU/L (ref 0–44)
AST: 17 IU/L (ref 0–40)
BILIRUBIN, DIRECT: 0.07 mg/dL (ref 0.00–0.40)
Bilirubin Total: 0.2 mg/dL (ref 0.0–1.2)
TOTAL PROTEIN: 6.5 g/dL (ref 6.0–8.5)

## 2017-06-26 LAB — BASIC METABOLIC PANEL
BUN / CREAT RATIO: 13 (ref 10–24)
BUN: 21 mg/dL (ref 8–27)
CO2: 19 mmol/L — AB (ref 20–29)
CREATININE: 1.66 mg/dL — AB (ref 0.76–1.27)
Calcium: 8.9 mg/dL (ref 8.6–10.2)
Chloride: 106 mmol/L (ref 96–106)
GFR calc Af Amer: 50 mL/min/{1.73_m2} — ABNORMAL LOW (ref >59)
GFR, EST NON AFRICAN AMERICAN: 43 mL/min/{1.73_m2} — AB (ref >59)
GLUCOSE: 92 mg/dL (ref 65–99)
Potassium: 4.7 mmol/L (ref 3.5–5.2)
SODIUM: 139 mmol/L (ref 134–144)

## 2017-06-26 LAB — LIPID PANEL
CHOL/HDL RATIO: 4.1 ratio (ref 0.0–5.0)
Cholesterol, Total: 141 mg/dL (ref 100–199)
HDL: 34 mg/dL — ABNORMAL LOW (ref >39)
LDL Calculated: 79 mg/dL (ref 0–99)
Triglycerides: 138 mg/dL (ref 0–149)
VLDL Cholesterol Cal: 28 mg/dL (ref 5–40)

## 2017-06-30 ENCOUNTER — Ambulatory Visit (INDEPENDENT_AMBULATORY_CARE_PROVIDER_SITE_OTHER): Payer: 59 | Admitting: Family Medicine

## 2017-06-30 ENCOUNTER — Encounter: Payer: Self-pay | Admitting: Family Medicine

## 2017-06-30 VITALS — BP 98/72 | Ht 67.0 in | Wt 132.0 lb

## 2017-06-30 DIAGNOSIS — N183 Chronic kidney disease, stage 3 unspecified: Secondary | ICD-10-CM | POA: Insufficient documentation

## 2017-06-30 DIAGNOSIS — M792 Neuralgia and neuritis, unspecified: Secondary | ICD-10-CM

## 2017-06-30 DIAGNOSIS — Z79891 Long term (current) use of opiate analgesic: Secondary | ICD-10-CM | POA: Diagnosis not present

## 2017-06-30 DIAGNOSIS — Z1211 Encounter for screening for malignant neoplasm of colon: Secondary | ICD-10-CM | POA: Diagnosis not present

## 2017-06-30 DIAGNOSIS — Z0000 Encounter for general adult medical examination without abnormal findings: Secondary | ICD-10-CM

## 2017-06-30 DIAGNOSIS — J441 Chronic obstructive pulmonary disease with (acute) exacerbation: Secondary | ICD-10-CM | POA: Diagnosis not present

## 2017-06-30 DIAGNOSIS — G5692 Unspecified mononeuropathy of left upper limb: Secondary | ICD-10-CM

## 2017-06-30 MED ORDER — OXYCODONE-ACETAMINOPHEN 7.5-325 MG PO TABS: 1.0000 | ORAL_TABLET | Freq: Three times a day (TID) | ORAL | 0 refills | Status: DC | PRN

## 2017-06-30 NOTE — Progress Notes (Signed)
Subjective:    Patient ID: Dustin Walker, male    DOB: [birth year], 67 y.o.   MRN: [id number]  HPI Patient is here today for annual exam and pain management. This patient was seen today for chronic pain  The medication list was reviewed and updated.   -Compliance with medication: Yes  - Number patient states they take daily: 3 times daily -when was the last dose patient took? This am around 7:30 am  The patient was advised the importance of maintaining medication and not using illegal substances with these.  Here for refills and follow up  The patient was educated that we can provide 3 monthly scripts for their medication, it is their responsibility to follow the instructions.  Side effects or complications from medications:sleepy  Patient is aware that pain medications are meant to minimize the severity of the pain to allow their pain levels to improve to allow for better function. They are aware of that pain medications cannot totally remove their pain.  Due for UDT ( at least once per year) : 12/30/2017  The patient comes in today for a wellness visit.    A review of their health history was completed.  A review of medications was also completed.  Any needed refills; Needs pain rx dated 07/17/2017  Eating habits: Good  Falls/  MVA accidents in past few months:No  Regular exercise: Yes  Specialist pt sees on regular basis: No  Preventative health issues were discussed.   Additional concerns: No    Results for orders placed or performed in visit on 03/31/17  Lipid panel  Result Value Ref Range   Cholesterol, Total 141 100 - 199 mg/dL   Triglycerides 138 0 - 149 mg/dL   HDL 34 (L) >39 mg/dL   VLDL Cholesterol Cal 28 5 - 40 mg/dL   LDL Calculated 79 0 - 99 mg/dL   Chol/HDL Ratio 4.1 0.0 - 5.0 ratio  Hepatic function panel  Result Value Ref Range   Total Protein 6.5 6.0 - 8.5 g/dL   Albumin 4.1 3.6 - 4.8 g/dL   Bilirubin Total 0.2 0.0 - 1.2 mg/dL   Bilirubin, Direct 0.07 0.00 - 0.40 mg/dL   Alkaline Phosphatase 67 39 - 117 IU/L   AST 17 0 - 40 IU/L   ALT 14 0 - 44 IU/L  Basic metabolic panel  Result Value Ref Range   Glucose 92 65 - 99 mg/dL   BUN 21 8 - 27 mg/dL   Creatinine, Ser 1.66 (H) 0.76 - 1.27 mg/dL   GFR calc non Af Amer 43 (L) >59 mL/min/1.73   GFR calc Af Amer 50 (L) >59 mL/min/1.73   BUN/Creatinine Ratio 13 10 - 24   Sodium 139 134 - 144 mmol/L   Potassium 4.7 3.5 - 5.2 mmol/L   Chloride 106 96 - 106 mmol/L   CO2 19 (L) 20 - 29 mmol/L   Calcium 8.9 8.6 - 10.2 mg/dL  PSA  Result Value Ref Range   Prostate Specific Ag, Serum 1.1 0.0 - 4.0 ng/mL     Review of Systems  Constitutional: Negative for activity change, appetite change and fever.  HENT: Negative for congestion and rhinorrhea.   Eyes: Negative for discharge.  Respiratory: Negative for cough and wheezing.   Cardiovascular: Negative for chest pain.  Gastrointestinal: Negative for abdominal pain, blood in stool and vomiting.  Genitourinary: Negative for difficulty urinating and frequency.  Musculoskeletal: Negative for neck pain.  Skin: Negative for rash.  Allergic/Immunologic:  Negative for environmental allergies and food allergies.  Neurological: Negative for weakness and headaches.  Psychiatric/Behavioral: Negative for agitation.  All other systems reviewed and are negative.      Objective:   Physical Exam  Constitutional: He appears well-developed and well-nourished.  HENT:  Head: Normocephalic and atraumatic.  Right Ear: External ear normal.  Left Ear: External ear normal.  Nose: Nose normal.  Mouth/Throat: Oropharynx is clear and moist.  Eyes: Right eye exhibits no discharge. Left eye exhibits no discharge. No scleral icterus.  Neck: Normal range of motion. Neck supple. No thyromegaly present.  Cardiovascular: Normal rate, regular rhythm and normal heart sounds.  No murmur heard. Pulmonary/Chest: Effort normal and breath sounds normal.  No respiratory distress. He has no wheezes.  Abdominal: Soft. Bowel sounds are normal. He exhibits no distension and no mass. There is no tenderness.  Genitourinary: Penis normal.  Musculoskeletal: Normal range of motion. He exhibits no edema.  Lymphadenopathy:    He has no cervical adenopathy.  Neurological: He is alert. He exhibits normal muscle tone. Coordination normal.  Skin: Skin is warm and dry. No erythema.  Psychiatric: He has a normal mood and affect. His behavior is normal. Judgment normal.  Vitals reviewed.         Assessment & Plan:  Impression 1 wellness exam.  Diet and exercise discussed.  Smoking cessation discussed.  Overdue for colonoscopy patient reluctant but agrees to let us set him up  2.  Chronic pain ongoing neuropathic pain see prior notes needs refill of medicines claims compliance  3.  COPD once again importance of smoking cessation discussed her graph   #4 chronic renal failure discussed stage III.  Encouraged once again to quit smoking this regard  Impression: Chronic pain. Patient compliant with medication. No substantial side effects. [place] controlled substance registry reviewed to ensure compliance and proper use of medication. Patient aware goal of medicine is not complete resolution of pain but to control his symptoms to improve his functional capacity. Aware of potential adverse side effects

## 2017-07-02 ENCOUNTER — Encounter: Payer: Self-pay | Admitting: Family Medicine

## 2017-07-06 ENCOUNTER — Other Ambulatory Visit: Payer: Self-pay | Admitting: Family Medicine

## 2017-07-15 ENCOUNTER — Encounter: Payer: Self-pay | Admitting: Internal Medicine

## 2017-09-08 ENCOUNTER — Encounter: Payer: Self-pay | Admitting: Gastroenterology

## 2017-09-08 ENCOUNTER — Other Ambulatory Visit: Payer: Self-pay

## 2017-09-08 ENCOUNTER — Ambulatory Visit (INDEPENDENT_AMBULATORY_CARE_PROVIDER_SITE_OTHER): Payer: 59 | Admitting: Gastroenterology

## 2017-09-08 ENCOUNTER — Telehealth: Payer: Self-pay

## 2017-09-08 DIAGNOSIS — Z86010 Personal history of colonic polyps: Secondary | ICD-10-CM

## 2017-09-08 DIAGNOSIS — K5903 Drug induced constipation: Secondary | ICD-10-CM

## 2017-09-08 DIAGNOSIS — T402X5A Adverse effect of other opioids, initial encounter: Secondary | ICD-10-CM

## 2017-09-08 MED ORDER — LUBIPROSTONE 24 MCG PO CAPS: 24.0000 ug | ORAL_CAPSULE | Freq: Two times a day (BID) | ORAL | 5 refills | Status: DC

## 2017-09-08 MED ORDER — PEG 3350-KCL-NA BICARB-NACL 420 G PO SOLR: 4000.0000 mL | ORAL | 0 refills | Status: DC

## 2017-09-08 NOTE — Progress Notes (Signed)
Primary Care Physician:  Mikey Kirschner, MD  Primary Gastroenterologist:  Garfield Cornea, MD   Chief Complaint  Patient presents with  . Colonoscopy    consult    HPI:  Dustin Walker is a 67 y.o. male here to schedule overdue surveillance colonoscopy for history of adenomatous colon polyps.  He was due back in 2015.    Since we last saw him he had a crush injury to his left hand has had surgery.  He suffers from chronic pain and is on chronic oxycodone.  He tells me he failed conscious sedation with a procedure since his colonoscopy, was awake and felt pain but cannot verbalize this to the provider.  Generally he has constipation since being on oxycodone.  Initially MiraLAX did well in managing symptoms but with increased dose of oxycodone he is having greater difficulty having a BM.  He is also utilizing suppositories to assist with bowel movements.  Only abdominal pain is when he gets significantly constipated.  Sometimes will note bright red blood on the toilet tissue with wiping which she feels is related to hemorrhoids.  No melena.  Upper GI symptoms controlled.  He states he would be interested in [facility] hemorrhoid banding if he is a candidate.  Current Outpatient Medications  Medication Sig Dispense Refill  . albuterol (PROVENTIL HFA;VENTOLIN HFA) 108 (90 Base) MCG/ACT inhaler Inhale 2 puffs into the lungs every 6 (six) hours as needed for wheezing or shortness of breath. 1 Inhaler 5  . IBU 800 MG tablet TAKE 1 TABLET BY MOUTH TWO  TIMES DAILY AS NEEDED 240 tablet 3  . nortriptyline (PAMELOR) 50 MG capsule TAKE 2 CAPSULES BY MOUTH AT BEDTIME 60 capsule 11  . oxyCODONE-acetaminophen (PERCOCET) 7.5-325 MG tablet Take 1 tablet by mouth every 8 (eight) hours as needed for severe pain. 90 tablet 0  . pantoprazole (PROTONIX) 40 MG tablet Take 1 tablet (40 mg total) by mouth daily. 90 tablet 3  . ranitidine (ZANTAC) 300 MG tablet TAKE 1 TABLET BY MOUTH  DAILY 90 tablet 0  . topiramate  (TOPAMAX) 25 MG tablet TAKE 2 TABLETS BY MOUTH TWO TIMES DAILY 360 tablet 5   No current facility-administered medications for this visit.     Allergies as of 09/08/2017 - Review Complete 09/08/2017  Allergen Reaction Noted  . Naproxen sodium [naproxen sodium] Rash 06/19/2011  . Codeine Rash 06/19/2011    Past Medical History:  Diagnosis Date  . Allergic rhinitis   . Chronic pain    left hand, crush injury in 2013  . COPD (chronic obstructive pulmonary disease) ([place])     Past Surgical History:  Procedure Laterality Date  . COLONOSCOPY  2010   Dr. Gala Romney: tubular adenoma removed. was due for surveillance in 2015.  . I&D EXTREMITY  06/19/2011   Procedure: IRRIGATION AND DEBRIDEMENT EXTREMITY;  Surgeon: Dennie Bible, MD;  Location: [place];  Service: Plastics;;  . KNEE SURGERY Bilateral    6 times per knee and total knee replacement on the left  . ORIF ULNAR FRACTURE  06/19/2011   Procedure: OPEN REDUCTION INTERNAL FIXATION (ORIF) ULNAR FRACTURE;  Surgeon: Dennie Bible, MD;  Location: [place];  Service: Plastics;  Laterality: Left;    Family History  Problem Relation Age of Onset  . Colon cancer Neg Hx   Does not know much about his family history except for his mother who did not have colon cancer.  She died after having a heart attack while driving and  suffered a head injury.  Social History   Socioeconomic History  . Marital status: Married    Spouse name: Not on file  . Number of children: Not on file  . Years of education: Not on file  . Highest education level: Not on file  Occupational History  . Not on file  Social Needs  . Financial resource strain: Not on file  . Food insecurity:    Worry: Not on file    Inability: Not on file  . Transportation needs:    Medical: Not on file    Non-medical: Not on file  Tobacco Use  . Smoking status: Current Every Day Smoker    Packs/day: 1.00    Years: 40.00    Pack years: 40.00    Start date: 09/12/1962  . Smokeless  tobacco: Never Used  Substance and Sexual Activity  . Alcohol use: Not Currently  . Drug use: No  . Sexual activity: Yes    Partners: Female  Lifestyle  . Physical activity:    Days per week: Not on file    Minutes per session: Not on file  . Stress: Not on file  Relationships  . Social connections:    Talks on phone: Not on file    Gets together: Not on file    Attends religious service: Not on file    Active member of club or organization: Not on file    Attends meetings of clubs or organizations: Not on file    Relationship status: Not on file  . Intimate partner violence:    Fear of current or ex partner: Not on file    Emotionally abused: Not on file    Physically abused: Not on file    Forced sexual activity: Not on file  Other Topics Concern  . Not on file  Social History Narrative  . Not on file      ROS:  General: Negative for anorexia, weight loss, fever, chills, fatigue, weakness. Eyes: Negative for vision changes.  ENT: Negative for hoarseness, difficulty swallowing , nasal congestion. CV: Negative for chest pain, angina, palpitations, dyspnea on exertion, peripheral edema.  Respiratory: Negative for dyspnea at rest, dyspnea on exertion, cough, sputum, wheezing.  GI: See history of present illness. GU:  Negative for dysuria, hematuria, urinary incontinence, urinary frequency, nocturnal urination.  MS: Chronic left hand pain. Derm: Negative for rash or itching.  Neuro: Negative for weakness, abnormal sensation, seizure, frequent headaches, memory loss, confusion.  Psych: Negative for anxiety, depression, suicidal ideation, hallucinations.  Endo: Negative for unusual weight change.  Heme: Negative for bruising or bleeding. Allergy: Negative for rash or hives.    Physical Examination:  BP 104/75   Pulse 77   Temp (!) 97 F (36.1 C) (Oral)   Ht 5\' 8"  (1.727 m)   Wt 131 lb 6.4 oz (59.6 kg)   BMI 19.98 kg/m    General: Well-nourished, well-developed  in no acute distress.  Head: Normocephalic, atraumatic.   Eyes: Conjunctiva pink, no icterus. Mouth: Oropharyngeal mucosa moist and pink , no lesions erythema or exudate. Neck: Supple without thyromegaly, masses, or lymphadenopathy.  Lungs: Clear to auscultation bilaterally.  Heart: Regular rate and rhythm, no murmurs rubs or gallops.  Abdomen: Bowel sounds are normal, nontender, nondistended, no hepatosplenomegaly or masses, no abdominal bruits or    hernia , no rebound or guarding.   Rectal: Deferred Extremities: No lower extremity edema. No clubbing or deformities.  Neuro: Alert and oriented x 4 , grossly  normal neurologically.  Skin: Warm and dry, no rash or jaundice.   Psych: Alert and cooperative, normal mood and affect.  Labs: Lab Results  Component Value Date   CREATININE 1.66 (H) 06/25/2017   BUN 21 06/25/2017   NA 139 06/25/2017   K 4.7 06/25/2017   CL 106 06/25/2017   CO2 19 (L) 06/25/2017   Lab Results  Component Value Date   ALT 14 06/25/2017   AST 17 06/25/2017   ALKPHOS 67 06/25/2017   BILITOT 0.2 06/25/2017      Imaging Studies: No results found.

## 2017-09-08 NOTE — Patient Instructions (Signed)
PA info for TCS submitted via [facility] website. Case approved. PA# [id number].

## 2017-09-08 NOTE — Patient Instructions (Addendum)
1. Trial of Amitiza 39mcg twice daily with food for constipation. RX provided. We did not have samples of the 32mcg capsules BUT we have provided you with 4mcg samples (take two capsules with food twice daily). Be aware, when you have your RX filled, you will only take [place]. If not helpful, call and we can try other options. It is important that we get your bowels moving prior to upcoming colonoscopy so your prep is good. 2. Colonoscopy as planned. See separate instructions.

## 2017-09-08 NOTE — Assessment & Plan Note (Signed)
Initially we will try amities a 24 mcg twice daily with food.  Prescription provided.  We only had samples of 8 mcg dose which we provided him to try, will take to twice a day.  He will let me know if his constipation is not well managed.

## 2017-09-08 NOTE — Progress Notes (Signed)
cc'ed to pcp °

## 2017-09-08 NOTE — Telephone Encounter (Signed)
Called and informed pt of pre-op appt 10/22/17 at 1:45pm. Letter mailed.

## 2017-09-08 NOTE — Assessment & Plan Note (Signed)
Do surveillance colonoscopy at this time.  Plan for deep sedation given polypharmacy.  I have discussed the risks, alternatives, benefits with regards to but not limited to the risk of reaction to medication, bleeding, infection, perforation and the patient is agreeable to proceed. Written consent to be obtained.  Patient is aware that if his constipation is not well managed with change in therapy, to let us know so that we can ensure adequate bowel preparation for his colonoscopy.

## 2017-09-09 ENCOUNTER — Telehealth: Payer: Self-pay

## 2017-09-09 NOTE — Telephone Encounter (Signed)
Amitiza 78mcg isn't covered under pts plan. Preferred agents are Laculose, Linzess and Symproic. Pt has only tried Miralax per pts chart. Please advise.

## 2017-09-10 MED ORDER — LINACLOTIDE 145 MCG PO CAPS: 145.0000 ug | ORAL_CAPSULE | Freq: Every day | ORAL | 5 refills | Status: DC

## 2017-09-10 NOTE — Addendum Note (Signed)
Addended by: Mahala Menghini on: 09/10/2017 12:48 PM   Modules accepted: Orders

## 2017-09-10 NOTE — Telephone Encounter (Signed)
Please let patient know. He can take samples provided of Amitiza if he wants. Then we he is out, we can try Linzess 183mcg daily on empty stomach. Provide him with samples to try first, #10. Then he can go for RX. I have sent in RX for Linzess.

## 2017-09-10 NOTE — Telephone Encounter (Signed)
Pt notified. Samples are ready for pick up. Pt will try samples before getting rx. Pt will call back if samples aren't working.

## 2017-09-25 ENCOUNTER — Ambulatory Visit (INDEPENDENT_AMBULATORY_CARE_PROVIDER_SITE_OTHER): Payer: 59 | Admitting: Family Medicine

## 2017-09-25 ENCOUNTER — Encounter: Payer: Self-pay | Admitting: Family Medicine

## 2017-09-25 VITALS — BP 108/80 | Ht 67.0 in | Wt 129.0 lb

## 2017-09-25 DIAGNOSIS — N183 Chronic kidney disease, stage 3 unspecified: Secondary | ICD-10-CM

## 2017-09-25 DIAGNOSIS — G43009 Migraine without aura, not intractable, without status migrainosus: Secondary | ICD-10-CM

## 2017-09-25 DIAGNOSIS — J441 Chronic obstructive pulmonary disease with (acute) exacerbation: Secondary | ICD-10-CM

## 2017-09-25 DIAGNOSIS — Z23 Encounter for immunization: Secondary | ICD-10-CM

## 2017-09-25 DIAGNOSIS — M792 Neuralgia and neuritis, unspecified: Secondary | ICD-10-CM | POA: Diagnosis not present

## 2017-09-25 MED ORDER — OXYCODONE-ACETAMINOPHEN 7.5-325 MG PO TABS: 1.0000 | ORAL_TABLET | Freq: Three times a day (TID) | ORAL | 0 refills | Status: DC | PRN

## 2017-09-25 MED ORDER — PANTOPRAZOLE SODIUM 40 MG PO TBEC: 40.0000 mg | DELAYED_RELEASE_TABLET | Freq: Every day | ORAL | 3 refills | Status: DC

## 2017-09-25 MED ORDER — RANITIDINE HCL 300 MG PO TABS: 300.0000 mg | ORAL_TABLET | Freq: Every day | ORAL | 1 refills | Status: DC

## 2017-09-25 MED ORDER — ALBUTEROL SULFATE HFA 108 (90 BASE) MCG/ACT IN AERS: 2.0000 | INHALATION_SPRAY | Freq: Four times a day (QID) | RESPIRATORY_TRACT | 2 refills | Status: DC | PRN

## 2017-09-25 MED ORDER — TOPIRAMATE 25 MG PO TABS: ORAL_TABLET | ORAL | 5 refills | Status: DC

## 2017-09-25 NOTE — Progress Notes (Signed)
   Subjective:    Patient ID: Dustin Walker, male    DOB: June 01, 1950, 67 y.o.   MRN: [id number]  HPI This patient was seen today for chronic pain  The medication list was reviewed and updated.   -Compliance with medication: Yes  - Number patient states they take daily: 3 per day  -when was the last dose patient took? This am around 6:30 am  The patient was advised the importance of maintaining medication and not using illegal substances with these.  Here for refills and follow up  The patient was educated that we can provide 3 monthly scripts for their medication, it is their responsibility to follow the instructions.  Side effects or complications from medications: No Patient is aware that pain medications are meant to minimize the severity of the pain to allow their pain levels to improve to allow for better function. They are aware of that pain medications cannot totally remove their pain.  Due for UDT ( at least once per year) : last one august 2018   Copd, breathing ok bu not great, gets weezing on occasion, some sob with exertion Uses inhaler sporadically   Migraine headaches good, topamax def helps a lot, sticking with it   .pdpai Patient compliant with pain medication. Continues to experience the pain which led to initiation of analgesic intervention. No significant negative side effects. States definitely needs the pain medication to maintain current level of functioning. Does not receive controlled substance pain medication elsewhere.   Having ongoing changes of pain and stiffness in the hands    Patient has migraine headaches.1 generally very good control with current medication.  No obvious side effects.  Definitely wishes to stay on medication.  Known COPD.  Patient no longer smoking.  Substantial shortness of breath with exertion.  Trying to exercise but not doing the best.  Rare true wheezing.   Review of Systems No headache, no major weight loss or weight  gain, no chest pain no back pain abdominal pain no change in bowel habits complete ROS otherwise negative     Objective:   Physical Exam  Alert and oriented, vitals reviewed and stable, NAD ENT-TM's and ext canals WNL bilat via otoscopic exam Soft palate, tonsils and post pharynx WNL via oropharyngeal exam Neck-symmetric, no masses; thyroid nonpalpable and nontender Pulmonary-no tachypnea or accessory muscle use; Clear without wheezes via auscultation Card--no abnrml murmurs, rhythm reg and rate WNL Carotid pulses symmetric, without bruits Left hand substantial contractures.  Hypersensitivity grip diminished chronic neuropathic pain left hand.      Assessment & Plan:  1 chronic neuropathic pain left hand.  Patient states pain medicine helpful but did not completely cover things  2.  Migraine headaches.  Much improved on medicines patient definitely wishes to stay on same  3.  COPD.  Fortunately no longer smoking.  Vaccines reviewed.  Will give appropriate pneumonia shot today.  Follow-up in 3 months  Impression: Chronic pain. Patient compliant with medication. No substantial side effects. [place] controlled substance registry reviewed to ensure compliance and proper use of medication. Patient aware goal of medicine is not complete resolution of pain but to control his symptoms to improve his functional capacity. Aware of potential adverse side effects

## 2017-09-28 ENCOUNTER — Telehealth: Payer: Self-pay | Admitting: *Deleted

## 2017-09-28 NOTE — Telephone Encounter (Signed)
Patient came in and paid the 103.21 balance.

## 2017-10-20 NOTE — Patient Instructions (Signed)
JAYME CHAM  10/20/2017     @PREFPERIOPPHARMACY @   Your procedure is scheduled on  10/29/2017 .  Report to [facility] at  615  A.M.  Call this number if you have problems the morning of surgery:  [phone]   Remember:  No food after midnight.  You may drink clear liquids until (follow the diet and prep instructions given to you by Dr Roseanne Kaufman office)  .  Clear liquids allowed are:                    Water, Juice (non-citric and without pulp), Carbonated beverages, Clear Tea, Black Coffee only, Plain Jell-O only, Gatorade and Plain Popsicles only    Take these medicines the morning of surgery with A SIP OF WATER  Oxycodone, protonix, topamax.    Do not wear jewelry, make-up or nail polish.  Do not wear lotions, powders, or perfumes, or deodorant.  Do not shave 48 hours prior to surgery.  Men may shave face and neck.  Do not bring valuables to the hospital.  [facility] is not responsible for any belongings or valuables.  Contacts, dentures or bridgework may not be worn into surgery.  Leave your suitcase in the car.  After surgery it may be brought to your room.  For patients admitted to the hospital, discharge time will be determined by your treatment team.  Patients discharged the day of surgery will not be allowed to drive home.   Name and phone number of your driver:   family Special instructions:  Follow the diet and prep instructions given to you by Dr Roseanne Kaufman office.  Please read over the following fact sheets that you were given. Anesthesia Post-op Instructions and Care and Recovery After Surgery       Colonoscopy, Adult A colonoscopy is an exam to look at the large intestine. It is done to check for problems, such as:  Lumps (tumors).  Growths (polyps).  Swelling (inflammation).  Bleeding.  What happens before the procedure? Eating and drinking Follow instructions from your doctor about eating and drinking. These instructions may  include:  A few days before the procedure - follow a low-fiber diet. ? Avoid nuts. ? Avoid seeds. ? Avoid dried fruit. ? Avoid raw fruits. ? Avoid vegetables.  1-3 days before the procedure - follow a clear liquid diet. Avoid liquids that have red or purple dye. Drink only clear liquids, such as: ? Clear broth or bouillon. ? Black coffee or tea. ? Clear juice. ? Clear soft drinks or sports drinks. ? Gelatin dessert. ? Popsicles.  On the day of the procedure - do not eat or drink anything during the 2 hours before the procedure.  Bowel prep If you were prescribed an oral bowel prep:  Take it as told by your doctor. Starting the day before your procedure, you will need to drink a lot of liquid. The liquid will cause you to poop (have bowel movements) until your poop is almost clear or light green.  If your skin or butt gets irritated from diarrhea, you may: ? Wipe the area with wipes that have medicine in them, such as adult wet wipes with aloe and vitamin E. ? Put something on your skin that soothes the area, such as petroleum jelly.  If you throw up (vomit) while drinking the bowel prep, take a break for up to 60 minutes. Then begin the bowel prep again. If  you keep throwing up and you cannot take the bowel prep without throwing up, call your doctor.  General instructions  Ask your doctor about changing or stopping your normal medicines. This is important if you take diabetes medicines or blood thinners.  Plan to have someone take you home from the hospital or clinic. What happens during the procedure?  An IV tube may be put into one of your veins.  You will be given medicine to help you relax (sedative).  To reduce your risk of infection: ? Your doctors will wash their hands. ? Your anal area will be washed with soap.  You will be asked to lie on your side with your knees bent.  Your doctor will get a long, thin, flexible tube ready. The tube will have a camera and a  light on the end.  The tube will be put into your anus.  The tube will be gently put into your large intestine.  Air will be delivered into your large intestine to keep it open. You may feel some pressure or cramping.  The camera will be used to take photos.  A small tissue sample may be removed from your body to be looked at under a microscope (biopsy). If any possible problems are found, the tissue will be sent to a lab for testing.  If small growths are found, your doctor may remove them and have them checked for cancer.  The tube that was put into your anus will be slowly removed. The procedure may vary among doctors and hospitals. What happens after the procedure?  Your doctor will check on you often until the medicines you were given have worn off.  Do not drive for 24 hours after the procedure.  You may have a small amount of blood in your poop.  You may pass gas.  You may have mild cramps or bloating in your belly (abdomen).  It is up to you to get the results of your procedure. Ask your doctor, or the department performing the procedure, when your results will be ready. This information is not intended to replace advice given to you by your health care provider. Make sure you discuss any questions you have with your health care provider. Document Released: 05/31/2010 Document Revised: 02/27/2016 Document Reviewed: 07/10/2015 Elsevier Interactive Patient Education  2017 [employer]  Colonoscopy, Adult, Care After This sheet gives you information about how to care for yourself after your procedure. Your health care provider may also give you more specific instructions. If you have problems or questions, contact your health care provider. What can I expect after the procedure? After the procedure, it is common to have:  A small amount of blood in your stool for 24 hours after the procedure.  Some gas.  Mild abdominal cramping or bloating.  Follow these  instructions at home: General instructions   For the first 24 hours after the procedure: ? Do not drive or use machinery. ? Do not sign important documents. ? Do not drink alcohol. ? Do your regular daily activities at a slower pace than normal. ? Eat soft, easy-to-digest foods. ? Rest often.  Take over-the-counter or prescription medicines only as told by your health care provider.  It is up to you to get the results of your procedure. Ask your health care provider, or the department performing the procedure, when your results will be ready. Relieving cramping and bloating  Try walking around when you have cramps or feel bloated.  Apply heat  to your abdomen as told by your health care provider. Use a heat source that your health care provider recommends, such as a moist heat pack or a heating pad. ? Place a towel between your skin and the heat source. ? Leave the heat on for 20-30 minutes. ? Remove the heat if your skin turns bright red. This is especially important if you are unable to feel pain, heat, or cold. You may have a greater risk of getting burned. Eating and drinking  Drink enough fluid to keep your urine clear or pale yellow.  Resume your normal diet as instructed by your health care provider. Avoid heavy or fried foods that are hard to digest.  Avoid drinking alcohol for as long as instructed by your health care provider. Contact a health care provider if:  You have blood in your stool 2-3 days after the procedure. Get help right away if:  You have more than a small spotting of blood in your stool.  You pass large blood clots in your stool.  Your abdomen is swollen.  You have nausea or vomiting.  You have a fever.  You have increasing abdominal pain that is not relieved with medicine. This information is not intended to replace advice given to you by your health care provider. Make sure you discuss any questions you have with your health care  provider. Document Released: 12/11/2003 Document Revised: 01/21/2016 Document Reviewed: 07/10/2015 Elsevier Interactive Patient Education  2018 [place] Anesthesia is a term that refers to techniques, procedures, and medicines that help a person stay safe and comfortable during a medical procedure. Monitored anesthesia care, or sedation, is one type of anesthesia. Your anesthesia specialist may recommend sedation if you will be having a procedure that does not require you to be unconscious, such as:  Cataract surgery.  A dental procedure.  A biopsy.  A colonoscopy.  During the procedure, you may receive a medicine to help you relax (sedative). There are three levels of sedation:  Mild sedation. At this level, you may feel awake and relaxed. You will be able to follow directions.  Moderate sedation. At this level, you will be sleepy. You may not remember the procedure.  Deep sedation. At this level, you will be asleep. You will not remember the procedure.  The more medicine you are given, the deeper your level of sedation will be. Depending on how you respond to the procedure, the anesthesia specialist may change your level of sedation or the type of anesthesia to fit your needs. An anesthesia specialist will monitor you closely during the procedure. Let your health care provider know about:  Any allergies you have.  All medicines you are taking, including vitamins, herbs, eye drops, creams, and over-the-counter medicines.  Any use of steroids (by mouth or as a cream).  Any problems you or family members have had with sedatives and anesthetic medicines.  Any blood disorders you have.  Any surgeries you have had.  Any medical conditions you have, such as sleep apnea.  Whether you are pregnant or may be pregnant.  Any use of cigarettes, alcohol, or street drugs. What are the risks? Generally, this is a safe procedure. However, problems may  occur, including:  Getting too much medicine (oversedation).  Nausea.  Allergic reaction to medicines.  Trouble breathing. If this happens, a breathing tube may be used to help with breathing. It will be removed when you are awake and breathing on your own.  Heart  trouble.  Lung trouble.  Before the procedure Staying hydrated Follow instructions from your health care provider about hydration, which may include:  Up to 2 hours before the procedure - you may continue to drink clear liquids, such as water, clear fruit juice, black coffee, and plain tea.  Eating and drinking restrictions Follow instructions from your health care provider about eating and drinking, which may include:  8 hours before the procedure - stop eating heavy meals or foods such as meat, fried foods, or fatty foods.  6 hours before the procedure - stop eating light meals or foods, such as toast or cereal.  6 hours before the procedure - stop drinking milk or drinks that contain milk.  2 hours before the procedure - stop drinking clear liquids.  Medicines Ask your health care provider about:  Changing or stopping your regular medicines. This is especially important if you are taking diabetes medicines or blood thinners.  Taking medicines such as aspirin and ibuprofen. These medicines can thin your blood. Do not take these medicines before your procedure if your health care provider instructs you not to.  Tests and exams  You will have a physical exam.  You may have blood tests done to show: ? How well your kidneys and liver are working. ? How well your blood can clot.  General instructions  Plan to have someone take you home from the hospital or clinic.  If you will be going home right after the procedure, plan to have someone with you for 24 hours.  What happens during the procedure?  Your blood pressure, heart rate, breathing, level of pain and overall condition will be monitored.  An IV  tube will be inserted into one of your veins.  Your anesthesia specialist will give you medicines as needed to keep you comfortable during the procedure. This may mean changing the level of sedation.  The procedure will be performed. After the procedure  Your blood pressure, heart rate, breathing rate, and blood oxygen level will be monitored until the medicines you were given have worn off.  Do not drive for 24 hours if you received a sedative.  You may: ? Feel sleepy, clumsy, or nauseous. ? Feel forgetful about what happened after the procedure. ? Have a sore throat if you had a breathing tube during the procedure. ? Vomit. This information is not intended to replace advice given to you by your health care provider. Make sure you discuss any questions you have with your health care provider. Document Released: 01/22/2005 Document Revised: 10/05/2015 Document Reviewed: 08/19/2015 Elsevier Interactive Patient Education  2018 [place], Care After These instructions provide you with information about caring for yourself after your procedure. Your health care provider may also give you more specific instructions. Your treatment has been planned according to current medical practices, but problems sometimes occur. Call your health care provider if you have any problems or questions after your procedure. What can I expect after the procedure? After your procedure, it is common to:  Feel sleepy for several hours.  Feel clumsy and have poor balance for several hours.  Feel forgetful about what happened after the procedure.  Have poor judgment for several hours.  Feel nauseous or vomit.  Have a sore throat if you had a breathing tube during the procedure.  Follow these instructions at home: For at least 24 hours after the procedure:   Do not: ? Participate in activities in which you could fall or  become injured. ? Drive. ? Use heavy  machinery. ? Drink alcohol. ? Take sleeping pills or medicines that cause drowsiness. ? Make important decisions or sign legal documents. ? Take care of children on your own.  Rest. Eating and drinking  Follow the diet that is recommended by your health care provider.  If you vomit, drink water, juice, or soup when you can drink without vomiting.  Make sure you have little or no nausea before eating solid foods. General instructions  Have a responsible adult stay with you until you are awake and alert.  Take over-the-counter and prescription medicines only as told by your health care provider.  If you smoke, do not smoke without supervision.  Keep all follow-up visits as told by your health care provider. This is important. Contact a health care provider if:  You keep feeling nauseous or you keep vomiting.  You feel light-headed.  You develop a rash.  You have a fever. Get help right away if:  You have trouble breathing. This information is not intended to replace advice given to you by your health care provider. Make sure you discuss any questions you have with your health care provider. Document Released: 08/19/2015 Document Revised: 12/19/2015 Document Reviewed: 08/19/2015 Elsevier Interactive Patient Education  [employer].

## 2017-10-22 ENCOUNTER — Encounter (HOSPITAL_COMMUNITY): Payer: Self-pay

## 2017-10-22 ENCOUNTER — Encounter (HOSPITAL_COMMUNITY)
Admission: RE | Admit: 2017-10-22 | Discharge: 2017-10-22 | Disposition: A | Discharge status: Home / Self Care | Payer: 59 | Source: Ambulatory Visit | Attending: Internal Medicine | Admitting: Internal Medicine

## 2017-10-22 ENCOUNTER — Other Ambulatory Visit: Payer: Self-pay

## 2017-10-22 DIAGNOSIS — Z01810 Encounter for preprocedural cardiovascular examination: Secondary | ICD-10-CM | POA: Diagnosis not present

## 2017-10-22 DIAGNOSIS — Z01812 Encounter for preprocedural laboratory examination: Secondary | ICD-10-CM | POA: Insufficient documentation

## 2017-10-22 HISTORY — DX: Unspecified osteoarthritis, unspecified site: M19.90

## 2017-10-22 LAB — CBC WITH DIFFERENTIAL/PLATELET
BASOS PCT: 1 %
Basophils Absolute: 0 10*3/uL (ref 0.0–0.1)
Eosinophils Absolute: 0.2 10*3/uL (ref 0.0–0.7)
Eosinophils Relative: 3 %
HCT: 41.8 % (ref 39.0–52.0)
HEMOGLOBIN: 13.4 g/dL (ref 13.0–17.0)
LYMPHS ABS: 1.7 10*3/uL (ref 0.7–4.0)
Lymphocytes Relative: 33 %
MCH: 29.9 pg (ref 26.0–34.0)
MCHC: 32.1 g/dL (ref 30.0–36.0)
MCV: 93.3 fL (ref 78.0–100.0)
MONOS PCT: 9 %
Monocytes Absolute: 0.5 10*3/uL (ref 0.1–1.0)
NEUTROS PCT: 54 %
Neutro Abs: 2.9 10*3/uL (ref 1.7–7.7)
PLATELETS: 224 10*3/uL (ref 150–400)
RBC: 4.48 MIL/uL (ref 4.22–5.81)
RDW: 13.4 % (ref 11.5–15.5)
WBC: 5.3 10*3/uL (ref 4.0–10.5)

## 2017-10-22 LAB — BASIC METABOLIC PANEL
ANION GAP: 7 (ref 5–15)
BUN: 23 mg/dL — AB (ref 6–20)
CALCIUM: 8.6 mg/dL — AB (ref 8.9–10.3)
CO2: 23 mmol/L (ref 22–32)
Chloride: 103 mmol/L (ref 101–111)
Creatinine, Ser: 1.54 mg/dL — ABNORMAL HIGH (ref 0.61–1.24)
GFR calc Af Amer: 53 mL/min — ABNORMAL LOW (ref >60)
GFR calc non Af Amer: 46 mL/min — ABNORMAL LOW (ref >60)
Glucose, Bld: 112 mg/dL — ABNORMAL HIGH (ref 65–99)
Potassium: 4 mmol/L (ref 3.5–5.1)
SODIUM: 133 mmol/L — AB (ref 135–145)

## 2017-10-28 ENCOUNTER — Encounter (HOSPITAL_COMMUNITY): Payer: Self-pay | Admitting: Anesthesiology

## 2017-10-29 ENCOUNTER — Encounter: Payer: Self-pay | Admitting: Internal Medicine

## 2017-10-29 ENCOUNTER — Telehealth: Payer: Self-pay

## 2017-10-29 ENCOUNTER — Ambulatory Visit (HOSPITAL_COMMUNITY)
Admission: RE | Admit: 2017-10-29 | Discharge: 2017-10-29 | Disposition: A | Discharge status: Home / Self Care | Payer: 59 | Source: Ambulatory Visit | Attending: Internal Medicine | Admitting: Internal Medicine

## 2017-10-29 ENCOUNTER — Other Ambulatory Visit: Payer: Self-pay | Admitting: Family Medicine

## 2017-10-29 ENCOUNTER — Encounter (HOSPITAL_COMMUNITY)
Admission: RE | Disposition: A | Discharge status: Home / Self Care | Payer: Self-pay | Source: Ambulatory Visit | Attending: Internal Medicine

## 2017-10-29 ENCOUNTER — Encounter (HOSPITAL_COMMUNITY): Payer: Self-pay | Admitting: *Deleted

## 2017-10-29 DIAGNOSIS — Z86010 Personal history of colonic polyps: Secondary | ICD-10-CM | POA: Diagnosis not present

## 2017-10-29 DIAGNOSIS — Z538 Procedure and treatment not carried out for other reasons: Secondary | ICD-10-CM | POA: Diagnosis not present

## 2017-10-29 SURGERY — COLONOSCOPY WITH PROPOFOL
Anesthesia: Monitor Anesthesia Care

## 2017-10-29 MED ORDER — CHLORHEXIDINE GLUCONATE CLOTH 2 % EX PADS: 6.0000 | MEDICATED_PAD | Freq: Once | CUTANEOUS | Status: DC

## 2017-10-29 NOTE — Telephone Encounter (Signed)
Stacey, please schedule ov.  

## 2017-10-29 NOTE — Progress Notes (Addendum)
Patient arrived today for colonoscopy, states" I didn't start having my bowels to move until 3:30 this am." Trylite split prep along with ducolax and enema. Gave fleets this am, dark brown water with pieces of formed stools noted. Awaiting to speak with Dr. Gala Romney.  Dr. Gala Romney in to talk with patient and wife, procedure cancelled for today.

## 2017-10-29 NOTE — Telephone Encounter (Signed)
-----   Message from Daneil Dolin, MD sent at 10/29/2017  7:41 AM EDT ----- Constipation not adequately managed. Patient was not prepped for colonoscopy today. Procedure canceled. He needs a follow-up appointment in the office. Constipation needs to be managed more effectively (he needs to be taking prescribed medication regularly) before you attempt to reschedule.

## 2017-10-29 NOTE — Telephone Encounter (Signed)
Patient scheduled and letter sent  °

## 2017-12-03 ENCOUNTER — Other Ambulatory Visit: Payer: Self-pay

## 2017-12-03 DIAGNOSIS — D485 Neoplasm of uncertain behavior of skin: Secondary | ICD-10-CM | POA: Diagnosis not present

## 2017-12-03 DIAGNOSIS — D229 Melanocytic nevi, unspecified: Secondary | ICD-10-CM | POA: Diagnosis not present

## 2017-12-03 DIAGNOSIS — L820 Inflamed seborrheic keratosis: Secondary | ICD-10-CM | POA: Diagnosis not present

## 2017-12-28 ENCOUNTER — Ambulatory Visit (INDEPENDENT_AMBULATORY_CARE_PROVIDER_SITE_OTHER): Payer: 59 | Admitting: Family Medicine

## 2017-12-28 ENCOUNTER — Encounter: Payer: Self-pay | Admitting: Family Medicine

## 2017-12-28 VITALS — BP 102/70 | Ht 67.0 in | Wt 126.2 lb

## 2017-12-28 DIAGNOSIS — G43009 Migraine without aura, not intractable, without status migrainosus: Secondary | ICD-10-CM | POA: Diagnosis not present

## 2017-12-28 DIAGNOSIS — M792 Neuralgia and neuritis, unspecified: Secondary | ICD-10-CM | POA: Diagnosis not present

## 2017-12-28 DIAGNOSIS — N183 Chronic kidney disease, stage 3 unspecified: Secondary | ICD-10-CM

## 2017-12-28 DIAGNOSIS — J441 Chronic obstructive pulmonary disease with (acute) exacerbation: Secondary | ICD-10-CM

## 2017-12-28 DIAGNOSIS — Z79891 Long term (current) use of opiate analgesic: Secondary | ICD-10-CM

## 2017-12-28 MED ORDER — OXYCODONE-ACETAMINOPHEN 7.5-325 MG PO TABS: 1.0000 | ORAL_TABLET | Freq: Three times a day (TID) | ORAL | 0 refills | Status: DC

## 2017-12-28 NOTE — Progress Notes (Signed)
   Subjective:    Patient ID: Dustin Walker, male    DOB: 08/07/1950, 67 y.o.   MRN: [id number]  HPI This patient was seen today for chronic pain  The medication list was reviewed and updated.   -Compliance with medication: yes  - Number patient states they take daily: 3 Oxycodone   -when was the last dose patient took?  This morning  The patient was advised the importance of maintaining medication and not using illegal substances with these.  Here for refills and follow up  The patient was educated that we can provide 3 monthly scripts for their medication, it is their responsibility to follow the instructions.  Side effects or complications from medications: none  Patient is aware that pain medications are meant to minimize the severity of the pain to allow their pain levels to improve to allow for better function. They are aware of that pain medications cannot totally remove their pain.  Due for UDT ( at least once per year) : done today  Patient compliant with pain medication. Continues to experience the pain which led to initiation of analgesic intervention. No significant negative side effects. States definitely needs the pain medication to maintain current level of functioning. Does not receive controlled substance pain medication elsewhere.  Copd , ongoing. Overall doing fairly good . Still smoking a pacj or less,.  Unfortunately still smoking.  Reflux overall better handling the medication overall well.  Ongoing headaches.  Not much improved on medication.   Night time the ned di bit helo aw much                  Review of Systems No headache, no major weight loss or weight gain, no chest pain no back pain abdominal pain no change in bowel habits complete ROS otherwise negative     Objective:   Physical Exam  Alert and oriented, vitals reviewed and stable, NAD ENT-TM's and ext canals WNL bilat via otoscopic exam Soft palate, tonsils and post  pharynx WNL via oropharyngeal exam Neck-symmetric, no masses; thyroid nonpalpable and nontender Pulmonary-no tachypnea or accessory muscle use; Clear without wheezes via auscultation Card--no abnrml murmurs, rhythm reg and rate WNL Carotid pulses symmetric, without bruits Left hand partial call malformation.  Pulses good.  Hypersensitivity to light touch impression chronic neuropathic pain discussed patient wishes maintain on same medications for now.  2.  COPD.  Unfortunately still smoking.  Symptom care discussed as needed use of albuterol.  Exercise encouraged  3.  Chronic headaches.  Ongoing need for meds.  Maintain normal.  Maintain ~rationale discussed  Urine drug screen plus new chronic pain contract filled out  Follow-up in 3 months      Assessment & Plan:

## 2017-12-31 LAB — TOXASSURE SELECT 13 (MW), URINE

## 2018-01-27 ENCOUNTER — Other Ambulatory Visit: Payer: Self-pay

## 2018-01-27 ENCOUNTER — Telehealth: Payer: Self-pay

## 2018-01-27 ENCOUNTER — Encounter: Payer: Self-pay | Admitting: Gastroenterology

## 2018-01-27 ENCOUNTER — Ambulatory Visit (INDEPENDENT_AMBULATORY_CARE_PROVIDER_SITE_OTHER): Payer: 59 | Admitting: Gastroenterology

## 2018-01-27 VITALS — BP 113/72 | HR 65 | Temp 98.2°F | Ht 68.0 in | Wt 129.4 lb

## 2018-01-27 DIAGNOSIS — Z86010 Personal history of colonic polyps: Secondary | ICD-10-CM | POA: Diagnosis not present

## 2018-01-27 DIAGNOSIS — K5903 Drug induced constipation: Secondary | ICD-10-CM

## 2018-01-27 DIAGNOSIS — T402X5A Adverse effect of other opioids, initial encounter: Secondary | ICD-10-CM | POA: Diagnosis not present

## 2018-01-27 MED ORDER — NA SULFATE-K SULFATE-MG SULF 17.5-3.13-1.6 GM/177ML PO SOLN: 1.0000 | ORAL | 0 refills | Status: DC

## 2018-01-27 NOTE — Telephone Encounter (Signed)
Called and informed pt of pre-op appt 02/17/18 at 1:45pm. Letter mailed.

## 2018-01-27 NOTE — Patient Instructions (Signed)
1. You can continue to use Clearlax up to 1 to 2 times daily to keep stools soft and moving.  2. We advise a colonoscopy because you have a history of precancerous polyps. We have scheduled you for a colonoscopy today. See separate instructions.  3. Ten days before your colonoscopy, start Amitiza 76mcg one capsule twice daily for food to make sure your bowels are moving well BEFORE you take your colonoscopy prep. We have provided you with samples for this purpose, please save these samples until two weeks before your colonoscopy.

## 2018-01-27 NOTE — H&P (View-Only) (Signed)
Primary Care Physician: Mikey Kirschner, MD  Primary Gastroenterologist:  Garfield Cornea, MD   Chief Complaint  Patient presents with  . Constipation    BM's daily as long as he takes "clearlax". was not previously cleaned out for TCS    HPI: Dustin Walker is a 67 y.o. male here for follow-up of constipation and to reschedule surveillance colonoscopy.  Seen in April 2019 and arrange for surveillance colonoscopy at that time, 4 years behind schedule as he was due in 2015.  He has constipation in the setting of oxycodone use.  We started him on Amitiza 8 mcg twice daily samples provided.  Worked well for him.  Unfortunately insurance required trial of lactulose, Linzess, or Symproic.  We started Linzess 145 mcg daily.  Patient states it never really worked.  He has been using Clearlax intermittently with decent results of the 1 to 2 weeks leading up until his colonoscopy prep he had pretty significant constipation and was not taking anything.  He took for the first half of TriLyte and did not have a bowel movement until 3:30 AM the next morning.  He did take the second dose but it did not have time to work.  His colonoscopy was canceled due to poor prep.  At this point he is using Clearlax if he does not have a bowel movement in 2 to 3 days.  After utilizing Clearlax for couple of days and he has 2-3 stools daily but then he will stop it and give himself a "break".  After 2 to 3 days without a BM that he restarts Clearlax.  He states is fairly inexpensive for him.  He does not want to try prescription options due to expense.  He denies any melena.  Rare bright red blood when he wipes.  No abdominal pain.  No upper GI symptoms.   Current Outpatient Medications  Medication Sig Dispense Refill  . albuterol (PROVENTIL HFA;VENTOLIN HFA) 108 (90 Base) MCG/ACT inhaler Inhale 2 puffs into the lungs every 6 (six) hours as needed for wheezing or shortness of breath. 1 Inhaler 2  . ibuprofen  (ADVIL,MOTRIN) 800 MG tablet TAKE 1 TABLET BY MOUTH TWO  TIMES DAILY AS NEEDED (Patient taking differently: Take 800 mg by mouth 2 (two) times daily. ) 240 tablet 3  . nortriptyline (PAMELOR) 50 MG capsule TAKE 2 CAPSULES BY MOUTH AT BEDTIME 60 capsule 11  . oxyCODONE-acetaminophen (PERCOCET) 7.5-325 MG tablet Take 1 tablet by mouth 3 (three) times daily. 90 tablet 0  . pantoprazole (PROTONIX) 40 MG tablet Take 1 tablet (40 mg total) by mouth daily. 90 tablet 3  . polyethylene glycol powder (CLEARLAX) powder Once every 2-3 days    . ranitidine (ZANTAC) 300 MG tablet Take 1 tablet (300 mg total) by mouth daily. (Patient taking differently: Take 300 mg by mouth every evening. ) 90 tablet 1  . topiramate (TOPAMAX) 25 MG tablet TAKE 2 TABLETS BY MOUTH TWO TIMES DAILY 360 tablet 5   No current facility-administered medications for this visit.     Allergies as of 01/27/2018 - Review Complete 01/27/2018  Allergen Reaction Noted  . Naproxen sodium [naproxen sodium] Rash 06/19/2011  . Codeine Rash 06/19/2011   Past Medical History:  Diagnosis Date  . Allergic rhinitis   . Arthritis   . Chronic pain    left hand, crush injury in 2013  . COPD (chronic obstructive pulmonary disease) ([place])    Past Surgical History:  Procedure Laterality  Date  . COLONOSCOPY  2010   Dr. Gala Romney: tubular adenoma removed. was due for surveillance in 2015.  . I&D EXTREMITY  06/19/2011   Procedure: IRRIGATION AND DEBRIDEMENT EXTREMITY;  Surgeon: Dennie Bible, MD;  Location: [facility];  Service: Plastics;;  . KNEE SURGERY Bilateral    6 times per knee and total knee replacement on the left  . ORIF ULNAR FRACTURE  06/19/2011   Procedure: OPEN REDUCTION INTERNAL FIXATION (ORIF) ULNAR FRACTURE;  Surgeon: Dennie Bible, MD;  Location: [facility];  Service: Plastics;  Laterality: Left;   Family History  Problem Relation Age of Onset  . Colon cancer Neg Hx    Social History   Tobacco Use  . Smoking status: Current Every Day  Smoker    Packs/day: 1.00    Years: 40.00    Pack years: 40.00    Start date: 09/12/1962  . Smokeless tobacco: Never Used  Substance Use Topics  . Alcohol use: Not Currently  . Drug use: No    ROS:  General: Negative for anorexia, weight loss, fever, chills, fatigue, weakness. ENT: Negative for hoarseness, difficulty swallowing , nasal congestion. CV: Negative for chest pain, angina, palpitations, dyspnea on exertion, peripheral edema.  Respiratory: Negative for dyspnea at rest, dyspnea on exertion, cough, sputum, wheezing.  GI: See history of present illness. GU:  Negative for dysuria, hematuria, urinary incontinence, urinary frequency, nocturnal urination.  Endo: Negative for unusual weight change.    Physical Examination:   BP 113/72   Pulse 65   Temp 98.2 F (36.8 C) (Oral)   Ht 5\' 8"  (1.727 m)   Wt 129 lb 6.4 oz (58.7 kg)   BMI 19.68 kg/m   General: Well-nourished, well-developed in no acute distress.  Eyes: No icterus. Mouth: Oropharyngeal mucosa moist and pink , no lesions erythema or exudate. Lungs: Clear to auscultation bilaterally.  Heart: Regular rate and rhythm, no murmurs rubs or gallops.  Abdomen: Bowel sounds are normal, nontender, nondistended, no hepatosplenomegaly or masses, no abdominal bruits or hernia , no rebound or guarding.   Extremities: No lower extremity edema. No clubbing or deformities. Neuro: Alert and oriented x 4   Skin: Warm and dry, no jaundice.   Psych: Alert and cooperative, normal mood and affect.

## 2018-01-27 NOTE — Assessment & Plan Note (Addendum)
Overdue for surveillance colonoscopy.  Recent colonoscopy canceled because of poor prep.  Patient states Clearlax is dependable, and always has multiple bowel movements when he starts taking it.  States he cannot take it daily or he has to frequent stools.  For chronic regimen I would recommend he take Clearlax 1-2 times daily as needed to keep stools soft.  However given failed bowel prep previously.  He reports being constipated for couple weeks prior to his procedure.Prior to his next colonoscopy I would recommend him taking amitiza 24 mcg twice daily with food for 10 days before his bowel prep starts.  We provided him with ample samples to reserve for his bowel prep.   Colonoscopy in the near future.  Deep sedation plan given polypharmacy.  I have discussed the risks, alternatives, benefits with regards to but not limited to the risk of reaction to medication, bleeding, infection, perforation and the patient is agreeable to proceed. Written consent to be obtained.

## 2018-01-27 NOTE — Patient Instructions (Signed)
PA for TCS submitted via [facility] website. Case approved. PA# [id number].

## 2018-01-27 NOTE — Progress Notes (Signed)
Primary Care Physician: Mikey Kirschner, MD  Primary Gastroenterologist:  Garfield Cornea, MD   Chief Complaint  Patient presents with  . Constipation    BM's daily as long as he takes "clearlax". was not previously cleaned out for TCS    HPI: Dustin Walker is a 67 y.o. male here for follow-up of constipation and to reschedule surveillance colonoscopy.  Seen in April 2019 and arrange for surveillance colonoscopy at that time, 4 years behind schedule as he was due in 2015.  He has constipation in the setting of oxycodone use.  We started him on Amitiza 8 mcg twice daily samples provided.  Worked well for him.  Unfortunately insurance required trial of lactulose, Linzess, or Symproic.  We started Linzess 145 mcg daily.  Patient states it never really worked.  He has been using Clearlax intermittently with decent results of the 1 to 2 weeks leading up until his colonoscopy prep he had pretty significant constipation and was not taking anything.  He took for the first half of TriLyte and did not have a bowel movement until 3:30 AM the next morning.  He did take the second dose but it did not have time to work.  His colonoscopy was canceled due to poor prep.  At this point he is using Clearlax if he does not have a bowel movement in 2 to 3 days.  After utilizing Clearlax for couple of days and he has 2-3 stools daily but then he will stop it and give himself a "break".  After 2 to 3 days without a BM that he restarts Clearlax.  He states is fairly inexpensive for him.  He does not want to try prescription options due to expense.  He denies any melena.  Rare bright red blood when he wipes.  No abdominal pain.  No upper GI symptoms.   Current Outpatient Medications  Medication Sig Dispense Refill  . albuterol (PROVENTIL HFA;VENTOLIN HFA) 108 (90 Base) MCG/ACT inhaler Inhale 2 puffs into the lungs every 6 (six) hours as needed for wheezing or shortness of breath. 1 Inhaler 2  . ibuprofen  (ADVIL,MOTRIN) 800 MG tablet TAKE 1 TABLET BY MOUTH TWO  TIMES DAILY AS NEEDED (Patient taking differently: Take 800 mg by mouth 2 (two) times daily. ) 240 tablet 3  . nortriptyline (PAMELOR) 50 MG capsule TAKE 2 CAPSULES BY MOUTH AT BEDTIME 60 capsule 11  . oxyCODONE-acetaminophen (PERCOCET) 7.5-325 MG tablet Take 1 tablet by mouth 3 (three) times daily. 90 tablet 0  . pantoprazole (PROTONIX) 40 MG tablet Take 1 tablet (40 mg total) by mouth daily. 90 tablet 3  . polyethylene glycol powder (CLEARLAX) powder Once every 2-3 days    . ranitidine (ZANTAC) 300 MG tablet Take 1 tablet (300 mg total) by mouth daily. (Patient taking differently: Take 300 mg by mouth every evening. ) 90 tablet 1  . topiramate (TOPAMAX) 25 MG tablet TAKE 2 TABLETS BY MOUTH TWO TIMES DAILY 360 tablet 5   No current facility-administered medications for this visit.     Allergies as of 01/27/2018 - Review Complete 01/27/2018  Allergen Reaction Noted  . Naproxen sodium [naproxen sodium] Rash 06/19/2011  . Codeine Rash 06/19/2011   Past Medical History:  Diagnosis Date  . Allergic rhinitis   . Arthritis   . Chronic pain    left hand, crush injury in 2013  . COPD (chronic obstructive pulmonary disease) ([place])    Past Surgical History:  Procedure Laterality  Date  . COLONOSCOPY  2010   Dr. Gala Romney: tubular adenoma removed. was due for surveillance in 2015.  . I&D EXTREMITY  06/19/2011   Procedure: IRRIGATION AND DEBRIDEMENT EXTREMITY;  Surgeon: Dennie Bible, MD;  Location: [facility];  Service: Plastics;;  . KNEE SURGERY Bilateral    6 times per knee and total knee replacement on the left  . ORIF ULNAR FRACTURE  06/19/2011   Procedure: OPEN REDUCTION INTERNAL FIXATION (ORIF) ULNAR FRACTURE;  Surgeon: Dennie Bible, MD;  Location: [facility];  Service: Plastics;  Laterality: Left;   Family History  Problem Relation Age of Onset  . Colon cancer Neg Hx    Social History   Tobacco Use  . Smoking status: Current Every Day  Smoker    Packs/day: 1.00    Years: 40.00    Pack years: 40.00    Start date: 09/12/1962  . Smokeless tobacco: Never Used  Substance Use Topics  . Alcohol use: Not Currently  . Drug use: No    ROS:  General: Negative for anorexia, weight loss, fever, chills, fatigue, weakness. ENT: Negative for hoarseness, difficulty swallowing , nasal congestion. CV: Negative for chest pain, angina, palpitations, dyspnea on exertion, peripheral edema.  Respiratory: Negative for dyspnea at rest, dyspnea on exertion, cough, sputum, wheezing.  GI: See history of present illness. GU:  Negative for dysuria, hematuria, urinary incontinence, urinary frequency, nocturnal urination.  Endo: Negative for unusual weight change.    Physical Examination:   BP 113/72   Pulse 65   Temp 98.2 F (36.8 C) (Oral)   Ht 5\' 8"  (1.727 m)   Wt 129 lb 6.4 oz (58.7 kg)   BMI 19.68 kg/m   General: Well-nourished, well-developed in no acute distress.  Eyes: No icterus. Mouth: Oropharyngeal mucosa moist and pink , no lesions erythema or exudate. Lungs: Clear to auscultation bilaterally.  Heart: Regular rate and rhythm, no murmurs rubs or gallops.  Abdomen: Bowel sounds are normal, nontender, nondistended, no hepatosplenomegaly or masses, no abdominal bruits or hernia , no rebound or guarding.   Extremities: No lower extremity edema. No clubbing or deformities. Neuro: Alert and oriented x 4   Skin: Warm and dry, no jaundice.   Psych: Alert and cooperative, normal mood and affect.

## 2018-01-28 ENCOUNTER — Telehealth: Payer: Self-pay | Admitting: General Practice

## 2018-01-28 NOTE — Progress Notes (Signed)
CC'D TO PCP °

## 2018-01-28 NOTE — Telephone Encounter (Signed)
Patient came in questioning a bill he received from the hospital for $400.00.  I called [facility] and spoke with [place] and she stated the patient didnt't have a balance.  After speaking with the patient he stated he paid the bill and for me "not to worry about it it's just money".  He thanked me for calling and the call was disconnected.

## 2018-02-16 ENCOUNTER — Encounter (HOSPITAL_COMMUNITY): Payer: Self-pay

## 2018-02-17 ENCOUNTER — Encounter (HOSPITAL_COMMUNITY)
Admission: RE | Admit: 2018-02-17 | Discharge: 2018-02-17 | Disposition: A | Discharge status: Home / Self Care | Payer: 59 | Source: Ambulatory Visit | Attending: Internal Medicine | Admitting: Internal Medicine

## 2018-02-22 ENCOUNTER — Encounter (HOSPITAL_COMMUNITY)
Admission: RE | Disposition: A | Discharge status: Home / Self Care | Payer: Self-pay | Source: Ambulatory Visit | Attending: Internal Medicine

## 2018-02-22 ENCOUNTER — Ambulatory Visit (HOSPITAL_COMMUNITY): Payer: 59 | Admitting: Anesthesiology

## 2018-02-22 ENCOUNTER — Other Ambulatory Visit: Payer: Self-pay

## 2018-02-22 ENCOUNTER — Ambulatory Visit (HOSPITAL_COMMUNITY)
Admission: RE | Admit: 2018-02-22 | Discharge: 2018-02-22 | Disposition: A | Discharge status: Home / Self Care | Payer: 59 | Source: Ambulatory Visit | Attending: Internal Medicine | Admitting: Internal Medicine

## 2018-02-22 ENCOUNTER — Encounter (HOSPITAL_COMMUNITY): Payer: Self-pay | Admitting: *Deleted

## 2018-02-22 DIAGNOSIS — K5900 Constipation, unspecified: Secondary | ICD-10-CM | POA: Diagnosis present

## 2018-02-22 DIAGNOSIS — G8929 Other chronic pain: Secondary | ICD-10-CM | POA: Diagnosis not present

## 2018-02-22 DIAGNOSIS — J449 Chronic obstructive pulmonary disease, unspecified: Secondary | ICD-10-CM | POA: Insufficient documentation

## 2018-02-22 DIAGNOSIS — F17210 Nicotine dependence, cigarettes, uncomplicated: Secondary | ICD-10-CM | POA: Insufficient documentation

## 2018-02-22 DIAGNOSIS — Z79891 Long term (current) use of opiate analgesic: Secondary | ICD-10-CM | POA: Diagnosis not present

## 2018-02-22 DIAGNOSIS — Z79899 Other long term (current) drug therapy: Secondary | ICD-10-CM | POA: Diagnosis not present

## 2018-02-22 DIAGNOSIS — R51 Headache: Secondary | ICD-10-CM | POA: Diagnosis not present

## 2018-02-22 DIAGNOSIS — Z96652 Presence of left artificial knee joint: Secondary | ICD-10-CM | POA: Diagnosis not present

## 2018-02-22 DIAGNOSIS — Q438 Other specified congenital malformations of intestine: Secondary | ICD-10-CM | POA: Diagnosis not present

## 2018-02-22 DIAGNOSIS — Z86010 Personal history of colonic polyps: Secondary | ICD-10-CM | POA: Insufficient documentation

## 2018-02-22 DIAGNOSIS — F17200 Nicotine dependence, unspecified, uncomplicated: Secondary | ICD-10-CM | POA: Diagnosis not present

## 2018-02-22 DIAGNOSIS — N189 Chronic kidney disease, unspecified: Secondary | ICD-10-CM | POA: Diagnosis not present

## 2018-02-22 HISTORY — PX: COLONOSCOPY WITH PROPOFOL: SHX5780

## 2018-02-22 SURGERY — COLONOSCOPY WITH PROPOFOL
Anesthesia: Monitor Anesthesia Care

## 2018-02-22 MED ORDER — PROPOFOL 500 MG/50ML IV EMUL
INTRAVENOUS | Status: DC | PRN
  Administered 2018-02-22: 150 ug/kg/min via INTRAVENOUS

## 2018-02-22 MED ORDER — PROPOFOL 10 MG/ML IV BOLUS
INTRAVENOUS | Status: DC | PRN
  Administered 2018-02-22 (×2): 30 mg via INTRAVENOUS

## 2018-02-22 MED ORDER — LACTATED RINGERS IV SOLN
INTRAVENOUS | Status: DC
  Administered 2018-02-22: 11:00:00 via INTRAVENOUS

## 2018-02-22 MED ORDER — PROPOFOL 10 MG/ML IV BOLUS
INTRAVENOUS | Status: AC
  Filled 2018-02-22: qty 40

## 2018-02-22 NOTE — Anesthesia Postprocedure Evaluation (Signed)
Anesthesia Post Note  Patient: Dustin Walker  Procedure(s) Performed: COLONOSCOPY WITH PROPOFOL (N/A )  Patient location during evaluation: PACU Anesthesia Type: MAC Level of consciousness: awake and alert and oriented Pain management: pain level controlled Vital Signs Assessment: post-procedure vital signs reviewed and stable Respiratory status: spontaneous breathing Cardiovascular status: stable Postop Assessment: no apparent nausea or vomiting Anesthetic complications: no     Last Vitals:  Vitals:   02/22/18 1043 02/22/18 1108  BP:  105/67  Pulse:  60  Resp:  19  Temp: (P) 36.8 C 36.7 C  SpO2:  100%    Last Pain:  Vitals:   02/22/18 1108  TempSrc: Oral  PainSc:                  [name]

## 2018-02-22 NOTE — Discharge Instructions (Signed)
°  Colonoscopy Discharge Instructions  Read the instructions outlined below and refer to this sheet in the next few weeks. These discharge instructions provide you with general information on caring for yourself after you leave the hospital. Your doctor may also give you specific instructions. While your treatment has been planned according to the most current medical practices available, unavoidable complications occasionally occur. If you have any problems or questions after discharge, call Dr. Gala Romney at [phone]. ACTIVITY  You may resume your regular activity, but move at a slower pace for the next 24 hours.   Take frequent rest periods for the next 24 hours.   Walking will help get rid of the air and reduce the bloated feeling in your belly (abdomen).   No driving for 24 hours (because of the medicine (anesthesia) used during the test).    Do not sign any important legal documents or operate any machinery for 24 hours (because of the anesthesia used during the test).  NUTRITION  Drink plenty of fluids.   You may resume your normal diet as instructed by your doctor.   Begin with a light meal and progress to your normal diet. Heavy or fried foods are harder to digest and may make you feel sick to your stomach (nauseated).   Avoid alcoholic beverages for 24 hours or as instructed.  MEDICATIONS  You may resume your normal medications unless your doctor tells you otherwise.  WHAT YOU CAN EXPECT TODAY  Some feelings of bloating in the abdomen.   Passage of more gas than usual.   Spotting of blood in your stool or on the toilet paper.  IF YOU HAD POLYPS REMOVED DURING THE COLONOSCOPY:  No aspirin products for 7 days or as instructed.   No alcohol for 7 days or as instructed.   Eat a soft diet for the next 24 hours.  FINDING OUT THE RESULTS OF YOUR TEST Not all test results are available during your visit. If your test results are not back during the visit, make an appointment  with your caregiver to find out the results. Do not assume everything is normal if you have not heard from your caregiver or the medical facility. It is important for you to follow up on all of your test results.  SEEK IMMEDIATE MEDICAL ATTENTION IF:  You have more than a spotting of blood in your stool.   Your belly is swollen (abdominal distention).   You are nauseated or vomiting.   You have a temperature over 101.   You have abdominal pain or discomfort that is severe or gets worse throughout the day.   Repeat  colonoscopy in 7 years

## 2018-02-22 NOTE — Op Note (Signed)
[facility] Patient Name: Dustin Walker Procedure Date: 02/22/2018 11:06 AM MRN: [id number] Date of Birth: 17-Jul-1950 Attending MD: Norvel Richards , MD CSN: [id number] Age: 67 Admit Type: Outpatient Procedure:                Colonoscopy Indications:              High risk colon cancer surveillance: Personal                            history of colonic polyps Providers:                Norvel Richards, MD, Janeece Riggers, RN Referring MD:             Grace Bushy. Luking Medicines:                Propofol per Anesthesia Complications:            No immediate complications. Estimated Blood Loss:     Estimated blood loss: none. Procedure:                Pre-Anesthesia Assessment:                           - Prior to the procedure, a History and Physical                            was performed, and patient medications and                            allergies were reviewed. The patient's tolerance of                            previous anesthesia was also reviewed. The risks                            and benefits of the procedure and the sedation                            options and risks were discussed with the patient.                            All questions were answered, and informed consent                            was obtained. Prior Anticoagulants: The patient has                            taken no previous anticoagulant or antiplatelet                            agents. ASA Grade Assessment: II - A patient with                            mild systemic disease. After reviewing the risks  and benefits, the patient was deemed in                            satisfactory condition to undergo the procedure.                           After obtaining informed consent, the colonoscope                            was passed under direct vision. Throughout the                            procedure, the patient's blood pressure, pulse, and                   oxygen saturations were monitored continuously. The                            CF-[id number] ([id number]) scope was introduced through                            the and advanced to the the cecum, identified by                            appendiceal orifice and ileocecal valve. The                            colonoscopy was performed without difficulty. The                            patient tolerated the procedure well. The quality                            of the bowel preparation was adequate. Scope In: 11:31:03 AM Scope Out: 11:51:19 AM Scope Withdrawal Time: 0 hours 8 minutes 35 seconds  Total Procedure Duration: 0 hours 20 minutes 16 seconds  Findings:      The perianal and digital rectal examinations were normal.      The colon (entire examined portion) appeared normal. Colon was redundant.      The exam was otherwise without abnormality on direct and retroflexion       views. Impression:               - The entire examined colon is normal.                           - The examination was otherwise normal on direct                            and retroflexion views.                           - No specimens collected. Moderate Sedation:      Moderate (conscious) sedation was personally administered by an       anesthesia professional. The following parameters were monitored: oxygen       saturation, heart rate, blood  pressure, respiratory rate, EKG, adequacy       of pulmonary ventilation, and response to care. Recommendation:           - Patient has a contact number available for                            emergencies. The signs and symptoms of potential                            delayed complications were discussed with the                            patient. Return to normal activities tomorrow.                            Written discharge instructions were provided to the                            patient.                           - Resume previous diet.                            - Continue present medications.                           - Repeat colonoscopy in 7 years for surveillance.                           - Return to GI office PRN. Procedure Code(s):        --- Professional ---                           [phone], Colonoscopy, flexible; diagnostic, including                            collection of specimen(s) by brushing or washing,                            when performed (separate procedure) Diagnosis Code(s):        --- Professional ---                           Z86.010, Personal history of colonic polyps CPT copyright 2018 American Medical Association. All rights reserved. The codes documented in this report are preliminary and upon coder review may  be revised to meet current compliance requirements. Cristopher Estimable. Rourk, MD Norvel Richards, MD 02/22/2018 12:00:12 PM This report has been signed electronically. Number of Addenda: 0

## 2018-02-22 NOTE — Transfer of Care (Signed)
Immediate Anesthesia Transfer of Care Note  Patient: Dustin Walker  Procedure(s) Performed: COLONOSCOPY WITH PROPOFOL (N/A )  Patient Location: PACU  Anesthesia Type:MAC  Level of Consciousness: awake, alert , oriented and patient cooperative  Airway & Oxygen Therapy: Patient Spontanous Breathing  Post-op Assessment: Report given to RN and Post -op Vital signs reviewed and stable  Post vital signs: Reviewed and stable  Last Vitals:  Vitals Value Taken Time  BP 99/65 02/22/2018 11:58 AM  Temp    Pulse 61 02/22/2018 12:00 PM  Resp 20 02/22/2018 12:00 PM  SpO2 100 % 02/22/2018 12:00 PM  Vitals shown include unvalidated device data.  Last Pain:  Vitals:   02/22/18 1108  TempSrc: Oral  PainSc:       Patients Stated Pain Goal: 4 (81/85/63 1497)  Complications: No apparent anesthesia complications

## 2018-02-22 NOTE — Interval H&P Note (Signed)
History and Physical Interval Note:  02/22/2018 11:14 AM  Dustin Walker  has presented today for surgery, with the diagnosis of history colon polyps  The various methods of treatment have been discussed with the patient and family. After consideration of risks, benefits and other options for treatment, the patient has consented to  Procedure(s) with comments: COLONOSCOPY WITH PROPOFOL (N/A) - 12:00pm as a surgical intervention .  The patient's history has been reviewed, patient examined, no change in status, stable for surgery.  I have reviewed the patient's chart and labs.  Questions were answered to the patient's satisfaction.     Manus Rudd  Surveillance colonoscopy.no change.  The risks, benefits, limitations, alternatives and imponderables have been reviewed with the patient. Questions have been answered. All parties are agreeable.

## 2018-02-22 NOTE — Anesthesia Preprocedure Evaluation (Signed)
Anesthesia Evaluation  Patient identified by MRN, date of birth, ID band Patient awake    Reviewed: Allergy & Precautions, H&P , NPO status , Patient's Chart, lab work & pertinent test results, reviewed documented beta blocker date and time   Airway Mallampati: II  TM Distance: >3 FB Neck ROM: full    Dental  (+) Edentulous Lower, Edentulous Upper   Pulmonary COPD, Current Smoker,    Pulmonary exam normal breath sounds clear to auscultation       Cardiovascular Exercise Tolerance: Good negative cardio ROS   Rhythm:regular Rate:Normal     Neuro/Psych  Headaches, negative psych ROS   GI/Hepatic negative GI ROS, Neg liver ROS,   Endo/Other  negative endocrine ROS  Renal/GU Renal disease  negative genitourinary   Musculoskeletal   Abdominal   Peds  Hematology negative hematology ROS (+)   Anesthesia Other Findings   Reproductive/Obstetrics negative OB ROS                             Anesthesia Physical Anesthesia Plan  ASA: III  Anesthesia Plan: MAC   Post-op Pain Management:    Induction:   PONV Risk Score and Plan:   Airway Management Planned:   Additional Equipment:   Intra-op Plan:   Post-operative Plan:   Informed Consent: I have reviewed the patients History and Physical, chart, labs and discussed the procedure including the risks, benefits and alternatives for the proposed anesthesia with the patient or authorized representative who has indicated his/her understanding and acceptance.     Plan Discussed with: CRNA  Anesthesia Plan Comments:         Anesthesia Quick Evaluation

## 2018-02-22 NOTE — Interval H&P Note (Signed)
History and Physical Interval Note:  02/22/2018 11:06 AM  Dustin Walker  has presented today for surgery, with the diagnosis of history colon polyps  The various methods of treatment have been discussed with the patient and family. After consideration of risks, benefits and other options for treatment, the patient has consented to  Procedure(s) with comments: COLONOSCOPY WITH PROPOFOL (N/A) - 12:00pm as a surgical intervention .  The patient's history has been reviewed, patient examined, no change in status, stable for surgery.  I have reviewed the patient's chart and labs.  Questions were answered to the patient's satisfaction.     Dustin Walker  No change.  Surveillance colonoscopy per plan.  The risks, benefits, limitations, alternatives and imponderables have been reviewed with the patient. Questions have been answered. All parties are agreeable.

## 2018-02-22 NOTE — Anesthesia Procedure Notes (Addendum)
Procedure Name: MAC Date/Time: 02/22/2018 11:24 AM Performed by: Andree Elk Amy A, CRNA Pre-anesthesia Checklist: Patient identified, Emergency Drugs available, Suction available, Patient being monitored and Timeout performed Oxygen Delivery Method: Simple face mask

## 2018-02-23 ENCOUNTER — Encounter: Payer: Self-pay | Admitting: Family Medicine

## 2018-03-01 ENCOUNTER — Encounter (HOSPITAL_COMMUNITY): Payer: Self-pay | Admitting: Internal Medicine

## 2018-03-03 ENCOUNTER — Other Ambulatory Visit: Payer: Self-pay

## 2018-03-03 ENCOUNTER — Telehealth: Payer: Self-pay | Admitting: Family Medicine

## 2018-03-03 MED ORDER — NORTRIPTYLINE HCL 50 MG PO CAPS: 100.0000 mg | ORAL_CAPSULE | Freq: Every day | ORAL | 5 refills | Status: DC

## 2018-03-03 NOTE — Telephone Encounter (Signed)
Would like a refill on nortriptyline (PAMELOR) 50 MG capsule  for headaches    Sent to [facility]

## 2018-03-03 NOTE — Telephone Encounter (Signed)
Please advise. Thank you

## 2018-03-03 NOTE — Telephone Encounter (Signed)
Refills sent in and pt is aware °

## 2018-03-03 NOTE — Telephone Encounter (Signed)
Ok six mo worth 

## 2018-03-31 ENCOUNTER — Encounter: Payer: Self-pay | Admitting: Family Medicine

## 2018-03-31 ENCOUNTER — Ambulatory Visit (INDEPENDENT_AMBULATORY_CARE_PROVIDER_SITE_OTHER): Payer: 59 | Admitting: Family Medicine

## 2018-03-31 VITALS — BP 110/66 | Ht 68.0 in | Wt 131.0 lb

## 2018-03-31 DIAGNOSIS — Z23 Encounter for immunization: Secondary | ICD-10-CM | POA: Diagnosis not present

## 2018-03-31 DIAGNOSIS — R053 Chronic cough: Secondary | ICD-10-CM

## 2018-03-31 DIAGNOSIS — R059 Cough, unspecified: Secondary | ICD-10-CM

## 2018-03-31 DIAGNOSIS — R683 Clubbing of fingers: Secondary | ICD-10-CM

## 2018-03-31 DIAGNOSIS — R05 Cough: Secondary | ICD-10-CM

## 2018-03-31 DIAGNOSIS — J449 Chronic obstructive pulmonary disease, unspecified: Secondary | ICD-10-CM | POA: Diagnosis not present

## 2018-03-31 MED ORDER — OXYCODONE-ACETAMINOPHEN 7.5-325 MG PO TABS: 1.0000 | ORAL_TABLET | Freq: Three times a day (TID) | ORAL | 0 refills | Status: DC

## 2018-03-31 NOTE — Progress Notes (Signed)
   Subjective:    Patient ID: Dustin Walker, male    DOB: 05/23/1950, 67 y.o.   MRN: [id number]  HPI This patient was seen today for chronic pain. Takes for left hand pain.   The medication list was reviewed and updated.   -Compliance with medication: takes one tid  - Number patient states they take daily: one tid  -when was the last dose patient took? today  The patient was advised the importance of maintaining medication and not using illegal substances with these.  Here for refills and follow up  The patient was educated that we can provide 3 monthly scripts for their medication, it is their responsibility to follow the instructions.  Side effects or complications from medications: none  Patient is aware that pain medications are meant to minimize the severity of the pain to allow their pain levels to improve to allow for better function. They are aware of that pain medications cannot totally remove their pain.  Due for UDT ( at least once per year) : last one 12/28/17  Doing some walking    Chronic headaches. Ongoing.overall doing well, meds seem to be helping  Copd, ufortuntely ongoing, unfortunately patient continues to smoke.  Patient also notes chronic cough.  Generally worse in the morning.  He considers it basically as smoker's cough.  Does get some phlegm up but no hemoptysis.  No noticeable weight loss.  Reports a change in the appearance of his nails recently.  Asks my opinion on this   Wants flu vaccine today.        Review of Systems No headache, no major weight loss or weight gain, no chest pain no back pain abdominal pain no change in bowel habits complete ROS otherwise negative     Objective:   Physical Exam Alert no acute distress.  Vital stable some thin appearing lungs diffuse diminished breath sounds diffusely.  No tachypnea heart regular rate and rhythm.  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  With background severe COPD.  Noticeable to patient in recent months.  Notes a chronic cough but states it has been the same for a long time.  Generally just in the morning.  2.  Chronic pain.  Ongoing.  Substantial neuropathic features.  Discussed to maintain medications  This patient needs a scan of the chest.  Concerning with the clubbing of the fingers going on.  Occurring in a heavy smoker.  Occurring with chronic cough.  This certainly could represent lung cancer.  Discussed frankly with patient.  Will need a scan of the chest.  Rationale discussed  Greater than 50% of this 25 minute face to face visit was spent in counseling and discussion and coordination of care regarding the above diagnosis/diagnosies  Pain medications refilled

## 2018-04-02 ENCOUNTER — Telehealth: Payer: Self-pay | Admitting: Family Medicine

## 2018-04-02 NOTE — Telephone Encounter (Signed)
Need OV note 03/31/18 completed so I can work on the prior auth for pt's CT scan  Please advise

## 2018-04-04 NOTE — Telephone Encounter (Signed)
done

## 2018-04-05 ENCOUNTER — Ambulatory Visit (HOSPITAL_COMMUNITY)
Admission: RE | Admit: 2018-04-05 | Discharge: 2018-04-05 | Disposition: A | Discharge status: Home / Self Care | Payer: 59 | Source: Ambulatory Visit | Attending: Family Medicine | Admitting: Family Medicine

## 2018-04-05 ENCOUNTER — Telehealth: Payer: Self-pay | Admitting: Family Medicine

## 2018-04-05 ENCOUNTER — Other Ambulatory Visit: Payer: Self-pay | Admitting: *Deleted

## 2018-04-05 DIAGNOSIS — R05 Cough: Secondary | ICD-10-CM | POA: Insufficient documentation

## 2018-04-05 DIAGNOSIS — J449 Chronic obstructive pulmonary disease, unspecified: Secondary | ICD-10-CM | POA: Diagnosis not present

## 2018-04-05 DIAGNOSIS — R059 Cough, unspecified: Secondary | ICD-10-CM

## 2018-04-05 NOTE — Telephone Encounter (Signed)
Ok first lets do cherst xray today, call pt plz

## 2018-04-05 NOTE — Telephone Encounter (Signed)
[employer] states criteria not met for Chest CT with contrast & is requiring a physician-to-physician discussion  Please call [phone] & select option #3  (if not done withing 3 business days, notification request will expire)  See note in green folder in yellow box  Please advise

## 2018-04-05 NOTE — Telephone Encounter (Signed)
Discussed with pt. And order for xray put in. Pt states he will do today.

## 2018-04-13 ENCOUNTER — Telehealth: Payer: Self-pay | Admitting: Family Medicine

## 2018-04-13 DIAGNOSIS — N183 Chronic kidney disease, stage 3 unspecified: Secondary | ICD-10-CM

## 2018-04-13 NOTE — Addendum Note (Signed)
Addended by: Karle Barr on: 04/13/2018 02:09 PM   Modules accepted: Orders

## 2018-04-13 NOTE — Telephone Encounter (Signed)
Please advise. Thank you

## 2018-04-13 NOTE — Telephone Encounter (Signed)
Stat BUN/creatinine to be done before the CAT scan

## 2018-04-13 NOTE — Telephone Encounter (Signed)
Called & notified pt of appt for CT  Explained to pt that he'll need lab work & a nurse will call him when the orders are ready   Pt verbalized understanding

## 2018-04-13 NOTE — Telephone Encounter (Signed)
Need lab order - pt needs to have BUN & Creatinine done before CT scan - CT scan is for this Friday @ 7:45am @ [facility]  Please advise

## 2018-04-13 NOTE — Telephone Encounter (Signed)
Patient aware this is stat and it needed to be done prior to the scan. He will go to the [facility] to have this drawn on the day before the scan. He will come by the office for the order. Order up front awaiting pt to pick up.

## 2018-04-15 ENCOUNTER — Other Ambulatory Visit (HOSPITAL_COMMUNITY)
Admission: RE | Admit: 2018-04-15 | Discharge: 2018-04-15 | Disposition: A | Discharge status: Home / Self Care | Payer: 59 | Source: Ambulatory Visit | Attending: Family Medicine | Admitting: Family Medicine

## 2018-04-15 DIAGNOSIS — N183 Chronic kidney disease, stage 3 (moderate): Secondary | ICD-10-CM | POA: Diagnosis not present

## 2018-04-15 LAB — CREATININE, SERUM
Creatinine, Ser: 1.48 mg/dL — ABNORMAL HIGH (ref 0.61–1.24)
GFR calc non Af Amer: 49 mL/min — ABNORMAL LOW (ref >60)
GFR, EST AFRICAN AMERICAN: 57 mL/min — AB (ref >60)

## 2018-04-15 LAB — BUN: BUN: 20 mg/dL (ref 8–23)

## 2018-04-16 ENCOUNTER — Ambulatory Visit (HOSPITAL_COMMUNITY)
Admission: RE | Admit: 2018-04-16 | Discharge: 2018-04-16 | Disposition: A | Discharge status: Home / Self Care | Payer: 59 | Source: Ambulatory Visit | Attending: Family Medicine | Admitting: Family Medicine

## 2018-04-16 DIAGNOSIS — R05 Cough: Secondary | ICD-10-CM | POA: Diagnosis not present

## 2018-04-16 DIAGNOSIS — J439 Emphysema, unspecified: Secondary | ICD-10-CM | POA: Diagnosis not present

## 2018-04-16 DIAGNOSIS — J449 Chronic obstructive pulmonary disease, unspecified: Secondary | ICD-10-CM | POA: Diagnosis present

## 2018-04-16 DIAGNOSIS — R683 Clubbing of fingers: Secondary | ICD-10-CM | POA: Diagnosis not present

## 2018-04-16 MED ORDER — IOHEXOL 300 MG/ML  SOLN
60.0000 mL | Freq: Once | INTRAMUSCULAR | Status: AC | PRN
  Administered 2018-04-16: 60 mL via INTRAVENOUS

## 2018-06-21 ENCOUNTER — Other Ambulatory Visit: Payer: Self-pay | Admitting: Family Medicine

## 2018-06-22 NOTE — Telephone Encounter (Signed)
Six mo worth both

## 2018-06-30 ENCOUNTER — Ambulatory Visit (INDEPENDENT_AMBULATORY_CARE_PROVIDER_SITE_OTHER): Payer: Medicare HMO | Admitting: Family Medicine

## 2018-06-30 ENCOUNTER — Encounter: Payer: Self-pay | Admitting: Family Medicine

## 2018-06-30 VITALS — BP 90/70 | Ht 68.0 in | Wt 131.0 lb

## 2018-06-30 DIAGNOSIS — M792 Neuralgia and neuritis, unspecified: Secondary | ICD-10-CM | POA: Diagnosis not present

## 2018-06-30 DIAGNOSIS — N183 Chronic kidney disease, stage 3 unspecified: Secondary | ICD-10-CM

## 2018-06-30 DIAGNOSIS — Z13220 Encounter for screening for lipoid disorders: Secondary | ICD-10-CM | POA: Diagnosis not present

## 2018-06-30 DIAGNOSIS — G43009 Migraine without aura, not intractable, without status migrainosus: Secondary | ICD-10-CM

## 2018-06-30 DIAGNOSIS — J441 Chronic obstructive pulmonary disease with (acute) exacerbation: Secondary | ICD-10-CM

## 2018-06-30 DIAGNOSIS — Z79899 Other long term (current) drug therapy: Secondary | ICD-10-CM | POA: Diagnosis not present

## 2018-06-30 DIAGNOSIS — Z125 Encounter for screening for malignant neoplasm of prostate: Secondary | ICD-10-CM

## 2018-06-30 MED ORDER — ALBUTEROL SULFATE HFA 108 (90 BASE) MCG/ACT IN AERS: 2.0000 | INHALATION_SPRAY | Freq: Four times a day (QID) | RESPIRATORY_TRACT | 2 refills | Status: DC | PRN

## 2018-06-30 MED ORDER — OXYCODONE-ACETAMINOPHEN 7.5-325 MG PO TABS: ORAL_TABLET | ORAL | 0 refills | Status: DC

## 2018-06-30 MED ORDER — PANTOPRAZOLE SODIUM 40 MG PO TBEC: 40.0000 mg | DELAYED_RELEASE_TABLET | Freq: Every day | ORAL | 1 refills | Status: DC

## 2018-06-30 MED ORDER — OXYCODONE-ACETAMINOPHEN 7.5-325 MG PO TABS: 1.0000 | ORAL_TABLET | Freq: Three times a day (TID) | ORAL | 0 refills | Status: DC

## 2018-06-30 MED ORDER — TOPIRAMATE 25 MG PO TABS: ORAL_TABLET | ORAL | 1 refills | Status: DC

## 2018-06-30 NOTE — Progress Notes (Signed)
   Subjective:    Patient ID: Dustin Walker, male    DOB: [birth year], 68 y.o.   MRN: [id number]  HPI This patient was seen today for chronic pain  The medication list was reviewed and updated.   -Compliance with medication: Oxycodone 7.5-325 mg   - Number patient states they take daily: three per day  -when was the last dose patient took? This am.  The patient was advised the importance of maintaining medication and not using illegal substances with these.  Here for refills and follow up  The patient was educated that we can provide 3 monthly scripts for their medication, it is their responsibility to follow the instructions.  Side effects or complications from medications: Sleepiness  Patient is aware that pain medications are meant to minimize the severity of the pain to allow their pain levels to improve to allow for better function. They are aware of that pain medications cannot totally remove their pain.  Due for UDT ( at least once per year) : 12/29/2018  Needs Medication sent in with (March 17,April 17,May 17) Already picked up Feb 2020 rx.  Patient compliant with pain medication. Continues to experience the pain which led to initiation of analgesic intervention. No significant negative side effects. States definitely needs the pain medication to maintain current level of functioning. Does not receive controlled substance pain medication elsewhere.  Pain is manageable  Pt needs refill on hsi chroic meds   Patient has history of stage III chronic renal failure.  Current status uncertain.  Discussed will need to do blood work  Patient reports migraines overall stable.  While taking Topamax.  Handling well and definitely helping his situation.  Patient has known COPD.  Unfortunately continues to smoke.  Discussed and encouraged to quit           Good morning    There is like to use 6 months worth of chronic he had planned.       Review of Systems No  headache, no major weight loss or weight gain, no chest pain no back pain abdominal pain no change in bowel habits complete ROS otherwise negative     Objective:   Physical Exam Alert and oriented, vitals reviewed and stable, NAD ENT-TM's and ext canals WNL bilat via otoscopic exam Soft palate, tonsils and post pharynx WNL via oropharyngeal exam Neck-symmetric, no masses; thyroid nonpalpable and nontender Pulmonary-no tachypnea or accessory muscle use; diminished breath sounds diffusely Clear without wheezes via auscultation Card--no abnrml murmurs, rhythm reg and rate WNL Carotid pulses symmetric, without bruits 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  Migraine headaches clinically stable to maintain same  3.  Chronic renal failure status uncertain will check numbers  4.  COPD: Smoking cessation discussed.  Pain medicine prescribed.  Appropriate blood work  Impression: Chronic pain. Patient compliant with medication. No substantial side effects. [place] controlled substance registry reviewed to ensure compliance and proper use of medication. Patient aware goal of medicine is not complete resolution of pain but to control his symptoms to improve his functional capacity. Aware of potential adverse side effects Follow-up in 3 months for wellness plus chronic

## 2018-08-17 ENCOUNTER — Encounter: Payer: Self-pay | Admitting: Family Medicine

## 2018-08-17 ENCOUNTER — Other Ambulatory Visit: Payer: Self-pay | Admitting: Family Medicine

## 2018-09-16 ENCOUNTER — Encounter: Payer: Self-pay | Admitting: Family Medicine

## 2018-09-16 NOTE — Telephone Encounter (Signed)
Called pt and transferred him to the front to reschedule pain management

## 2018-09-28 ENCOUNTER — Encounter: Payer: Medicare HMO | Admitting: Family Medicine

## 2018-09-28 ENCOUNTER — Other Ambulatory Visit: Payer: Self-pay

## 2018-09-28 ENCOUNTER — Encounter: Payer: Self-pay | Admitting: Family Medicine

## 2018-09-28 ENCOUNTER — Ambulatory Visit (INDEPENDENT_AMBULATORY_CARE_PROVIDER_SITE_OTHER): Payer: Medicare HMO | Admitting: Family Medicine

## 2018-09-28 VITALS — BP 118/74 | Temp 97.6°F | Wt 133.6 lb

## 2018-09-28 DIAGNOSIS — J449 Chronic obstructive pulmonary disease, unspecified: Secondary | ICD-10-CM

## 2018-09-28 DIAGNOSIS — G43009 Migraine without aura, not intractable, without status migrainosus: Secondary | ICD-10-CM | POA: Diagnosis not present

## 2018-09-28 DIAGNOSIS — R05 Cough: Secondary | ICD-10-CM

## 2018-09-28 DIAGNOSIS — N183 Chronic kidney disease, stage 3 unspecified: Secondary | ICD-10-CM

## 2018-09-28 DIAGNOSIS — M792 Neuralgia and neuritis, unspecified: Secondary | ICD-10-CM | POA: Diagnosis not present

## 2018-09-28 DIAGNOSIS — R053 Chronic cough: Secondary | ICD-10-CM

## 2018-09-28 MED ORDER — TOPIRAMATE 25 MG PO TABS: ORAL_TABLET | ORAL | 1 refills | Status: DC

## 2018-09-28 MED ORDER — OXYCODONE-ACETAMINOPHEN 7.5-325 MG PO TABS: 1.0000 | ORAL_TABLET | Freq: Three times a day (TID) | ORAL | 0 refills | Status: DC

## 2018-09-28 MED ORDER — IBUPROFEN 800 MG PO TABS: 800.0000 mg | ORAL_TABLET | Freq: Two times a day (BID) | ORAL | 1 refills | Status: DC | PRN

## 2018-09-28 MED ORDER — OXYCODONE-ACETAMINOPHEN 7.5-325 MG PO TABS: ORAL_TABLET | ORAL | 0 refills | Status: DC

## 2018-09-28 MED ORDER — IBUPROFEN 800 MG PO TABS: ORAL_TABLET | ORAL | 3 refills | Status: DC

## 2018-09-28 MED ORDER — PANTOPRAZOLE SODIUM 40 MG PO TBEC: 40.0000 mg | DELAYED_RELEASE_TABLET | Freq: Every day | ORAL | 1 refills | Status: DC

## 2018-09-28 NOTE — Progress Notes (Signed)
   Subjective:    Patient ID: Dustin Walker, male    DOB: June 13, 1950, 68 y.o.   MRN: [id number] In person HPI This patient was seen today for chronic pain  The medication list was reviewed and updated.   -Compliance with medication: takes one tid  - Number patient states they take daily: 3  -when was the last dose patient took? today  The patient was advised the importance of maintaining medication and not using illegal substances with these.  Here for refills and follow up  The patient was educated that we can provide 3 monthly scripts for their medication, it is their responsibility to follow the instructions.  Side effects or complications from medications: none  Patient is aware that pain medications are meant to minimize the severity of the pain to allow their pain levels to improve to allow for better function. They are aware of that pain medications cannot totally remove their pain.  Due for UDT ( at least once per year) : last one 12/28/17  Pt states no concerns today.   Patient also notes chronic headaches continue to improve.  Handling the Topamax well.  Definitely helping symptoms.  Also nightly nortriptyline continues to help neuropathy   COPD is ongoing.  Occasional use of inhaler.  Patient aware of risk factors with COVID-19.  States shortness of breath is not worsened  Review of Systems No headache, no major weight loss or weight gain, no chest pain no back pain abdominal pain no change in bowel habits complete ROS otherwise negative     Objective:   Physical Exam  Alert and oriented, vitals reviewed and stable, NAD ENT-TM's and ext canals WNL bilat via otoscopic exam Soft palate, tonsils and post pharynx WNL via oropharyngeal exam Neck-symmetric, no masses; thyroid nonpalpable and nontender Pulmonary-no tachypnea or accessory muscle use; Clear without wheezes via auscultation Card--no abnrml murmurs, rhythm reg and rate WNL Carotid pulses symmetric,  without bruits Left hand diminished sensation.  Positive deformity      Assessment & Plan:  Impression 1 chronic neuropathic pain.  Compliant with meds  Impression: Chronic pain. Patient compliant with medication. No substantial side effects. [place] controlled substance registry reviewed to ensure compliance and proper use of medication. Patient aware goal of medicine is not complete resolution of pain but to control his symptoms to improve his functional capacity. Aware of potential adverse side effects    2.  COPD.  Clinically stable.  Symptom management discussed  3.  Chronic headaches.  Clinically stable on meds patient maintain compliance discussed  Greater than 50% of this 25 minute face to face visit was spent in counseling and discussion and coordination of care regarding the above diagnosis/diagnosies

## 2018-11-03 ENCOUNTER — Encounter: Payer: Self-pay | Admitting: Family Medicine

## 2018-11-04 DIAGNOSIS — Z125 Encounter for screening for malignant neoplasm of prostate: Secondary | ICD-10-CM | POA: Diagnosis not present

## 2018-11-04 DIAGNOSIS — Z13220 Encounter for screening for lipoid disorders: Secondary | ICD-10-CM | POA: Diagnosis not present

## 2018-11-04 DIAGNOSIS — Z79899 Other long term (current) drug therapy: Secondary | ICD-10-CM | POA: Diagnosis not present

## 2018-11-04 DIAGNOSIS — N183 Chronic kidney disease, stage 3 (moderate): Secondary | ICD-10-CM | POA: Diagnosis not present

## 2018-11-05 LAB — BASIC METABOLIC PANEL
BUN / CREAT RATIO: 12 (ref 10–24)
BUN: 18 mg/dL (ref 8–27)
CALCIUM: 9.2 mg/dL (ref 8.6–10.2)
CHLORIDE: 104 mmol/L (ref 96–106)
CO2: 18 mmol/L — AB (ref 20–29)
Creatinine, Ser: 1.5 mg/dL — ABNORMAL HIGH (ref 0.76–1.27)
GFR calc Af Amer: 55 mL/min/{1.73_m2} — ABNORMAL LOW (ref >59)
GFR calc non Af Amer: 48 mL/min/{1.73_m2} — ABNORMAL LOW (ref >59)
Glucose: 102 mg/dL — ABNORMAL HIGH (ref 65–99)
Potassium: 4.3 mmol/L (ref 3.5–5.2)
Sodium: 138 mmol/L (ref 134–144)

## 2018-11-05 LAB — HEPATIC FUNCTION PANEL
ALT: 13 IU/L (ref 0–44)
AST: 15 IU/L (ref 0–40)
Albumin: 4.4 g/dL (ref 3.8–4.8)
Alkaline Phosphatase: 86 IU/L (ref 39–117)
BILIRUBIN TOTAL: 0.3 mg/dL (ref 0.0–1.2)
BILIRUBIN, DIRECT: 0.11 mg/dL (ref 0.00–0.40)
TOTAL PROTEIN: 6.9 g/dL (ref 6.0–8.5)

## 2018-11-05 LAB — PSA: Prostate Specific Ag, Serum: 0.6 ng/mL (ref 0.0–4.0)

## 2018-11-05 LAB — LIPID PANEL
CHOL/HDL RATIO: 3.7 ratio (ref 0.0–5.0)
CHOLESTEROL TOTAL: 145 mg/dL (ref 100–199)
HDL: 39 mg/dL — AB (ref >39)
LDL Calculated: 86 mg/dL (ref 0–99)
TRIGLYCERIDES: 100 mg/dL (ref 0–149)
VLDL Cholesterol Cal: 20 mg/dL (ref 5–40)

## 2018-11-11 ENCOUNTER — Other Ambulatory Visit: Payer: Self-pay | Admitting: *Deleted

## 2018-11-11 DIAGNOSIS — R911 Solitary pulmonary nodule: Secondary | ICD-10-CM

## 2018-11-11 DIAGNOSIS — R918 Other nonspecific abnormal finding of lung field: Secondary | ICD-10-CM

## 2018-11-16 ENCOUNTER — Encounter: Payer: Self-pay | Admitting: Family Medicine

## 2018-11-22 ENCOUNTER — Encounter: Payer: Self-pay | Admitting: Family Medicine

## 2018-12-02 ENCOUNTER — Other Ambulatory Visit: Payer: Self-pay

## 2018-12-02 ENCOUNTER — Ambulatory Visit (HOSPITAL_COMMUNITY)
Admission: RE | Admit: 2018-12-02 | Discharge: 2018-12-02 | Disposition: A | Discharge status: Home / Self Care | Payer: Medicare HMO | Source: Ambulatory Visit | Attending: Family Medicine | Admitting: Family Medicine

## 2018-12-02 DIAGNOSIS — R918 Other nonspecific abnormal finding of lung field: Secondary | ICD-10-CM | POA: Diagnosis not present

## 2018-12-02 MED ORDER — IOHEXOL 300 MG/ML  SOLN
75.0000 mL | Freq: Once | INTRAMUSCULAR | Status: AC | PRN
  Administered 2018-12-02: 15:00:00 75 mL via INTRAVENOUS

## 2018-12-21 ENCOUNTER — Other Ambulatory Visit: Payer: Self-pay | Admitting: Family Medicine

## 2018-12-22 NOTE — Telephone Encounter (Signed)
Ok one yr

## 2018-12-24 ENCOUNTER — Telehealth: Payer: Self-pay | Admitting: Family Medicine

## 2018-12-24 MED ORDER — IBUPROFEN 800 MG PO TABS: 800.0000 mg | ORAL_TABLET | Freq: Two times a day (BID) | ORAL | 1 refills | Status: DC | PRN

## 2018-12-24 NOTE — Telephone Encounter (Signed)
Contacted patient. Pt states that he was told he was going through his medication to fast. Pt states that he is not going through meds to fast. Pt states he takes his meds as directed. Pt states he get a 90 day supply of all med except pain pills and nortriptyline. Pt states he is needing refills on Ibuprofen 800 mg, Topimate 25 mg and Protonix 40 mg. Topimate and protonix was sent in on 12/22/2018 for a one year supply. Pt states he is needing refill on Ibu 800 mg. Pt states he takes one tablet twice a day. Please advise. Thank you  [facility].

## 2018-12-24 NOTE — Telephone Encounter (Signed)
This message is a bit confusing so please ref at the appropriate amnt as he reports taking, for the appropriate duration, with one yrs worth of ref on all

## 2018-12-24 NOTE — Telephone Encounter (Signed)
Pt was just needing refill on Ibu 800. All other meds had refills. Refill for one year sent to [facility]. Pt is aware

## 2018-12-24 NOTE — Telephone Encounter (Signed)
Pt would like nurse to call him in regards to medication. He states it was messed up last time and would like to talk to a nurse before refill is given

## 2018-12-28 ENCOUNTER — Ambulatory Visit (INDEPENDENT_AMBULATORY_CARE_PROVIDER_SITE_OTHER): Payer: Medicare HMO | Admitting: Family Medicine

## 2018-12-28 ENCOUNTER — Other Ambulatory Visit: Payer: Self-pay

## 2018-12-28 ENCOUNTER — Encounter: Payer: Self-pay | Admitting: Family Medicine

## 2018-12-28 DIAGNOSIS — M792 Neuralgia and neuritis, unspecified: Secondary | ICD-10-CM | POA: Diagnosis not present

## 2018-12-28 NOTE — Progress Notes (Signed)
   Subjective:  Audio plus video only  Patient ID: Dustin Walker, male    DOB: 09-Feb-1951, 68 y.o.   MRN: [id number]  HPI  This patient was seen today for chronic pain  The medication list was reviewed and updated.   -Compliance with medication: yes  - Number patient states they take daily: 3  -when was the last dose patient took? today  The patient was advised the importance of maintaining medication and not using illegal substances with these.  Here for refills and follow up  The patient was educated that we can provide 3 monthly scripts for their medication, it is their responsibility to follow the instructions.  Side effects or complications from medications: none  Patient is aware that pain medications are meant to minimize the severity of the pain to allow their pain levels to improve to allow for better function. They are aware of that pain medications cannot totally remove their pain.  Next script not due till 01/27/2019 per patient   Virtual Visit via Video Note  I connected with [name] on 12/28/18 at 10:00 AM EDT by a video enabled telemedicine application and verified that I am speaking with the correct person using two identifiers.  Location: Patient: home Provider: office   I discussed the limitations of evaluation and management by telemedicine and the availability of in person appointments. The patient expressed understanding and agreed to proceed.  History of Present Illness:    Observations/Objective:   Assessment and Plan:   Follow Up Instructions:    I discussed the assessment and treatment plan with the patient. The patient was provided an opportunity to ask questions and all were answered. The patient agreed with the plan and demonstrated an understanding of the instructions.   The patient was advised to call back or seek an in-person evaluation if the symptoms worsen or if the condition fails to improve as anticipated.  I  provided 18 minutes of non-face-to-face time during this encounter.  Patient compliant with pain medication. Continues to experience the pain which led to initiation of analgesic intervention. No significant negative side effects. States definitely needs the pain medication to maintain current level of functioning. Does not receive controlled substance pain medication elsewhere.       Review of Systems No headache, no major weight loss or weight gain, no chest pain no back pain abdominal pain no change in bowel habits complete ROS otherwise negative     Objective:   Physical Exam   Virtual     Assessment & Plan:  Impression: Chronic pain. Patient compliant with medication. No substantial side effects. [place] controlled substance registry reviewed to ensure compliance and proper use of medication. Patient aware goal of medicine is not complete resolution of pain but to control his symptoms to improve his functional capacity. Aware of potential adverse side effects

## 2018-12-29 MED ORDER — OXYCODONE-ACETAMINOPHEN 7.5-325 MG PO TABS: ORAL_TABLET | ORAL | 0 refills | Status: DC

## 2018-12-29 MED ORDER — OXYCODONE-ACETAMINOPHEN 7.5-325 MG PO TABS: 1.0000 | ORAL_TABLET | Freq: Three times a day (TID) | ORAL | 0 refills | Status: DC

## 2019-01-01 ENCOUNTER — Encounter: Payer: Self-pay | Admitting: Family Medicine

## 2019-02-04 ENCOUNTER — Encounter: Payer: Self-pay | Admitting: Family Medicine

## 2019-03-12 ENCOUNTER — Other Ambulatory Visit: Payer: Self-pay | Admitting: Family Medicine

## 2019-03-14 ENCOUNTER — Telehealth: Payer: Self-pay | Admitting: Family Medicine

## 2019-03-14 MED ORDER — NORTRIPTYLINE HCL 50 MG PO CAPS: 100.0000 mg | ORAL_CAPSULE | Freq: Every day | ORAL | 5 refills | Status: DC

## 2019-03-14 NOTE — Telephone Encounter (Signed)
Please advise. Thank you

## 2019-03-14 NOTE — Telephone Encounter (Signed)
Medication sent in. Left message to return call 

## 2019-03-14 NOTE — Telephone Encounter (Signed)
Ok six mo worth 

## 2019-03-14 NOTE — Telephone Encounter (Signed)
Patient is requesting refill on nortriptyline 50 mg to be called [facility] he states completely out

## 2019-03-17 NOTE — Telephone Encounter (Signed)
Pt.notified

## 2019-03-31 ENCOUNTER — Encounter: Payer: Self-pay | Admitting: Family Medicine

## 2019-03-31 ENCOUNTER — Other Ambulatory Visit: Payer: Self-pay

## 2019-03-31 ENCOUNTER — Ambulatory Visit (INDEPENDENT_AMBULATORY_CARE_PROVIDER_SITE_OTHER): Payer: Medicare HMO | Admitting: Family Medicine

## 2019-03-31 DIAGNOSIS — M792 Neuralgia and neuritis, unspecified: Secondary | ICD-10-CM | POA: Diagnosis not present

## 2019-03-31 MED ORDER — OXYCODONE-ACETAMINOPHEN 7.5-325 MG PO TABS: ORAL_TABLET | ORAL | 0 refills | Status: DC

## 2019-03-31 NOTE — Progress Notes (Signed)
   Subjective:  Audio plus video  Patient ID: Dustin Walker, male    DOB: 03-Sep-1950, 68 y.o.   MRN: [id number]  HPI This patient was seen today for chronic pain  The medication list was reviewed and updated.   -Compliance with medication: Oxycodone 7.5-325 mg  - Number patient states they take daily: 3  -when was the last dose patient took? This morning  The patient was advised the importance of maintaining medication and not using illegal substances with these.  Here for refills and follow up  The patient was educated that we can provide 3 monthly scripts for their medication, it is their responsibility to follow the instructions.  Side effects or complications from medications: none  Patient is aware that pain medications are meant to minimize the severity of the pain to allow their pain levels to improve to allow for better function. They are aware of that pain medications cannot totally remove their pain.  Due for UDT ( at least once per year) : last one completed 12/28/17  Virtual Visit via Telephone Note  I connected with Dustin Walker on 03/31/19 at 10:00 AM EST by telephone and verified that I am speaking with the correct person using two identifiers.  Location: Patient: home Provider: office   I discussed the limitations, risks, security and privacy concerns of performing an evaluation and management service by telephone and the availability of in person appointments. I also discussed with the patient that there may be a patient responsible charge related to this service. The patient expressed understanding and agreed to proceed.   History of Present Illness:    Observations/Objective:   Assessment and Plan:   Follow Up Instructions:    I discussed the assessment and treatment plan with the patient. The patient was provided an opportunity to ask questions and all were answered. The patient agreed with the plan and demonstrated an understanding of the  instructions.   The patient was advised to call back or seek an in-person evaluation if the symptoms worsen or if the condition fails to improve as anticipated. 18 minutes of non-face-to-face time during this encounter.   Dustin Males, LPN    Patient notes progressive intense pain definitely worse than before.  Radiates from the hand into the wrist.  Some days needs more than 3 pain tablets to adequately cover pain.  Claims compliance with medications   Review of Systems No headache, no major weight loss or weight gain, no chest pain no back pain abdominal pain no change in bowel habits complete ROS otherwise negative     Objective:   Physical Exam  Virtual      Assessment & Plan:  Impression: Chronic pain. Patient compliant with medication. No substantial side effects. [place] controlled substance registry reviewed to ensure compliance and proper use of medication. Patient aware goal of medicine is not complete resolution of pain but to control his symptoms to improve his functional capacity. Aware of potential adverse side effects

## 2019-04-26 ENCOUNTER — Other Ambulatory Visit: Payer: Self-pay | Admitting: Family Medicine

## 2019-05-18 ENCOUNTER — Encounter: Payer: Self-pay | Admitting: Family Medicine

## 2019-06-16 ENCOUNTER — Encounter: Payer: Self-pay | Admitting: Family Medicine

## 2019-07-11 ENCOUNTER — Encounter: Payer: Self-pay | Admitting: Family Medicine

## 2019-07-11 MED ORDER — AMOXICILLIN-POT CLAVULANATE 875-125 MG PO TABS: 1.0000 | ORAL_TABLET | Freq: Two times a day (BID) | ORAL | 0 refills | Status: AC

## 2019-07-11 NOTE — Telephone Encounter (Signed)
Mikey Kirschner, MD     Aug 875 bid ten d , f u office visit or virt visit if persists

## 2019-07-11 NOTE — Telephone Encounter (Signed)
Patient states he has been getting sinus headaches off and on for 6 days- patient states it is not a migraine it is sinus headache- he is taking BC powders for them.. Patient states he has no other symptoms, no congestion no fever, no cough.

## 2019-07-14 ENCOUNTER — Ambulatory Visit: Payer: 59 | Attending: Internal Medicine

## 2019-07-14 DIAGNOSIS — Z23 Encounter for immunization: Secondary | ICD-10-CM | POA: Insufficient documentation

## 2019-07-14 NOTE — Progress Notes (Signed)
   Covid-19 Vaccination Clinic  Name:  Dustin Walker    MRN: [id number] DOB: 1950-11-05  07/14/2019  [name] was observed post Covid-19 immunization for 15 minutes without incident. He was provided with Vaccine Information Sheet and instruction to access the V-Safe system.   [name] was instructed to call 911 with any severe reactions post vaccine: Marland Kitchen Difficulty breathing  . Swelling of face and throat  . A fast heartbeat  . A bad rash all over body  . Dizziness and weakness

## 2019-07-27 ENCOUNTER — Other Ambulatory Visit: Payer: Self-pay

## 2019-07-27 ENCOUNTER — Ambulatory Visit (INDEPENDENT_AMBULATORY_CARE_PROVIDER_SITE_OTHER): Payer: Medicare HMO | Admitting: Family Medicine

## 2019-07-27 DIAGNOSIS — G8929 Other chronic pain: Secondary | ICD-10-CM | POA: Diagnosis not present

## 2019-07-27 DIAGNOSIS — M792 Neuralgia and neuritis, unspecified: Secondary | ICD-10-CM

## 2019-07-27 MED ORDER — PANTOPRAZOLE SODIUM 40 MG PO TBEC: 40.0000 mg | DELAYED_RELEASE_TABLET | Freq: Every day | ORAL | 1 refills | Status: DC

## 2019-07-27 MED ORDER — NORTRIPTYLINE HCL 50 MG PO CAPS: 100.0000 mg | ORAL_CAPSULE | Freq: Every day | ORAL | 5 refills | Status: DC

## 2019-07-27 MED ORDER — TOPIRAMATE 25 MG PO TABS: 50.0000 mg | ORAL_TABLET | Freq: Two times a day (BID) | ORAL | 1 refills | Status: DC

## 2019-07-27 NOTE — Progress Notes (Signed)
   Subjective:  Audio plus video  Patient ID: Dustin Walker, male    DOB: June 24, 1950, 69 y.o.   MRN: [id number]  HPI  This patient was seen today for chronic pain  The medication list was reviewed and updated.   -Compliance with medication: yes  - Number patient states they take daily: 4 a day  -when was the last dose patient took? today  The patient was advised the importance of maintaining medication and not using illegal substances with these.  Here for refills and follow up  The patient was educated that we can provide 3 monthly scripts for their medication, it is their responsibility to follow the instructions.  Side effects or complications from medications: none   Patient is aware that pain medications are meant to minimize the severity of the pain to allow their pain levels to improve to allow for better function. They are aware of that pain medications cannot totally remove their pain.         Review of Systems Virtual Visit via Video Note  I connected with Dustin Walker on 07/27/19 at  2:00 PM EDT by a video enabled telemedicine application and verified that I am speaking with the correct person using two identifiers.  Location: Patient: home Provider: office   I discussed the limitations of evaluation and management by telemedicine and the availability of in person appointments. The patient expressed understanding and agreed to proceed.  History of Present Illness:    Observations/Objective:   Assessment and Plan:   Follow Up Instructions:    I discussed the assessment and treatment plan with the patient. The patient was provided an opportunity to ask questions and all were answered. The patient agreed with the plan and demonstrated an understanding of the instructions.   The patient was advised to call back or seek an in-person evaluation if the symptoms worsen or if the condition fails to improve as anticipated.  I provided 22 minutes of  non-face-to-face time during this encounter.        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  Compliant with medication.  States definitely helps him.  Impression: Chronic pain. Patient compliant with medication. No substantial side effects. [place] controlled substance registry reviewed to ensure compliance and proper use of medication. Patient aware goal of medicine is not complete resolution of pain but to control his symptoms to improve his functional capacity. Aware of potential adverse side effects  3 months medication management follow-up

## 2019-07-29 MED ORDER — OXYCODONE-ACETAMINOPHEN 7.5-325 MG PO TABS: ORAL_TABLET | ORAL | 0 refills | Status: DC

## 2019-08-09 ENCOUNTER — Ambulatory Visit: Payer: 59 | Attending: Internal Medicine

## 2019-08-09 DIAGNOSIS — Z23 Encounter for immunization: Secondary | ICD-10-CM

## 2019-08-09 NOTE — Progress Notes (Signed)
   Covid-19 Vaccination Clinic  Name:  DEVERON LONGNECKER    MRN: [id number] DOB: 1950/12/16  08/09/2019  Mr. Aquilar was observed post Covid-19 immunization for 15 minutes without incident. He was provided with Vaccine Information Sheet and instruction to access the V-Safe system.   Mr. Guzzardi was instructed to call 911 with any severe reactions post vaccine: Marland Kitchen Difficulty breathing  . Swelling of face and throat  . A fast heartbeat  . A bad rash all over body  . Dizziness and weakness   Immunizations Administered    Name Date Dose VIS Date Route   Pfizer COVID-19 Vaccine 08/09/2019  2:54 PM 0.3 mL 04/22/2019 Intramuscular   Manufacturer: [place]   Lot: [id number]   [place]: [id number]

## 2019-09-23 ENCOUNTER — Encounter: Payer: Self-pay | Admitting: Family Medicine

## 2019-09-23 NOTE — Telephone Encounter (Signed)
Mikey Kirschner, MD      At an age where it could be other more serious causes, rec urgicare or er visit

## 2019-10-18 DIAGNOSIS — Z0100 Encounter for examination of eyes and vision without abnormal findings: Secondary | ICD-10-CM | POA: Diagnosis not present

## 2019-10-18 DIAGNOSIS — H524 Presbyopia: Secondary | ICD-10-CM | POA: Diagnosis not present

## 2019-10-18 DIAGNOSIS — H2513 Age-related nuclear cataract, bilateral: Secondary | ICD-10-CM | POA: Diagnosis not present

## 2019-11-10 ENCOUNTER — Ambulatory Visit (INDEPENDENT_AMBULATORY_CARE_PROVIDER_SITE_OTHER): Payer: Medicare HMO | Admitting: Family Medicine

## 2019-11-10 ENCOUNTER — Other Ambulatory Visit: Payer: Self-pay

## 2019-11-10 ENCOUNTER — Encounter: Payer: Self-pay | Admitting: Family Medicine

## 2019-11-10 VITALS — BP 130/84 | HR 71 | Temp 97.8°F | Ht 68.0 in | Wt 125.8 lb

## 2019-11-10 DIAGNOSIS — Z79891 Long term (current) use of opiate analgesic: Secondary | ICD-10-CM | POA: Diagnosis not present

## 2019-11-10 DIAGNOSIS — Z1283 Encounter for screening for malignant neoplasm of skin: Secondary | ICD-10-CM | POA: Diagnosis not present

## 2019-11-10 NOTE — Progress Notes (Signed)
Patient ID: Dustin Walker, male    DOB: 1950-05-24, 69 y.o.   MRN: [id number]   Chief Complaint  Patient presents with  . Pain Management   Subjective:    HPI Pt seen to f/u on chronic pain medication.  Pt had left hand crushing injury of left hand/fingers, wrist. Went through compression roller.  Fracture to fingers and and kuncles and fx to hand.  fx to wrist and in fx.  Accident happened in 2013. Had surgeries on the hand/forearm. Saw pain management doctor.    This patient was seen today for chronic pain  The medication list was reviewed and updated.   -Compliance with medication: Oxycodone 7.5-325  - Number patient states they take daily: 3-4; depends on pain level  -when was the last dose patient took? This morning  The patient was advised the importance of maintaining medication and not using illegal substances with these.  Here for refills and follow up  The patient was educated that we can provide 3 monthly scripts for their medication, it is their responsibility to follow the instructions.  Side effects or complications from medications: none  Patient is aware that pain medications are meant to minimize the severity of the pain to allow their pain levels to improve to allow for better function. They are aware of that pain medications cannot totally remove their pain.  Due for UDT ( at least once per year) : none, will order today.  Other concerns-  Pt would also like referral to dermatology for skin cancer on back.    Medical History Dustin Walker has a past medical history of Allergic rhinitis, Arthritis, Chronic pain, and COPD (chronic obstructive pulmonary disease) ([place]).   Outpatient Encounter Medications as of 11/10/2019  Medication Sig  . albuterol (PROVENTIL HFA;VENTOLIN HFA) 108 (90 Base) MCG/ACT inhaler Inhale 2 puffs into the lungs every 6 (six) hours as needed for wheezing or shortness of breath.  Marland Kitchen ibuprofen (ADVIL) 800 MG tablet TAKE 1 TABLET BY  MOUTH  TWICE DAILY AS NEEDED  . nortriptyline (PAMELOR) 50 MG capsule Take 2 capsules (100 mg total) by mouth at bedtime.  Marland Kitchen omeprazole (PRILOSEC) 10 MG capsule Take 20 mg by mouth daily. Otc.  Marland Kitchen oxyCODONE-acetaminophen (PERCOCET) 7.5-325 MG tablet Take one tablet po up to QID prn; must last one month  . oxyCODONE-acetaminophen (PERCOCET) 7.5-325 MG tablet Take one tablet po up to QID prn; must last one month  . oxyCODONE-acetaminophen (PERCOCET) 7.5-325 MG tablet Take one tablet po up to QID prn; must last one month  . pantoprazole (PROTONIX) 40 MG tablet Take 1 tablet (40 mg total) by mouth daily.  Marland Kitchen topiramate (TOPAMAX) 25 MG tablet Take 2 tablets (50 mg total) by mouth 2 (two) times daily.   No facility-administered encounter medications on file as of 11/10/2019.     Review of Systems  Constitutional: Negative for chills and fever.  Musculoskeletal: Negative for arthralgias, back pain and joint swelling.       + chronic left hand/wrist pain.  Skin: Negative for rash.     Vitals BP 130/84   Pulse 71   Temp 97.8 F (36.6 C)   Ht 5\' 8"  (1.727 m)   Wt 125 lb 12.8 oz (57.1 kg)   SpO2 99%   BMI 19.13 kg/m   Objective:   Physical Exam Vitals and nursing note reviewed.  Constitutional:      General: He is not in acute distress. Pulmonary:     Effort: Pulmonary effort  is normal. No respiratory distress.  Skin:    General: Skin is warm and dry.     Findings: No bruising, erythema, lesion or rash.     Comments: +surgical scar on left inner wrist.  Normal rom of wrist, hand and fingers.  Normal cap refill.  Normal pulse.  +stimulator in left ribcage for his nerve pain.  Neurological:     General: No focal deficit present.     Mental Status: He is alert.     Cranial Nerves: No cranial nerve deficit.     Motor: No weakness.  Psychiatric:        Mood and Affect: Mood normal.        Behavior: Behavior normal.      Assessment and Plan   1. Admission for long-term opiate  use - ToxASSURE Select 13 (MW), Urine  2. Encounter for screening for malignant neoplasm of skin - Ambulatory referral to Dermatology - Ambulatory referral to Dermatology  3. Encounter for long-term opiate analgesic use   Pt willing to try to taper down, ultimately the goal would be to get to 5mg  hydrocodone over time. Has a stimulator, Dr. Lynnette Caffey in [place].  Pain management in past.   Pt reluctantly willing to try to taper down, "doesn't think it will work."   Pt also stating he might, just "cut his hand off."   Discussed pt has multiple options for either referral to ortho, pain management, or stay with [place] with a tapering plan or alternative medications for pain.  Pt stating has 17 tablets at this time of oxycodone 7.5mg .  After urine drug screen returns will consider oxycodone 5mg  tab tid. Then taper down 10-20% each month and goal to change over to hydrocodone. May need to add gabapentin.  Pt has h/o ckd stage 3, and unable to take nsaids.  Pt in agreement.   F/u 1 month.

## 2019-11-11 LAB — MED LIST ATTACHED SEPARATELY

## 2019-11-17 LAB — SPECIMEN STATUS REPORT

## 2019-11-17 LAB — TOXASSURE SELECT 13 (MW), URINE

## 2019-11-21 ENCOUNTER — Encounter: Payer: Self-pay | Admitting: Family Medicine

## 2019-11-29 ENCOUNTER — Telehealth: Payer: Self-pay | Admitting: *Deleted

## 2019-11-29 MED ORDER — NORTRIPTYLINE HCL 50 MG PO CAPS: 100.0000 mg | ORAL_CAPSULE | Freq: Every day | ORAL | 5 refills | Status: DC

## 2019-11-29 NOTE — Telephone Encounter (Signed)
Refill request from [facility] for Pamelor.

## 2019-11-29 NOTE — Telephone Encounter (Signed)
Patient states he doesn't really know what he is supposed to be doing. He hasn't been pain management and I didn't see a referral either. He said he would be willing to see pain management if they were local. He also states he has cut back his oxycodone to 2-3 tablets per day as needed.

## 2019-11-29 NOTE — Telephone Encounter (Signed)
Pls give referral to pain management.  Also if pt unable to see them, due to location, then pls give him info for [facility]. Thx. Dr. Lovena Le

## 2019-11-30 ENCOUNTER — Other Ambulatory Visit: Payer: Self-pay | Admitting: *Deleted

## 2019-11-30 DIAGNOSIS — Z79891 Long term (current) use of opiate analgesic: Secondary | ICD-10-CM

## 2019-11-30 DIAGNOSIS — M792 Neuralgia and neuritis, unspecified: Secondary | ICD-10-CM

## 2019-11-30 NOTE — Telephone Encounter (Signed)
Referral in epic for [facility].

## 2019-12-01 ENCOUNTER — Telehealth: Payer: Self-pay | Admitting: Family Medicine

## 2019-12-01 MED ORDER — OXYCODONE-ACETAMINOPHEN 5-325 MG PO TABS: 1.0000 | ORAL_TABLET | Freq: Three times a day (TID) | ORAL | 0 refills | Status: DC | PRN

## 2019-12-01 NOTE — Telephone Encounter (Signed)
Seen 11/10/19 for pain management and referral was put in yesterday to pain management

## 2019-12-01 NOTE — Telephone Encounter (Signed)
Patient is calling about referral to pain management because he only has eight pills left and confused on who is sending his information to pain clinic. Is requesting refill on oxycodone 7.5/325 until he can get seen .Please advise

## 2019-12-02 NOTE — Telephone Encounter (Signed)
Discussed with pt and pt verbalized understanding.  °

## 2019-12-02 NOTE — Telephone Encounter (Signed)
Left message to return call 

## 2019-12-09 ENCOUNTER — Encounter: Payer: Self-pay | Admitting: Family Medicine

## 2019-12-14 DIAGNOSIS — Z1331 Encounter for screening for depression: Secondary | ICD-10-CM | POA: Diagnosis not present

## 2019-12-14 DIAGNOSIS — M25542 Pain in joints of left hand: Secondary | ICD-10-CM | POA: Diagnosis not present

## 2019-12-14 DIAGNOSIS — E559 Vitamin D deficiency, unspecified: Secondary | ICD-10-CM | POA: Diagnosis not present

## 2019-12-14 DIAGNOSIS — Z1159 Encounter for screening for other viral diseases: Secondary | ICD-10-CM | POA: Diagnosis not present

## 2019-12-14 DIAGNOSIS — Z1339 Encounter for screening examination for other mental health and behavioral disorders: Secondary | ICD-10-CM | POA: Diagnosis not present

## 2019-12-14 DIAGNOSIS — Z79899 Other long term (current) drug therapy: Secondary | ICD-10-CM | POA: Diagnosis not present

## 2019-12-14 DIAGNOSIS — M129 Arthropathy, unspecified: Secondary | ICD-10-CM | POA: Diagnosis not present

## 2019-12-14 DIAGNOSIS — M25569 Pain in unspecified knee: Secondary | ICD-10-CM | POA: Diagnosis not present

## 2019-12-14 DIAGNOSIS — F17210 Nicotine dependence, cigarettes, uncomplicated: Secondary | ICD-10-CM | POA: Diagnosis not present

## 2019-12-15 ENCOUNTER — Ambulatory Visit: Payer: Medicare HMO | Admitting: Physician Assistant

## 2019-12-15 ENCOUNTER — Other Ambulatory Visit: Payer: Self-pay

## 2019-12-15 ENCOUNTER — Encounter: Payer: Self-pay | Admitting: Physician Assistant

## 2019-12-15 DIAGNOSIS — L738 Other specified follicular disorders: Secondary | ICD-10-CM | POA: Diagnosis not present

## 2019-12-15 DIAGNOSIS — L820 Inflamed seborrheic keratosis: Secondary | ICD-10-CM | POA: Diagnosis not present

## 2019-12-15 DIAGNOSIS — Z85828 Personal history of other malignant neoplasm of skin: Secondary | ICD-10-CM

## 2019-12-15 DIAGNOSIS — D485 Neoplasm of uncertain behavior of skin: Secondary | ICD-10-CM | POA: Diagnosis not present

## 2019-12-15 DIAGNOSIS — L430 Hypertrophic lichen planus: Secondary | ICD-10-CM | POA: Diagnosis not present

## 2019-12-15 DIAGNOSIS — L570 Actinic keratosis: Secondary | ICD-10-CM | POA: Diagnosis not present

## 2019-12-15 NOTE — Patient Instructions (Signed)

## 2019-12-15 NOTE — Progress Notes (Signed)
   Follow up Visit  Subjective  Dustin Walker is a 69 y.o. male who presents for the following: Skin Problem (Check patients back has a few new spots. ).  Started with 1 spot in July and now he has gotten 7-8 more new spots. There is one on  His shoulder that is itching.   Objective  Well appearing patient in no apparent distress; mood and affect are within normal limits.  All skin waist up examined not including scalp. No suspicious moles noted on back.   Objective  Left Shoulder - Posterior: Pink scaling patch     Objective  Mid Back: Crusty papule     Objective  Right Upper Back: Crusty papule     Objective  Mid Back (3): Erythematous stuck-on, waxy papule or plaque.   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  2017  Neoplasm of uncertain behavior of skin (3) Left Shoulder - Posterior  Skin / nail biopsy Type of biopsy: tangential   Informed consent: discussed and consent obtained   Timeout: patient name, date of birth, surgical site, and procedure verified   Procedure prep:  Patient was prepped and draped in usual sterile fashion (Non sterile) Prep type:  Chlorhexidine Anesthesia: the lesion was anesthetized in a standard fashion   Anesthetic:  1% lidocaine w/ epinephrine 1-100,000 local infiltration Instrument used: flexible razor blade   Outcome: patient tolerated procedure well   Post-procedure details: wound care instructions given    Specimen 1 - Surgical pathology Differential Diagnosis: scc vs bcc Check Margins: No  Mid Back  Skin / nail biopsy Type of biopsy: tangential   Informed consent: discussed and consent obtained   Timeout: patient name, date of birth, surgical site, and procedure verified   Procedure prep:  Patient was prepped and draped in usual sterile fashion (Non sterile) Prep type:   Chlorhexidine Anesthesia: the lesion was anesthetized in a standard fashion   Anesthetic:  1% lidocaine w/ epinephrine 1-100,000 local infiltration Instrument used: flexible razor blade   Outcome: patient tolerated procedure well   Post-procedure details: wound care instructions given    Specimen 2 - Surgical pathology Differential Diagnosis: scc vs bcc Check Margins: No  Right Upper Back  Skin / nail biopsy Type of biopsy: tangential   Informed consent: discussed and consent obtained   Timeout: patient name, date of birth, surgical site, and procedure verified   Procedure prep:  Patient was prepped and draped in usual sterile fashion (Non sterile) Prep type:  Chlorhexidine Anesthesia: the lesion was anesthetized in a standard fashion   Anesthetic:  1% lidocaine w/ epinephrine 1-100,000 local infiltration Instrument used: flexible razor blade   Outcome: patient tolerated procedure well   Post-procedure details: wound care instructions given    Specimen 3 - Surgical pathology Differential Diagnosis: scc vs bcc Check Margins: No  Inflamed seborrheic keratosis (3) Mid Back  Destruction of lesion - Mid Back Complexity: simple   Destruction method: cryotherapy   Informed consent: discussed and consent obtained   Timeout:  patient name, date of birth, surgical site, and procedure verified Lesion destroyed using liquid nitrogen: Yes   Outcome: patient tolerated procedure well with no complications

## 2019-12-21 ENCOUNTER — Other Ambulatory Visit: Payer: Self-pay | Admitting: *Deleted

## 2019-12-21 ENCOUNTER — Ambulatory Visit: Payer: Medicare HMO | Admitting: Family Medicine

## 2019-12-21 MED ORDER — PANTOPRAZOLE SODIUM 40 MG PO TBEC: 40.0000 mg | DELAYED_RELEASE_TABLET | Freq: Every day | ORAL | 0 refills | Status: AC

## 2019-12-21 NOTE — Telephone Encounter (Signed)
-----   Message from Arlyss Gandy, [place] sent at 12/20/2019  3:27 PM EDT ----- Precancer shoulder. Recheck in 2 months.

## 2019-12-28 DIAGNOSIS — Z79899 Other long term (current) drug therapy: Secondary | ICD-10-CM | POA: Diagnosis not present

## 2019-12-28 DIAGNOSIS — F1120 Opioid dependence, uncomplicated: Secondary | ICD-10-CM | POA: Diagnosis not present

## 2019-12-28 DIAGNOSIS — M25532 Pain in left wrist: Secondary | ICD-10-CM | POA: Diagnosis not present

## 2019-12-28 DIAGNOSIS — M25561 Pain in right knee: Secondary | ICD-10-CM | POA: Diagnosis not present

## 2019-12-28 DIAGNOSIS — F17210 Nicotine dependence, cigarettes, uncomplicated: Secondary | ICD-10-CM | POA: Diagnosis not present

## 2019-12-28 DIAGNOSIS — M25542 Pain in joints of left hand: Secondary | ICD-10-CM | POA: Diagnosis not present

## 2019-12-29 ENCOUNTER — Other Ambulatory Visit: Payer: Self-pay | Admitting: *Deleted

## 2019-12-29 ENCOUNTER — Ambulatory Visit: Payer: Medicare HMO | Admitting: Family Medicine

## 2019-12-29 MED ORDER — TOPIRAMATE 25 MG PO TABS: 50.0000 mg | ORAL_TABLET | Freq: Two times a day (BID) | ORAL | 0 refills | Status: DC

## 2020-01-23 DIAGNOSIS — H6692 Otitis media, unspecified, left ear: Secondary | ICD-10-CM | POA: Diagnosis not present

## 2020-01-23 DIAGNOSIS — H9202 Otalgia, left ear: Secondary | ICD-10-CM | POA: Diagnosis not present

## 2020-01-23 DIAGNOSIS — R42 Dizziness and giddiness: Secondary | ICD-10-CM | POA: Diagnosis not present

## 2020-01-24 ENCOUNTER — Telehealth: Payer: Self-pay | Admitting: *Deleted

## 2020-01-24 NOTE — Telephone Encounter (Signed)
Pt called to let dr taylor know he found a new pcp. States he has already seen new doctor. Please take dr Lovena Le off of his chart as pcp.

## 2020-01-27 DIAGNOSIS — M25569 Pain in unspecified knee: Secondary | ICD-10-CM | POA: Diagnosis not present

## 2020-01-27 DIAGNOSIS — F17210 Nicotine dependence, cigarettes, uncomplicated: Secondary | ICD-10-CM | POA: Diagnosis not present

## 2020-01-27 DIAGNOSIS — M25542 Pain in joints of left hand: Secondary | ICD-10-CM | POA: Diagnosis not present

## 2020-01-27 DIAGNOSIS — H6692 Otitis media, unspecified, left ear: Secondary | ICD-10-CM | POA: Diagnosis not present

## 2020-01-27 DIAGNOSIS — Z79899 Other long term (current) drug therapy: Secondary | ICD-10-CM | POA: Diagnosis not present

## 2020-02-03 ENCOUNTER — Telehealth: Payer: Self-pay | Admitting: *Deleted

## 2020-02-03 NOTE — Telephone Encounter (Signed)
Pt called in complaining of constipation.  Tried Miralax, suppositories, and magnesium citrate.  No results.  Pt says he is scared that he has a blockage.  He said last bm over a week ago.  Says it has never been this long.  Wants to know what we recommend to help him.

## 2020-02-03 NOTE — Telephone Encounter (Signed)
Called and made pt aware of Neil Crouch, PA's recommendations.  Pt voiced understanding. He got his wife to jot down all instructions given.  Wife read back all instructions to me to make sure she had them wrote down right.

## 2020-02-03 NOTE — Telephone Encounter (Signed)
Signs of a blockage would be abdominal cramping and vomiting. If he develops those symptoms, he should go to the ER.   We have not seen him in over two years.  Recommend an OV for evaluation. In the interim, he can take bisacodyl 15mg . Followed by miralax one capful in 4 ounces of liquid every hour for five hours. Take full glass of water 30 minutes after every dose of miralax.

## 2020-02-09 DIAGNOSIS — Z79899 Other long term (current) drug therapy: Secondary | ICD-10-CM | POA: Diagnosis not present

## 2020-02-09 DIAGNOSIS — R0602 Shortness of breath: Secondary | ICD-10-CM | POA: Diagnosis not present

## 2020-02-09 DIAGNOSIS — E7800 Pure hypercholesterolemia, unspecified: Secondary | ICD-10-CM | POA: Diagnosis not present

## 2020-02-09 DIAGNOSIS — R5383 Other fatigue: Secondary | ICD-10-CM | POA: Diagnosis not present

## 2020-02-09 DIAGNOSIS — Z23 Encounter for immunization: Secondary | ICD-10-CM | POA: Diagnosis not present

## 2020-02-09 DIAGNOSIS — Z131 Encounter for screening for diabetes mellitus: Secondary | ICD-10-CM | POA: Diagnosis not present

## 2020-02-09 DIAGNOSIS — Z0000 Encounter for general adult medical examination without abnormal findings: Secondary | ICD-10-CM | POA: Diagnosis not present

## 2020-02-09 DIAGNOSIS — Z125 Encounter for screening for malignant neoplasm of prostate: Secondary | ICD-10-CM | POA: Diagnosis not present

## 2020-02-09 DIAGNOSIS — E559 Vitamin D deficiency, unspecified: Secondary | ICD-10-CM | POA: Diagnosis not present

## 2020-02-09 DIAGNOSIS — J449 Chronic obstructive pulmonary disease, unspecified: Secondary | ICD-10-CM | POA: Diagnosis not present

## 2020-02-09 DIAGNOSIS — Z7251 High risk heterosexual behavior: Secondary | ICD-10-CM | POA: Diagnosis not present

## 2020-02-09 DIAGNOSIS — Z9181 History of falling: Secondary | ICD-10-CM | POA: Diagnosis not present

## 2020-02-09 DIAGNOSIS — F17210 Nicotine dependence, cigarettes, uncomplicated: Secondary | ICD-10-CM | POA: Diagnosis not present

## 2020-02-09 DIAGNOSIS — I10 Essential (primary) hypertension: Secondary | ICD-10-CM | POA: Diagnosis not present

## 2020-02-09 DIAGNOSIS — Z1159 Encounter for screening for other viral diseases: Secondary | ICD-10-CM | POA: Diagnosis not present

## 2020-02-09 NOTE — Telephone Encounter (Signed)
error 

## 2020-04-10 ENCOUNTER — Ambulatory Visit: Payer: Medicare HMO | Admitting: Dermatology

## 2020-04-10 ENCOUNTER — Encounter: Payer: Self-pay | Admitting: Dermatology

## 2020-04-10 ENCOUNTER — Other Ambulatory Visit: Payer: Self-pay

## 2020-04-10 DIAGNOSIS — Z85828 Personal history of other malignant neoplasm of skin: Secondary | ICD-10-CM

## 2020-04-10 DIAGNOSIS — D2362 Other benign neoplasm of skin of left upper limb, including shoulder: Secondary | ICD-10-CM | POA: Diagnosis not present

## 2020-04-10 DIAGNOSIS — Z1283 Encounter for screening for malignant neoplasm of skin: Secondary | ICD-10-CM | POA: Diagnosis not present

## 2020-04-10 DIAGNOSIS — D239 Other benign neoplasm of skin, unspecified: Secondary | ICD-10-CM

## 2020-04-11 ENCOUNTER — Encounter: Payer: Self-pay | Admitting: Dermatology

## 2020-04-11 NOTE — Progress Notes (Signed)
   Follow-Up Visit   Subjective  Dustin Walker is a 69 y.o. male who presents for the following: Follow-up (2 mont follow up on left shoulder- AK ).  Recheck left shoulder and a few other spots Location:  Duration:  Quality:  Associated Signs/Symptoms: Modifying Factors:  Severity:  Timing: Context:   Objective  Well appearing patient in no apparent distress; mood and affect are within normal limits.  All skin waist up examined.   Assessment & Plan    Hydrocystoma Left Shoulder - Posterior  Leave if stable.  Encounter for screening for malignant neoplasm of skin Mid Back  Yearly skin check-    History of basal cell cancer Right Upper Back  Yearly skin check  History of squamous cell carcinoma of skin Left Upper Back  Yearly skin check     I, Lavonna Monarch, MD, have reviewed all documentation for this visit.  The documentation on 04/11/20 for the exam, diagnosis, procedures, and orders are all accurate and complete.

## 2020-11-07 ENCOUNTER — Telehealth: Payer: Medicare HMO | Admitting: *Deleted

## 2020-11-07 ENCOUNTER — Encounter: Payer: Self-pay | Admitting: Family Medicine

## 2020-11-07 NOTE — Telephone Encounter (Signed)
A letter was dictated This letter must be sent certified mail Thank you

## 2020-11-07 NOTE — Telephone Encounter (Addendum)
  Regarding: reminder file Pt needs repeat ct chest with contrast in 2 years (around 12/05/2020)  Dx. Pulmonary nodules   Patient is in reminder file from 11/2018 to have repeat CT -Patient is no longer a patient at the practice- please advise

## 2021-01-02 ENCOUNTER — Other Ambulatory Visit: Payer: Self-pay | Admitting: Neurosurgery

## 2021-02-01 ENCOUNTER — Other Ambulatory Visit
Admission: RE | Admit: 2021-02-01 | Discharge: 2021-02-01 | Disposition: A | Discharge status: Home / Self Care | Payer: Medicare Other | Source: Ambulatory Visit | Attending: Neurosurgery | Admitting: Neurosurgery

## 2021-02-01 ENCOUNTER — Other Ambulatory Visit: Payer: Self-pay

## 2021-02-01 DIAGNOSIS — M79606 Pain in leg, unspecified: Secondary | ICD-10-CM | POA: Diagnosis not present

## 2021-02-01 DIAGNOSIS — G894 Chronic pain syndrome: Secondary | ICD-10-CM | POA: Insufficient documentation

## 2021-02-01 DIAGNOSIS — Z01818 Encounter for other preprocedural examination: Secondary | ICD-10-CM | POA: Diagnosis not present

## 2021-02-01 DIAGNOSIS — I252 Old myocardial infarction: Secondary | ICD-10-CM | POA: Insufficient documentation

## 2021-02-01 HISTORY — DX: Headache, unspecified: R51.9

## 2021-02-01 HISTORY — DX: Gastro-esophageal reflux disease without esophagitis: K21.9

## 2021-02-01 LAB — SURGICAL PCR SCREEN
MRSA, PCR: NEGATIVE
Staphylococcus aureus: NEGATIVE

## 2021-02-01 LAB — CBC
HCT: 41 % (ref 39.0–52.0)
Hemoglobin: 13.8 g/dL (ref 13.0–17.0)
MCH: 31.9 pg (ref 26.0–34.0)
MCHC: 33.7 g/dL (ref 30.0–36.0)
MCV: 94.7 fL (ref 80.0–100.0)
Platelets: 204 10*3/uL (ref 150–400)
RBC: 4.33 MIL/uL (ref 4.22–5.81)
RDW: 13.4 % (ref 11.5–15.5)
WBC: 6.3 10*3/uL (ref 4.0–10.5)
nRBC: 0 % (ref 0.0–0.2)

## 2021-02-01 LAB — BASIC METABOLIC PANEL
Anion gap: 6 (ref 5–15)
BUN: 32 mg/dL — ABNORMAL HIGH (ref 8–23)
CO2: 23 mmol/L (ref 22–32)
Calcium: 8.7 mg/dL — ABNORMAL LOW (ref 8.9–10.3)
Chloride: 107 mmol/L (ref 98–111)
Creatinine, Ser: 2.03 mg/dL — ABNORMAL HIGH (ref 0.61–1.24)
GFR, Estimated: 35 mL/min — ABNORMAL LOW (ref >60)
Glucose, Bld: 91 mg/dL (ref 70–99)
Potassium: 4.2 mmol/L (ref 3.5–5.1)
Sodium: 136 mmol/L (ref 135–145)

## 2021-02-01 LAB — PROTIME-INR
INR: 1.1 (ref 0.8–1.2)
Prothrombin Time: 13.7 seconds (ref 11.4–15.2)

## 2021-02-01 LAB — TYPE AND SCREEN
ABO/RH(D): O POS
Antibody Screen: NEGATIVE

## 2021-02-01 LAB — APTT: aPTT: 34 seconds (ref 24–36)

## 2021-02-01 NOTE — Progress Notes (Signed)
  [facility] Perioperative Services: Pre-Admission/Anesthesia Testing  Abnormal Lab Notification   Date: 02/01/21  Name: DACODA FINLAY MRN:   [id number]  Re: Abnormal labs noted during PAT appointment   Provider(s) Notified: [name], MD Notification mode: Routed and/or faxed via [place] LAB VALUE(S): Lab Results  Component Value Date   BUN 32 (H) 02/01/2021   CREATININE 2.03 (H) 02/01/2021   GFRNONAA 35 (L) 02/01/2021   Notes:  Patient is scheduled for a REPLACEMENT PULSE GENERATOR LEFT FLANK (MEDTRONIC) (Left) on 02/11/2021.   This is a Community education officer; no formal response is required.  Honor Loh, MSN, APRN, FNP-C, CEN [facility]  Peri-operative Services Nurse Practitioner Phone: [phone] Fax: [phone] 02/01/21 1:21 PM

## 2021-02-01 NOTE — Patient Instructions (Addendum)
Your procedure is scheduled on:02-11-21 Monday Report to the Registration Desk on the 1st floor of the [facility].Then proceed to the 2nd floor Surgery Desk in the [facility] To find out your arrival time, please call [phone] between 1PM - 3PM on:02-08-21 Friday  REMEMBER: Instructions that are not followed completely may result in serious medical risk, up to and including death; or upon the discretion of your surgeon and anesthesiologist your surgery may need to be rescheduled.  Do not eat food after midnight the night before surgery.  No gum chewing, lozengers or hard candies.  You may however, drink CLEAR liquids up to 2 hours before you are scheduled to arrive for your surgery. Do not drink anything within 2 hours of your scheduled arrival time.  Clear liquids include: - water  - apple juice without pulp - gatorade (not RED, PURPLE, OR BLUE) - black coffee or tea (Do NOT add milk or creamers to the coffee or tea) Do NOT drink anything that is not on this list.  TAKE THESE MEDICATIONS THE MORNING OF SURGERY WITH A SIP OF WATER: -topiramate (TOPAMAX) 50 MG tablet -pantoprazole (PROTONIX) 40 MG tablet-(take one the night before and one on the morning of surgery - helps to prevent nausea after surgery.) -oxyCODONE-acetaminophen (PERCOCET) 7.5-325 MG tablet  Use your Albuterol Inhaler the day of surgery and bring Albuterol Inhaler to the hospital  One week prior to surgery: Stop Anti-inflammatories (NSAIDS) such as Advil, Aleve, Ibuprofen, Motrin, Naproxen, Naprosyn and Aspirin based products such as Excedrin, Goodys Powder, BC Powder.You may however, continue to take Tylenol/Percocet if needed for pain up until the day of surgery.  Stop ANY OVER THE COUNTER supplements/vitamins 7 days prior to surgery (Vitamin D) You may however, continue to take Tylenol if needed for pain up until the day of surgery.  No Alcohol for 24 hours before or after surgery.  No Smoking including  e-cigarettes for 24 hours prior to surgery.  No chewable tobacco products for at least 6 hours prior to surgery.  No nicotine patches on the day of surgery.  Do not use any "recreational" drugs for at least a week prior to your surgery.  Please be advised that the combination of cocaine and anesthesia may have negative outcomes, up to and including death. If you test positive for cocaine, your surgery will be cancelled.  On the morning of surgery brush your teeth with toothpaste and water, you may rinse your mouth with mouthwash if you wish. Do not swallow any toothpaste or mouthwash.  Use CHG Soap as directed on instruction sheet.  Do not wear jewelry, make-up, hairpins, clips or nail polish.  Do not wear lotions, powders, or perfumes.   Do not shave body from the neck down 48 hours prior to surgery just in case you cut yourself which could leave a site for infection.  Also, freshly shaved skin may become irritated if using the CHG soap.  Contact lenses, hearing aids and dentures may not be worn into surgery.  Do not bring valuables to the hospital. [facility] is not responsible for any missing/lost belongings or valuables.   Notify your doctor if there is any change in your medical condition (cold, fever, infection).  Wear comfortable clothing (specific to your surgery type) to the hospital.  After surgery, you can help prevent lung complications by doing breathing exercises.  Take deep breaths and cough every 1-2 hours. Your doctor may order a device called an Incentive Spirometer to help you  take deep breaths. When coughing or sneezing, hold a pillow firmly against your incision with both hands. This is called "splinting." Doing this helps protect your incision. It also decreases belly discomfort.  If you are being admitted to the hospital overnight, leave your suitcase in the car. After surgery it may be brought to your room.  If you are being discharged the day of surgery,  you will not be allowed to drive home. You will need a responsible adult (18 years or older) to drive you home and stay with you that night.   If you are taking public transportation, you will need to have a responsible adult (18 years or older) with you. Please confirm with your physician that it is acceptable to use public transportation.   Please call the [facility] Dept. at [phone] if you have any questions about these instructions.  Surgery Visitation Policy:  Patients undergoing a surgery or procedure may have one family member or support person with them as long as that person is not COVID-19 positive or experiencing its symptoms.  That person may remain in the waiting area during the procedure and may rotate out with other people.  Inpatient Visitation:    Visiting hours are 7 a.m. to 8 p.m. Up to two visitors ages 16+ are allowed at one time in a patient room. The visitors may rotate out with other people during the day. Visitors must check out when they leave, or other visitors will not be allowed. One designated support person may remain overnight. The visitor must pass COVID-19 screenings, use hand sanitizer when entering and exiting the patient's room and wear a mask at all times, including in the patient's room. Patients must also wear a mask when staff or their visitor are in the room. Masking is required regardless of vaccination status.

## 2021-02-11 ENCOUNTER — Encounter: Payer: Self-pay | Admitting: Neurosurgery

## 2021-02-11 ENCOUNTER — Ambulatory Visit
Admission: RE | Admit: 2021-02-11 | Discharge: 2021-02-11 | Disposition: A | Discharge status: Home / Self Care | Payer: Medicare Other | Attending: Neurosurgery | Admitting: Neurosurgery

## 2021-02-11 ENCOUNTER — Other Ambulatory Visit: Payer: Self-pay

## 2021-02-11 ENCOUNTER — Ambulatory Visit: Payer: Medicare Other | Admitting: Certified Registered"

## 2021-02-11 ENCOUNTER — Encounter
Admission: RE | Disposition: A | Discharge status: Home / Self Care | Payer: Self-pay | Source: Home / Self Care | Attending: Neurosurgery

## 2021-02-11 DIAGNOSIS — Z79899 Other long term (current) drug therapy: Secondary | ICD-10-CM | POA: Diagnosis not present

## 2021-02-11 DIAGNOSIS — Z885 Allergy status to narcotic agent status: Secondary | ICD-10-CM | POA: Diagnosis not present

## 2021-02-11 DIAGNOSIS — Z85828 Personal history of other malignant neoplasm of skin: Secondary | ICD-10-CM | POA: Insufficient documentation

## 2021-02-11 DIAGNOSIS — Z4542 Encounter for adjustment and management of neuropacemaker (brain) (peripheral nerve) (spinal cord): Secondary | ICD-10-CM | POA: Insufficient documentation

## 2021-02-11 DIAGNOSIS — Z886 Allergy status to analgesic agent status: Secondary | ICD-10-CM | POA: Insufficient documentation

## 2021-02-11 DIAGNOSIS — Z96652 Presence of left artificial knee joint: Secondary | ICD-10-CM | POA: Insufficient documentation

## 2021-02-11 DIAGNOSIS — F17210 Nicotine dependence, cigarettes, uncomplicated: Secondary | ICD-10-CM | POA: Insufficient documentation

## 2021-02-11 DIAGNOSIS — G894 Chronic pain syndrome: Secondary | ICD-10-CM | POA: Diagnosis not present

## 2021-02-11 HISTORY — PX: SPINAL CORD STIMULATOR BATTERY EXCHANGE: SHX6202

## 2021-02-11 SURGERY — SPINAL CORD STIMULATOR BATTERY EXCHANGE
Anesthesia: Monitor Anesthesia Care | Laterality: Left

## 2021-02-11 MED ORDER — ONDANSETRON HCL 4 MG/2ML IJ SOLN
INTRAMUSCULAR | Status: DC | PRN
  Administered 2021-02-11: 4 mg via INTRAVENOUS

## 2021-02-11 MED ORDER — PROPOFOL 10 MG/ML IV BOLUS
INTRAVENOUS | Status: AC
  Filled 2021-02-11: qty 100

## 2021-02-11 MED ORDER — DEXAMETHASONE SODIUM PHOSPHATE 10 MG/ML IJ SOLN
INTRAMUSCULAR | Status: AC
  Filled 2021-02-11: qty 1

## 2021-02-11 MED ORDER — 0.9 % SODIUM CHLORIDE (POUR BTL) OPTIME
TOPICAL | Status: DC | PRN
  Administered 2021-02-11: 1000 mL

## 2021-02-11 MED ORDER — CEFAZOLIN SODIUM-DEXTROSE 2-4 GM/100ML-% IV SOLN
INTRAVENOUS | Status: AC
  Filled 2021-02-11: qty 100

## 2021-02-11 MED ORDER — SODIUM CHLORIDE 0.9 % IV SOLN
INTRAVENOUS | Status: DC | PRN
  Administered 2021-02-11 (×5): 100 ug via INTRAVENOUS

## 2021-02-11 MED ORDER — LACTATED RINGERS IV SOLN: INTRAVENOUS | Status: DC

## 2021-02-11 MED ORDER — CHLORHEXIDINE GLUCONATE 0.12 % MT SOLN: 15.0000 mL | Freq: Once | OROMUCOSAL | Status: AC

## 2021-02-11 MED ORDER — FENTANYL CITRATE (PF) 100 MCG/2ML IJ SOLN: 25.0000 ug | INTRAMUSCULAR | Status: DC | PRN

## 2021-02-11 MED ORDER — PROPOFOL 500 MG/50ML IV EMUL
INTRAVENOUS | Status: DC | PRN
  Administered 2021-02-11: 50 ug/kg/min via INTRAVENOUS

## 2021-02-11 MED ORDER — EPHEDRINE SULFATE 50 MG/ML IJ SOLN
INTRAMUSCULAR | Status: DC | PRN
  Administered 2021-02-11: 10 mg via INTRAVENOUS
  Administered 2021-02-11: 15 mg via INTRAVENOUS

## 2021-02-11 MED ORDER — ORAL CARE MOUTH RINSE: 15.0000 mL | Freq: Once | OROMUCOSAL | Status: AC

## 2021-02-11 MED ORDER — METHOCARBAMOL 500 MG PO TABS: 500.0000 mg | ORAL_TABLET | Freq: Four times a day (QID) | ORAL | 0 refills | Status: DC

## 2021-02-11 MED ORDER — PHENYLEPHRINE HCL (PRESSORS) 10 MG/ML IV SOLN
INTRAVENOUS | Status: AC
  Filled 2021-02-11: qty 1

## 2021-02-11 MED ORDER — FENTANYL CITRATE (PF) 100 MCG/2ML IJ SOLN
INTRAMUSCULAR | Status: AC
  Filled 2021-02-11: qty 2

## 2021-02-11 MED ORDER — FENTANYL CITRATE (PF) 100 MCG/2ML IJ SOLN
INTRAMUSCULAR | Status: DC | PRN
  Administered 2021-02-11 (×2): 25 ug via INTRAVENOUS
  Administered 2021-02-11: 50 ug via INTRAVENOUS

## 2021-02-11 MED ORDER — MIDAZOLAM HCL 2 MG/2ML IJ SOLN
INTRAMUSCULAR | Status: DC | PRN
  Administered 2021-02-11: 2 mg via INTRAVENOUS

## 2021-02-11 MED ORDER — CHLORHEXIDINE GLUCONATE 0.12 % MT SOLN
OROMUCOSAL | Status: AC
  Administered 2021-02-11: 15 mL via OROMUCOSAL
  Filled 2021-02-11: qty 15

## 2021-02-11 MED ORDER — LIDOCAINE HCL (PF) 2 % IJ SOLN
INTRAMUSCULAR | Status: AC
  Filled 2021-02-11: qty 5

## 2021-02-11 MED ORDER — KETAMINE HCL 50 MG/5ML IJ SOSY
PREFILLED_SYRINGE | INTRAMUSCULAR | Status: AC
  Filled 2021-02-11: qty 5

## 2021-02-11 MED ORDER — DEXMEDETOMIDINE (PRECEDEX) IN NS 20 MCG/5ML (4 MCG/ML) IV SYRINGE
PREFILLED_SYRINGE | INTRAVENOUS | Status: AC
  Filled 2021-02-11: qty 10

## 2021-02-11 MED ORDER — ONDANSETRON HCL 4 MG/2ML IJ SOLN
INTRAMUSCULAR | Status: AC
  Filled 2021-02-11: qty 2

## 2021-02-11 MED ORDER — BUPIVACAINE-EPINEPHRINE (PF) 0.5% -1:200000 IJ SOLN
INTRAMUSCULAR | Status: DC | PRN
  Administered 2021-02-11: 15 mL via PERINEURAL

## 2021-02-11 MED ORDER — MIDAZOLAM HCL 2 MG/2ML IJ SOLN
INTRAMUSCULAR | Status: AC
  Filled 2021-02-11: qty 2

## 2021-02-11 MED ORDER — CEFAZOLIN SODIUM-DEXTROSE 2-4 GM/100ML-% IV SOLN
2.0000 g | INTRAVENOUS | Status: AC
  Administered 2021-02-11: 2 g via INTRAVENOUS

## 2021-02-11 MED ORDER — LIDOCAINE HCL (CARDIAC) PF 100 MG/5ML IV SOSY
PREFILLED_SYRINGE | INTRAVENOUS | Status: DC | PRN
  Administered 2021-02-11: 100 mg via INTRAVENOUS

## 2021-02-11 SURGICAL SUPPLY — 47 items
ADH SKN CLS APL DERMABOND .7 (GAUZE/BANDAGES/DRESSINGS) ×1
APL PRP STRL LF DISP 70% ISPRP (MISCELLANEOUS) ×2
CHLORAPREP W/TINT 26 (MISCELLANEOUS) ×4 IMPLANT
COUNTER NEEDLE 20/40 LG (NEEDLE) ×2 IMPLANT
COVER LIGHT HANDLE STERIS (MISCELLANEOUS) ×4 IMPLANT
DERMABOND ADVANCED (GAUZE/BANDAGES/DRESSINGS) ×1
DERMABOND ADVANCED .7 DNX12 (GAUZE/BANDAGES/DRESSINGS) IMPLANT
DEVICE IMPLANT NEUROSTIMULATOR (Neuro Prosthesis/Implant) ×1 IMPLANT
DRAPE C-ARM XRAY 36X54 (DRAPES) ×2 IMPLANT
DRAPE C-ARMOR (DRAPES) IMPLANT
DRAPE LAPAROTOMY 100X77 ABD (DRAPES) ×2 IMPLANT
DRAPE SURG 17X11 SM STRL (DRAPES) ×2 IMPLANT
DRSG TEGADERM 4X4.75 (GAUZE/BANDAGES/DRESSINGS) ×2 IMPLANT
ELECT CAUTERY BLADE TIP 2.5 (TIP) ×2
ELECTRODE CAUTERY BLDE TIP 2.5 (TIP) ×1 IMPLANT
ENVELOPE ABSORB ANTIBACTERIAL (Mesh General) ×2 IMPLANT
GAUZE 4X4 16PLY ~~LOC~~+RFID DBL (SPONGE) ×2 IMPLANT
GAUZE SPONGE 4X4 12PLY STRL (GAUZE/BANDAGES/DRESSINGS) ×2 IMPLANT
GLOVE SRG 8 PF TXTR STRL LF DI (GLOVE) ×1 IMPLANT
GLOVE SURG SYN 6.5 ES PF (GLOVE) ×4 IMPLANT
GLOVE SURG SYN 6.5 PF PI (GLOVE) ×2 IMPLANT
GLOVE SURG SYN 8.0 (GLOVE) ×2 IMPLANT
GLOVE SURG SYN 8.0 PF PI (GLOVE) ×1 IMPLANT
GLOVE SURG UNDER POLY LF SZ6.5 (GLOVE) ×4 IMPLANT
GLOVE SURG UNDER POLY LF SZ8 (GLOVE) ×2
GOWN SRG LRG LVL 4 IMPRV REINF (GOWNS) ×2 IMPLANT
GOWN STRL REIN LRG LVL4 (GOWNS) ×4
GRADUATE 1200CC STRL 31836 (MISCELLANEOUS) ×2 IMPLANT
KIT TURNOVER KIT A (KITS) ×2 IMPLANT
MANIFOLD NEPTUNE II (INSTRUMENTS) ×2 IMPLANT
MARKER SKIN DUAL TIP RULER LAB (MISCELLANEOUS) ×2 IMPLANT
NS IRRIG 1000ML POUR BTL (IV SOLUTION) ×2 IMPLANT
PACK LAMINECTOMY NEURO (CUSTOM PROCEDURE TRAY) ×2 IMPLANT
PAD ARMBOARD 7.5X6 YLW CONV (MISCELLANEOUS) ×2 IMPLANT
POUCH TYRX ANTIBAC NEURO MED (Mesh General) IMPLANT
RECHARGER INTELLIS (NEUROSURGERY SUPPLIES) ×1 IMPLANT
REPROGRAMMER INTELLIS (NEUROSURGERY SUPPLIES) ×1 IMPLANT
SPONGE T-LAP 4X18 ~~LOC~~+RFID (SPONGE) ×2 IMPLANT
SUT DVC VLOC 3-0 CL 6 P-12 (SUTURE) ×1 IMPLANT
SUT ETHILON 3-0 FS-10 30 BLK (SUTURE) ×4
SUT POLYSORB 2-0 5X18 GS-10 (SUTURE) ×6 IMPLANT
SUT SILK 2 0 SH (SUTURE) ×6 IMPLANT
SUT VIC AB 0 CT1 18XCR BRD 8 (SUTURE) ×1 IMPLANT
SUT VIC AB 0 CT1 8-18 (SUTURE) ×2
SUTURE EHLN 3-0 FS-10 30 BLK (SUTURE) ×2 IMPLANT
TOWEL OR 17X26 4PK STRL BLUE (TOWEL DISPOSABLE) ×4 IMPLANT
WATER STERILE IRR 500ML POUR (IV SOLUTION) ×2 IMPLANT

## 2021-02-11 NOTE — Anesthesia Preprocedure Evaluation (Signed)
Anesthesia Evaluation  Patient identified by MRN, date of birth, ID band Patient awake    Reviewed: Allergy & Precautions, NPO status , Patient's Chart, lab work & pertinent test results  History of Anesthesia Complications Negative for: history of anesthetic complications  Airway Mallampati: III  TM Distance: >3 FB Neck ROM: full    Dental  (+) Missing, Poor Dentition   Pulmonary COPD, Current Smoker and Patient abstained from smoking.,    Pulmonary exam normal        Cardiovascular Normal cardiovascular exam     Neuro/Psych  Headaches, negative psych ROS   GI/Hepatic Neg liver ROS, GERD  Controlled,  Endo/Other  negative endocrine ROS  Renal/GU Renal disease  negative genitourinary   Musculoskeletal  (+) Arthritis ,   Abdominal   Peds  Hematology negative hematology ROS (+)   Anesthesia Other Findings Past Medical History: No date: Allergic rhinitis No date: Arthritis 04/02/2015: BCC (basal cell carcinoma)     Comment:  left upper eyelid, 06/12/15 MOHs No date: Chronic pain     Comment:  left hand, crush injury in 2013 No date: COPD (chronic obstructive pulmonary disease) (HCC) No date: GERD (gastroesophageal reflux disease) No date: Headache     Comment:  migraines 10/29/2015: SCC (squamous cell carcinoma)     Comment:  well diff, upper mid bac (CX3, 5FU)  Past Surgical History: 2010: COLONOSCOPY     Comment:  Dr. Gala Romney: tubular adenoma removed. was due for               surveillance in 2015. 02/22/2018: COLONOSCOPY WITH PROPOFOL; N/A     Comment:  Procedure: COLONOSCOPY WITH PROPOFOL;  Surgeon: Daneil Dolin, MD;  [facility];  Service:               Endoscopy;  Laterality: N/A;  12:00pm No date: HAND SURGERY; Right     Comment:  middle finger 06/19/2011: I & D EXTREMITY     Comment:  Procedure: IRRIGATION AND DEBRIDEMENT EXTREMITY;                Surgeon: Dennie Bible, MD;  Location: [facility];  Service:              Plastics;; No date: JOINT REPLACEMENT No date: KNEE SURGERY; Bilateral     Comment:  6 times per knee and total knee replacement on the left 06/19/2011: ORIF ULNAR FRACTURE     Comment:  Procedure: OPEN REDUCTION INTERNAL FIXATION (ORIF) ULNAR              FRACTURE;  Surgeon: Dennie Bible, MD;  Location: [facility];  Service: Plastics;  Laterality: Left; No date: SHOULDER SURGERY; Right  BMI    Body Mass Index: 18.09 kg/m      Reproductive/Obstetrics negative OB ROS                             Anesthesia Physical Anesthesia Plan  ASA: 3  Anesthesia Plan: General   Post-op Pain Management:    Induction: Intravenous  PONV Risk Score and Plan: Propofol infusion and TIVA  Airway Management Planned: Natural Airway and Nasal Cannula  Additional Equipment:   Intra-op Plan:   Post-operative Plan:   Informed Consent: I have reviewed the patients  History and Physical, chart, labs and discussed the procedure including the risks, benefits and alternatives for the proposed anesthesia with the patient or authorized representative who has indicated his/her understanding and acceptance.     Dental Advisory Given  Plan Discussed with: Anesthesiologist, CRNA and Surgeon  Anesthesia Plan Comments: (Patient consented for risks of anesthesia including but not limited to:  - adverse reactions to medications - risk of airway placement if required - damage to eyes, teeth, lips or other oral mucosa - nerve damage due to positioning  - sore throat or hoarseness - Damage to heart, brain, nerves, lungs, other parts of body or loss of life  Patient voiced understanding.)        Anesthesia Quick Evaluation

## 2021-02-11 NOTE — Anesthesia Postprocedure Evaluation (Signed)
Anesthesia Post Note  Patient: DEEPAK BLESS  Procedure(s) Performed: REPLACEMENT PULSE GENERATOR LEFT FLANK (MEDTRONIC) (Left)  Patient location during evaluation: PACU Anesthesia Type: MAC Level of consciousness: awake and alert Pain management: pain level controlled Vital Signs Assessment: post-procedure vital signs reviewed and stable Respiratory status: spontaneous breathing, nonlabored ventilation, respiratory function stable and patient connected to nasal cannula oxygen Cardiovascular status: blood pressure returned to baseline and stable Postop Assessment: no apparent nausea or vomiting Anesthetic complications: no   No notable events documented.   Last Vitals:  Vitals:   02/11/21 0841 02/11/21 0855  BP: 104/66 106/65  Pulse: 60 65  Resp: 12 16  Temp: (!) 36.4 C (!) 36.3 C  SpO2: 97% 97%    Last Pain:  Vitals:   02/11/21 0855  TempSrc: Temporal  PainSc: 0-No pain                 Precious Haws Breella Vanostrand

## 2021-02-11 NOTE — Progress Notes (Signed)
Dr. Lacinda Axon notified of scraped area on the right wrist.  He looked at it and said just to keep it covered.

## 2021-02-11 NOTE — Discharge Instructions (Addendum)
NEUROSURGERY DISCHARGE INSTRUCTIONS  Admission diagnosis: chronic pain g89.4  Operative procedure: Replacement of pulse generator   What to do after you leave the hospital:  Recommended diet: regular diet. Increase protein intake to promote wound healing.  Recommended activity: no lifting, driving, or strenuous exercise for 4 weeks . You should walk multiple times per day  Special Instructions  No straining, no heavy lifting > 10lbs x 4 weeks.  Keep incision area clean and dry. May shower in 2 days. No baths or pools for 6 weeks.  Please remove dressing tomorrow, no need to apply a bandage afterwards  You have no sutures to remove, the skin is closed with adhesive  Please take pain medications as directed. Take a stool softener if on pain medications   Please Report any of the following: Nausea or Vomiting, Temperature is greater than 101.106F (38.1C) degrees, Dizziness, Abdominal Pain, Difficulty Breathing or Shortness of Breath, Inability to Eat, drink Fluids, or Take medications, Bleeding, swelling, or drainage from surgical incision sites, New numbness or weakness, and Bowel or bladder dysfunction to the neurosurgeon on call at [phone]  Additional Follow up appointments Please follow up with Cooper Render PA-C in [facility] as scheduled in 2-3 weeks   Please see below for scheduled appointments:  No future appointments.  AMBULATORY SURGERY  DISCHARGE INSTRUCTIONS   The drugs that you were given will stay in your system until tomorrow so for the next 24 hours you should not:  Drive an automobile Make any legal decisions Drink any alcoholic beverage   You may resume regular meals tomorrow.  Today it is better to start with liquids and gradually work up to solid foods.  You may eat anything you prefer, but it is better to start with liquids, then soup and crackers, and gradually work up to solid foods.   Please notify your doctor immediately if you have any  unusual bleeding, trouble breathing, redness and pain at the surgery site, drainage, fever, or pain not relieved by medication.    Additional Instructions:        Please contact your physician with any problems or Same Day Surgery at [phone], Monday through Friday 6 am to 4 pm, or [place] at [facility] number at [phone].

## 2021-02-11 NOTE — Op Note (Signed)
Operative Note   SURGERY DATE:  02/11/2021   PRE-OP DIAGNOSIS: Chronic Pain Syndrome (G89.4)   POST-OP DIAGNOSIS:  Chronic Pain Syndrome (G89.4)   Procedure(s) with comments: Replacement Left Flank Pulse Generator   SURGEON:     * [name], MD       Cooper Render,  PA Assistant   ANESTHESIA: General    OPERATIVE FINDINGS: Failed Spinal Cord Stimulator Battery  Indications: Mr Olmeda was seen in clinic on 8/23 and found to have inadequate coverage of back and leg pain with current spinal cord stimulator. The patient had good control of pain symptoms since it was placed. We discussed battery replacement only to control pain and return SCS to functioning status. The risks including hematoma, infection, damage to spinal cord stimulator wires, failure of relief of pain, and need for revision surgery were discussed. The patient elected to proceed with the battery exchange   Procedure:  The patient was brought to the operating room and placed supine on a standard OR table. The anesthesia service had vascular access and provided sedation medications to the patient.  The left flank incision was prepped and draped in a sterile fashion and the previous incision marked.  A hard timeout was performed.  Local anesthetic was instilled into the incision.    Incision was opened sharply to the subcutaneous layer.  Blunt dissection used to dissect to the battery and the width of this was exposed taking care not to damage the wires.  The battery was removed and the pocket and the electrode removed from the battery.  Wire was seen to be intact without any obvious damage and the scar tissue was removed using blunt dissection.   Hemostasis was obtained and the battery pocket was irrigated with saline.  The electrode were attached to the new battery and interrogated. The impedences were found to be normal.  The electrode were secured and tightened.  The battery was replaced into the pocket in antibiotic  pouch.   The deep tissue was closed with 2-0 Vicryl and subcutaneous with subcuticular stitch.  The skin was closed with surgical adhesive.  A dressing was applied.  Sedation medications were stopped and the patient was awoken from anesthesia.  Patient was taken to the PACU for further recovery and seen to be at neurologic baseline.  I discussed the results with the family        ESTIMATED BLOOD LOSS:   20 cc     IMPLANT DEVICE Lucillie Garfinkel [id number]  Inventory Item: DEVICE IMPLANT NEUROSTIMINATOR Serial no.: [id number] Model/Cat no.: [id number]  Implant name: DEVICE Lucillie Garfinkel - [id number] Laterality: Left Area: Back   Manufacturer: MEDTRONIC NEUROMOD PAIN MGMT Date of Manufacture:    Action: Implanted Number Used: 1   Device Identifier:  Device Identifier Type:     ENVELOPE ABSORB ANTIBACTERIAL - [id number]  Inventory Item: ENVELOPE ABSORB ANTIBACTERIAL Serial no.:  Model/Cat no.: [id number]  Implant name: ENVELOPE ABSORB ANTIBACTERIAL - [id number] Laterality: Left Area: Back   Manufacturer: [employer] [place] [employer] Date of Manufacture:    Action: Implanted Number Used: 1   Device Identifier:  Device Identifier Type:         I performed the case in its entirety with assistance of PA, Ernestene Kiel, [place]

## 2021-02-11 NOTE — Progress Notes (Signed)
Pharmacy Antibiotic Note  Dustin Walker is a 70 y.o. male admitted on (Not on file) with surgical prophylaxis.  Pharmacy has been consulted for Cefazolin dosing.  TBW = 53.5 kg   Plan: Cefazolin 2 gm IV X 1 60 min pre-op ordered for 10/3 @ 0500.     No data recorded.  No results for input(s): WBC, CREATININE, LATICACIDVEN, VANCOTROUGH, VANCOPEAK, VANCORANDOM, GENTTROUGH, GENTPEAK, GENTRANDOM, TOBRATROUGH, TOBRAPEAK, TOBRARND, AMIKACINPEAK, AMIKACINTROU, AMIKACIN in the last 168 hours.  Estimated Creatinine Clearance: 26.4 mL/min (A) (by C-G formula based on SCr of 2.03 mg/dL (H)).    Allergies  Allergen Reactions   Naproxen Sodium Rash and Other (See Comments)    "Burns from inside out"   Codeine Rash and Other (See Comments)    Burning    Antimicrobials this admission:   >>    >>   Dose adjustments this admission:   Microbiology results:  BCx:   UCx:    Sputum:    MRSA PCR:   Thank you for allowing pharmacy to be a part of this patient's care.  Veera Stapleton D 02/11/2021 2:18 AM

## 2021-02-11 NOTE — Transfer of Care (Signed)
Immediate Anesthesia Transfer of Care Note  Patient: Dustin Walker  Procedure(s) Performed: REPLACEMENT PULSE GENERATOR LEFT FLANK (MEDTRONIC) (Left)  Patient Location: PACU  Anesthesia Type:MAC  Level of Consciousness: alert   Airway & Oxygen Therapy: Patient Spontanous Breathing  Post-op Assessment: Report given to RN  Post vital signs: stable  Last Vitals:  Vitals Value Taken Time  BP    Temp    Pulse    Resp    SpO2      Last Pain:  Vitals:   02/11/21 0638  TempSrc: Oral  PainSc: 3          Complications: No notable events documented.

## 2021-02-11 NOTE — Discharge Summary (Signed)
Physician Discharge Summary  Patient ID: IAAN OREGEL MRN: [id number] DOB/AGE: 07-12-1950 70 y.o.  Admit date: 02/11/2021 Discharge date: 02/11/2021  Admission Diagnoses: chronic pain syndrome  Discharge Diagnoses:  Active Problems:   * No active hospital problems. *   Discharged Condition: good  Hospital Course: Dustin Walker is a 70 y.o presenting for replace of pulse generator. His interoperative course was uncomplicated. He was monitored in the PACU and was discharged home once he was able to ambulate and tolerate PO intake.   Consults: None  Significant Diagnostic Studies: none  Treatments: surgery: See above  Discharge Exam: Blood pressure 98/77, pulse 78, temperature 98.6 F (37 C), temperature source Oral, resp. rate 18, height 5\' 8"  (1.727 m), weight 54 kg, SpO2 98 %. CN II-XII grossly intact Good and equal strength throughout Incision c/d/I with clean dressing in place  Disposition: Discharge disposition: 01-Home or Self Care        Allergies as of 02/11/2021       Reactions   Naproxen Sodium Rash, Other (See Comments)   "Burns from inside out"   Codeine Rash, Other (See Comments)   Burning        Medication List     TAKE these medications    albuterol 108 (90 Base) MCG/ACT inhaler Commonly known as: VENTOLIN HFA Inhale 2 puffs into the lungs every 6 (six) hours as needed for wheezing or shortness of breath.   methocarbamol 500 MG tablet Commonly known as: Robaxin Take 1 tablet (500 mg total) by mouth 4 (four) times daily.   nortriptyline 50 MG capsule Commonly known as: PAMELOR Take 2 capsules (100 mg total) by mouth at bedtime.   omeprazole 20 MG capsule Commonly known as: PRILOSEC Take 20 mg by mouth daily as needed (acid reflux/indigestion.).   oxyCODONE-acetaminophen 7.5-325 MG tablet Commonly known as: PERCOCET Take 1 tablet by mouth 3 (three) times daily.   pantoprazole 40 MG tablet Commonly known as: PROTONIX Take 1  tablet (40 mg total) by mouth daily. What changed: when to take this   topiramate 50 MG tablet Commonly known as: TOPAMAX Take 50 mg by mouth 2 (two) times daily.   Vitamin D (Ergocalciferol) 1.25 MG (50000 UNIT) Caps capsule Commonly known as: DRISDOL Take 50,000 Units by mouth every Sunday.        Follow-up Information     Loleta Dicker, PA Follow up in 2 week(s).   Why: for incision check. This appointment should already be schedule at [facility]. Please call the office with any questions or concerns regarding appointment date or time. Contact information: [place] [address] [phone]                 Signed: Loleta Dicker 02/11/2021, 8:19 AM

## 2021-02-11 NOTE — Interval H&P Note (Signed)
History and Physical Interval Note:  02/11/2021 6:41 AM  Dustin Walker  has presented today for surgery, with the diagnosis of chronic pain g89.4.  The various methods of treatment have been discussed with the patient and family. After consideration of risks, benefits and other options for treatment, the patient has consented to  Procedure(s) with comments: REPLACEMENT PULSE GENERATOR LEFT FLANK ([employer]TRONIC) (Left) - LOCAL W/ MAC as a surgical intervention.  The patient's history has been reviewed, patient examined, no change in status, stable for surgery.  I have reviewed the patient's chart and labs.  Questions were answered to the patient's satisfaction.     [name]

## 2021-02-11 NOTE — H&P (Signed)
Dustin Walker is an 70 y.o. male.   Chief Complaint: Failing SCS Battery HPI: Dustin Walker is here for follow-up with a known spinal cord stimulator. He had a cervical spinal cord stimulator placed in 2014 for left arm pain. He states it works very well but currently the battery is not charged and he notices the symptoms do recur. It was still working last week. He had no complications with the initial placement. His device is MRI compatible. He would like to inquire about a battery replacement as his is reaching end-of-life.  Past Medical History:  Diagnosis Date   Allergic rhinitis    Arthritis    BCC (basal cell carcinoma) 04/02/2015   left upper eyelid, 06/12/15 MOHs   Chronic pain    left hand, crush injury in 2013   COPD (chronic obstructive pulmonary disease) (HCC)    GERD (gastroesophageal reflux disease)    Headache    migraines   SCC (squamous cell carcinoma) 10/29/2015   well diff, upper mid bac (CX3, 5FU)    Past Surgical History:  Procedure Laterality Date   COLONOSCOPY  2010   Dr. Gala Romney: tubular adenoma removed. was due for surveillance in 2015.   COLONOSCOPY WITH PROPOFOL N/A 02/22/2018   Procedure: COLONOSCOPY WITH PROPOFOL;  Surgeon: Daneil Dolin, MD;  [facility]: [facility];  Service: Endoscopy;  Laterality: N/A;  12:00pm   HAND SURGERY Right    middle finger   I & D EXTREMITY  06/19/2011   Procedure: IRRIGATION AND DEBRIDEMENT EXTREMITY;  Surgeon: Dustin Bible, MD;  Location: [facility];  Service: Plastics;;   JOINT REPLACEMENT     KNEE SURGERY Bilateral    6 times per knee and total knee replacement on the left   ORIF ULNAR FRACTURE  06/19/2011   Procedure: OPEN REDUCTION INTERNAL FIXATION (ORIF) ULNAR FRACTURE;  Surgeon: Dustin Bible, MD;  Location: [facility];  Service: Plastics;  Laterality: Left;   SHOULDER SURGERY Right     Family History  Problem Relation Age of Onset   Colon cancer Neg Hx    Social History:  reports that he has been smoking  cigarettes. He started smoking about 58 years ago. He has a 49.00 pack-year smoking history. He has never used smokeless tobacco. He reports that he does not currently use alcohol. He reports that he does not use drugs.  Allergies:  Allergies  Allergen Reactions   Naproxen Sodium Rash and Other (See Comments)    "Burns from inside out"   Codeine Rash and Other (See Comments)    Burning    Medications Prior to Admission  Medication Sig Dispense Refill   albuterol (PROVENTIL HFA;VENTOLIN HFA) 108 (90 Base) MCG/ACT inhaler Inhale 2 puffs into the lungs every 6 (six) hours as needed for wheezing or shortness of breath. 1 Inhaler 2   nortriptyline (PAMELOR) 50 MG capsule Take 2 capsules (100 mg total) by mouth at bedtime. 60 capsule 5   omeprazole (PRILOSEC) 20 MG capsule Take 20 mg by mouth daily as needed (acid reflux/indigestion.).     oxyCODONE-acetaminophen (PERCOCET) 7.5-325 MG tablet Take 1 tablet by mouth 3 (three) times daily.     pantoprazole (PROTONIX) 40 MG tablet Take 1 tablet (40 mg total) by mouth daily. (Patient taking differently: Take 40 mg by mouth every morning.) 90 tablet 0   topiramate (TOPAMAX) 50 MG tablet Take 50 mg by mouth 2 (two) times daily.     Vitamin D, Ergocalciferol, (DRISDOL) 1.25 MG (50000 UNIT)  CAPS capsule Take 50,000 Units by mouth every Sunday.      No results found for this or any previous visit (from the past 48 hour(s)). No results found.  Review of Systems General ROS: Negative Psychological ROS: Negative Ophthalmic ROS: Negative ENT ROS: Negative Hematological and Lymphatic ROS: Negative  Endocrine ROS: Negative Respiratory ROS: Negative Cardiovascular ROS: Negative Gastrointestinal ROS: Negative Genito-Urinary ROS: Negative Musculoskeletal ROS: Negative Neurological ROS: Positive left arm pain and numbness Dermatological ROS: Negative Blood pressure 98/77, pulse 78, temperature 98.6 F (37 C), temperature source Oral, resp. rate 18,  height 5\' 8"  (1.727 m), weight 54 kg, SpO2 98 %. Physical Exam  General appearance: Alert, cooperative, in no acute distress Head: Normocephalic, atraumatic Eyes: Normal, EOM intact Oropharynx: Wearing facemask Chest: Well-healed lateral thoracic incision on the left Ext: No edema in extremities  Neurologic exam:  Mental status: alertness: alert, affect: normal Speech: fluent and clear Motor: Grossly symmetric strength throughout Gait: normal   Assessment/Plan Proceed with pulse generator replacement  [name], MD 02/11/2021, 6:39 AM

## 2021-09-03 ENCOUNTER — Other Ambulatory Visit (HOSPITAL_COMMUNITY): Payer: Self-pay | Admitting: Nephrology

## 2021-09-03 ENCOUNTER — Other Ambulatory Visit: Payer: Self-pay | Admitting: Nephrology

## 2021-09-03 DIAGNOSIS — N1832 Chronic kidney disease, stage 3b: Secondary | ICD-10-CM

## 2021-09-10 ENCOUNTER — Ambulatory Visit (HOSPITAL_COMMUNITY)
Admission: RE | Admit: 2021-09-10 | Discharge: 2021-09-10 | Disposition: A | Discharge status: Home / Self Care | Payer: Medicare Other | Source: Ambulatory Visit | Attending: Nephrology | Admitting: Nephrology

## 2021-09-10 DIAGNOSIS — N1832 Chronic kidney disease, stage 3b: Secondary | ICD-10-CM | POA: Insufficient documentation

## 2021-09-20 ENCOUNTER — Ambulatory Visit: Payer: Medicare Other | Admitting: Urology

## 2021-09-20 ENCOUNTER — Encounter: Payer: Self-pay | Admitting: Urology

## 2021-09-20 VITALS — BP 95/58 | HR 80

## 2021-09-20 DIAGNOSIS — N1330 Unspecified hydronephrosis: Secondary | ICD-10-CM

## 2021-09-20 NOTE — Patient Instructions (Signed)
Hydronephrosis  Hydronephrosis is the swelling of one or both kidneys due to a blockage that stops urine from flowing out of the body. Kidneys filter waste from the blood and produce urine. This condition can lead to kidney failure and may become life-threatening if not treated promptly. What are the causes? In infants and children, common causes include problems that occur when a baby is developing in the womb. These can include problems in the kidneys or in the tubes that drain urine into the bladder (ureters). In adults, common causes include: Kidney stones. Pregnancy. A tumor or cyst in the abdomen or pelvis. An enlarged prostate gland. Other causes include: Bladder infection. Scar tissue from a previous surgery or injury. A blood clot. Cancer of the prostate, bladder, uterus, ovary, or colon. What are the signs or symptoms? Symptoms of this condition include: Pain or discomfort in your side (flank) or abdomen. Swelling in your abdomen. Nausea and vomiting. Fever. Pain when passing urine. Feelings of urgency when you need to urinate. Urinating more often than normal. In some cases, you may not have any symptoms. How is this diagnosed? This condition may be diagnosed based on: Your symptoms and medical history. A physical exam. Blood and urine tests. Imaging tests, such as an ultrasound, CT scan, or MRI. A procedure to look at your urinary tract and bladder by inserting a scope into the urethra (cystoscopy). How is this treated? Treatment for this condition depends on where the blockage is, how long it has been there, and what caused it. The goal of treatment is to remove the blockage. Treatment may include: Antibiotic medicines to treat or prevent infection. A procedure to place a small, thin tube (stent) into a blocked ureter. The stent will keep the ureter open so that urine can drain through it. A nonsurgical procedure that crushes kidney stones with shock waves  (extracorporeal shock wave lithotripsy). If kidney failure occurs, treatment may include dialysis or a kidney transplant. Follow these instructions at home:  Take over-the-counter and prescription medicines only as told by your health care provider. If you were prescribed an antibiotic medicine, take it exactly as told by your health care provider. Do not stop taking the antibiotic even if you start to feel better. Rest and return to your normal activities as told by your health care provider. Ask your health care provider what activities are safe for you. Drink enough fluid to keep your urine pale yellow. Keep all follow-up visits. This is important. Contact a health care provider if: You continue to have symptoms after treatment. You develop new symptoms. Your urine becomes cloudy or bloody. You have a fever. Get help right away if: You have severe flank or abdominal pain. You cannot drink fluids without vomiting. Summary Hydronephrosis is the swelling of one or both kidneys due to a blockage that stops urine from flowing out of the body. Hydronephrosis can lead to kidney failure and may become life-threatening if not treated promptly. The goal of treatment is to remove the blockage. It may include a procedure to insert a stent into a blocked ureter, a procedure to break up kidney stones, or taking antibiotic medicines. Follow your health care provider's instructions for taking care of yourself at home, including instructions about drinking fluids, taking medicines, and limiting activities. This information is not intended to replace advice given to you by your health care provider. Make sure you discuss any questions you have with your health care provider. Document Revised: 08/16/2019 Document Reviewed: 08/16/2019 Elsevier   Patient Education  2023 [employer]  

## 2021-09-20 NOTE — Progress Notes (Signed)
CT scheduled for patient May 15th at [facility]. Patient to arrive at [facility] 2:15 ? ?Left message to return call to office to inform patient of CT>  ?

## 2021-09-20 NOTE — Progress Notes (Signed)
? ?09/20/2021 ?12:43 PM  ? ?Dustin Walker ?[birth year] ?[id number] ? ?Referring provider: Donato Heinz, MD ?[address] ?[place],  [place] [address] ? ?Right hydronephrosis ? ? ?HPI: ?Dustin Walker is a 71yo here for evaluation of right hydronephrosis. Starting 6 months ago he developed worsenign LUTS and then associated mild right flank pain. He underwent renal US 09/10/2021 which showed new right hydronephrosis. He is followed by [facility] for CKD. No hx of nephrolithiasis. He has a 60pk year smoking history. No gross hematuria.  ? ? ?PMH: ?Past Medical History:  ?Diagnosis Date  ? Allergic rhinitis   ? Arthritis   ? BCC (basal cell carcinoma) 04/02/2015  ? left upper eyelid, 06/12/15 MOHs  ? Chronic pain   ? left hand, crush injury in 2013  ? COPD (chronic obstructive pulmonary disease) ([facility] [place])   ? GERD (gastroesophageal reflux disease)   ? Headache   ? migraines  ? SCC (squamous cell carcinoma) 10/29/2015  ? well diff, upper mid bac (CX3, 5FU)  ? ? ?Surgical History: ?Past Surgical History:  ?Procedure Laterality Date  ? COLONOSCOPY  2010  ? Dr. Gala Romney: tubular adenoma removed. was due for surveillance in 2015.  ? COLONOSCOPY WITH PROPOFOL N/A 02/22/2018  ? Procedure: COLONOSCOPY WITH PROPOFOL;  Surgeon: Daneil Dolin, MD;  [facility];  Service: Endoscopy;  Laterality: N/A;  12:00pm  ? HAND SURGERY Right   ? middle finger  ? I & D EXTREMITY  06/19/2011  ? Procedure: IRRIGATION AND DEBRIDEMENT EXTREMITY;  Surgeon: Dennie Bible, MD;  Location: [facility];  Service: Plastics;;  ? JOINT REPLACEMENT    ? KNEE SURGERY Bilateral   ? 6 times per knee and total knee replacement on the left  ? ORIF ULNAR FRACTURE  06/19/2011  ? Procedure: OPEN REDUCTION INTERNAL FIXATION (ORIF) ULNAR FRACTURE;  Surgeon: Dennie Bible, MD;  Location: [facility];  Service: Plastics;  Laterality: Left;  ? SHOULDER SURGERY Right   ? SPINAL CORD STIMULATOR BATTERY EXCHANGE Left 02/11/2021  ? Procedure: REPLACEMENT PULSE GENERATOR  LEFT FLANK (MEDTRONIC);  Surgeon: [name], MD;  Location: [facility];  Service: Neurosurgery;  Laterality: Left;  LOCAL W/ MAC  ? ? ?Home Medications:  ?Allergies as of 09/20/2021   ? ?   Reactions  ? Naproxen Sodium Rash, Other (See Comments)  ? "Burns from inside out"  ? Codeine Rash, Other (See Comments)  ? Burning  ? ?  ? ?  ?Medication List  ?  ? ?  ? Accurate as of Sep 20, 2021 12:43 PM. If you have any questions, ask your nurse or doctor.  ?  ?  ? ?  ? ?albuterol 108 (90 Base) MCG/ACT inhaler ?Commonly known as: VENTOLIN HFA ?Inhale 2 puffs into the lungs every 6 (six) hours as needed for wheezing or shortness of breath. ?  ?methocarbamol 500 MG tablet ?Commonly known as: Robaxin ?Take 1 tablet (500 mg total) by mouth 4 (four) times daily. ?  ?nortriptyline 50 MG capsule ?Commonly known as: PAMELOR ?Take 2 capsules (100 mg total) by mouth at bedtime. ?  ?omeprazole 20 MG capsule ?Commonly known as: PRILOSEC ?Take 20 mg by mouth daily as needed (acid reflux/indigestion.). ?  ?oxyCODONE-acetaminophen 7.5-325 MG tablet ?Commonly known as: PERCOCET ?Take 1 tablet by mouth 3 (three) times daily. ?  ?pantoprazole 40 MG tablet ?Commonly known as: PROTONIX ?Take 1 tablet (40 mg total) by mouth daily. ?What changed: when to take this ?  ?topiramate 50 MG tablet ?  Commonly known as: TOPAMAX ?Take 50 mg by mouth 2 (two) times daily. ?  ?Vitamin D (Ergocalciferol) 1.25 MG (50000 UNIT) Caps capsule ?Commonly known as: DRISDOL ?Take 50,000 Units by mouth every Sunday. ?  ? ?  ? ? ?Allergies:  ?Allergies  ?Allergen Reactions  ? Naproxen Sodium Rash and Other (See Comments)  ?  "Burns from inside out"  ? Codeine Rash and Other (See Comments)  ?  Burning  ? ? ?Family History: ?Family History  ?Problem Relation Age of Onset  ? Colon cancer Neg Hx   ? ? ?Social History:  reports that he has been smoking cigarettes. He started smoking about 59 years ago. He has a 49.00 pack-year smoking history. He has never used smokeless  tobacco. He reports that he does not currently use alcohol. He reports that he does not use drugs. ? ?ROS: ?All other review of systems were reviewed and are negative except what is noted above in HPI ? ?Physical Exam: ?BP (!) 95/58   Pulse 80   ?Constitutional:  Alert and oriented, No acute distress. ?HEENT: [place] AT, moist mucus membranes.  Trachea midline, no masses. ?Cardiovascular: No clubbing, cyanosis, or edema. ?Respiratory: Normal respiratory effort, no increased work of breathing. ?GI: Abdomen is soft, nontender, nondistended, no abdominal masses ?GU: No CVA tenderness.  ?Lymph: No cervical or inguinal lymphadenopathy. ?Skin: No rashes, bruises or suspicious lesions. ?Neurologic: Grossly intact, no focal deficits, moving all 4 extremities. ?Psychiatric: Normal mood and affect. ? ?Laboratory Data: ?Lab Results  ?Component Value Date  ? WBC 6.3 02/01/2021  ? HGB 13.8 02/01/2021  ? HCT 41.0 02/01/2021  ? MCV 94.7 02/01/2021  ? PLT 204 02/01/2021  ? ? ?Lab Results  ?Component Value Date  ? CREATININE 2.03 (H) 02/01/2021  ? ? ?No results found for: PSA ? ?No results found for: TESTOSTERONE ? ?No results found for: HGBA1C ? ?Urinalysis ?   ?Component Value Date/Time  ? COLORURINE YELLOW 06/26/2010 1000  ? APPEARANCEUR CLEAR 06/26/2010 1000  ? LABSPEC 1.007 06/26/2010 1000  ? PHURINE 6.5 06/26/2010 1000  ? HGBUR NEGATIVE 06/26/2010 1000  ? [place] NEGATIVE 06/26/2010 1000  ? [place] NEGATIVE 06/26/2010 1000  ? PROTEINUR NEGATIVE 06/26/2010 1000  ? UROBILINOGEN 0.2 06/26/2010 1000  ? NITRITE NEGATIVE 06/26/2010 1000  ? LEUKOCYTESUR  06/26/2010 1000  ?  NEGATIVE MICROSCOPIC NOT DONE ON URINES WITH NEGATIVE PROTEIN, BLOOD, LEUKOCYTES, NITRITE, OR GLUCOSE <1000 mg/dL.  ? ? ?No results found for: LABMICR, [place], RBCUA, LABEPIT, MUCUS, BACTERIA ? ?Pertinent Imaging: ?Renal US 09/10/2021: Images reviewed reviewed and discussed with the patient  ?No results found for this or any previous visit. ? ?No results found for  this or any previous visit. ? ?No results found for this or any previous visit. ? ?No results found for this or any previous visit. ? ?Results for orders placed during the hospital encounter of 09/10/21 ? ?US RENAL ? ?Narrative ?CLINICAL DATA:  Chronic renal disease. ? ?EXAM: ?RENAL / URINARY TRACT ULTRASOUND COMPLETE ? ?COMPARISON:  Chest CT 12/02/2018 ? ?FINDINGS: ?Right Kidney: ? ?Renal measurements: 8.6 x 4.7 x 4.5 cm = volume: 96.4 mL. Normal ?renal cortical thickness and echogenicity. Moderate right ?hydronephrosis. No focal lesion. ? ?Left Kidney: ? ?Renal measurements: 9.4 x 6.0 x 4.5 cm = volume: 131.4 mL. ?Echogenicity within normal limits. No mass or hydronephrosis ?visualized. ? ?Bladder: ? ?Appears normal for degree of bladder distention. ? ?Other: ? ?Prevoid: 249.2 cc ? ?Postvoid: 47.7 cc ? ?IMPRESSION: ?Moderate right hydronephrosis which  persists postvoid. ? ? ?Electronically Signed ?By: Lovey Newcomer M.D. ?On: 09/10/2021 16:48 ? ?No results found for this or any previous visit. ? ?No results found for this or any previous visit. ? ?No results found for this or any previous visit. ? ? ?Assessment & Plan:   ? ?1. Hydronephrosis, unspecified hydronephrosis type ?-CT stone study, RTC 1 week ?- Urinalysis, Routine w reflex microscopic ? ? ?No follow-ups on file. ? ?[name], MD ? ?[facility] [place] ?  ?

## 2021-09-23 ENCOUNTER — Ambulatory Visit (HOSPITAL_COMMUNITY)
Admission: RE | Admit: 2021-09-23 | Discharge: 2021-09-23 | Disposition: A | Discharge status: Home / Self Care | Payer: Medicare Other | Source: Ambulatory Visit | Attending: Urology | Admitting: Urology

## 2021-09-23 DIAGNOSIS — N1330 Unspecified hydronephrosis: Secondary | ICD-10-CM | POA: Diagnosis present

## 2021-10-10 ENCOUNTER — Telehealth: Payer: Self-pay

## 2021-10-10 MED ORDER — TAMSULOSIN HCL 0.4 MG PO CAPS: 0.4000 mg | ORAL_CAPSULE | Freq: Every day | ORAL | 2 refills | Status: DC

## 2021-10-10 NOTE — Telephone Encounter (Signed)
Per Dr. Alyson Ingles- he has a 84m stone causing the hydronephrosis. please start flomax 0.'4mg'$  daily. I will see him next week  Patient called and notified. Appt scheduled with patient and medication sent to pharmacy.

## 2021-10-10 NOTE — Telephone Encounter (Signed)
-----   Message from Audie Box, [place] sent at 09/25/2021 11:53 AM EDT ----- Please review.

## 2021-10-15 ENCOUNTER — Ambulatory Visit: Payer: Medicare Other | Admitting: Urology

## 2021-10-18 ENCOUNTER — Ambulatory Visit: Payer: Medicare Other | Admitting: Urology

## 2021-10-18 ENCOUNTER — Ambulatory Visit (HOSPITAL_COMMUNITY)
Admission: RE | Admit: 2021-10-18 | Discharge: 2021-10-18 | Disposition: A | Discharge status: Home / Self Care | Payer: Medicare Other | Source: Ambulatory Visit | Attending: Urology | Admitting: Urology

## 2021-10-18 ENCOUNTER — Encounter: Payer: Self-pay | Admitting: Urology

## 2021-10-18 VITALS — BP 101/66 | HR 69

## 2021-10-18 DIAGNOSIS — N1330 Unspecified hydronephrosis: Secondary | ICD-10-CM | POA: Diagnosis not present

## 2021-10-18 DIAGNOSIS — N210 Calculus in bladder: Secondary | ICD-10-CM | POA: Diagnosis not present

## 2021-10-18 DIAGNOSIS — N200 Calculus of kidney: Secondary | ICD-10-CM | POA: Insufficient documentation

## 2021-10-18 NOTE — Patient Instructions (Signed)
Dietary Guidelines to Help Prevent Kidney Stones Kidney stones are deposits of minerals and salts that form inside your kidneys. Your risk of developing kidney stones may be greater depending on your diet, your lifestyle, the medicines you take, and whether you have certain medical conditions. Most people can lower their chances of developing kidney stones by following the instructions below. Your dietitian may give you more specific instructions depending on your overall health and the type of kidney stones you tend to develop. What are tips for following this plan? Reading food labels  Choose foods with "no salt added" or "low-salt" labels. Limit your salt (sodium) intake to less than 1,500 mg a day. Choose foods with calcium for each meal and snack. Try to eat about 300 mg of calcium at each meal. Foods that contain 200-500 mg of calcium a serving include: 8 oz (237 mL) of milk, calcium-fortifiednon-dairy milk, and calcium-fortifiedfruit juice. Calcium-fortified means that calcium has been added to these drinks. 8 oz (237 mL) of kefir, yogurt, and soy yogurt. 4 oz (114 g) of tofu. 1 oz (28 g) of cheese. 1 cup (150 g) of dried figs. 1 cup (91 g) of cooked broccoli. One 3 oz (85 g) can of sardines or mackerel. Most people need 1,000-1,500 mg of calcium a day. Talk to your dietitian about how much calcium is recommended for you. Shopping Buy plenty of fresh fruits and vegetables. Most people do not need to avoid fruits and vegetables, even if these foods contain nutrients that may contribute to kidney stones. When shopping for convenience foods, choose: Whole pieces of fruit. Pre-made salads with dressing on the side. Low-fat fruit and yogurt smoothies. Avoid buying frozen meals or prepared deli foods. These can be high in sodium. Look for foods with live cultures, such as yogurt and kefir. Choose high-fiber grains, such as whole-wheat breads, oat bran, and wheat cereals. Cooking Do not add  salt to food when cooking. Place a salt shaker on the table and allow each person to add his or her own salt to taste. Use vegetable protein, such as beans, textured vegetable protein (TVP), or tofu, instead of meat in pasta, casseroles, and soups. Meal planning Eat less salt, if told by your dietitian. To do this: Avoid eating processed or pre-made food. Avoid eating fast food. Eat less animal protein, including cheese, meat, poultry, or fish, if told by your dietitian. To do this: Limit the number of times you have meat, poultry, fish, or cheese each week. Eat a diet free of meat at least 2 days a week. Eat only one serving each day of meat, poultry, fish, or seafood. When you prepare animal protein, cut pieces into small portion sizes. For most meat and fish, one serving is about the size of the palm of your hand. Eat at least five servings of fresh fruits and vegetables each day. To do this: Keep fruits and vegetables on hand for snacks. Eat one piece of fruit or a handful of berries with breakfast. Have a salad and fruit at lunch. Have two kinds of vegetables at dinner. Limit foods that are high in a substance called oxalate. These include: Spinach (cooked), rhubarb, beets, sweet potatoes, and Swiss chard. Peanuts. Potato chips, french fries, and baked potatoes with skin on. Nuts and nut products. Chocolate. If you regularly take a diuretic medicine, make sure to eat at least 1 or 2 servings of fruits or vegetables that are high in potassium each day. These include: Avocado. Banana. Orange, prune,   carrot, or tomato juice. Baked potato. Cabbage. Beans and split peas. Lifestyle  Drink enough fluid to keep your urine pale yellow. This is the most important thing you can do. Spread your fluid intake throughout the day. If you drink alcohol: Limit how much you use to: 0-1 drink a day for women who are not pregnant. 0-2 drinks a day for men. Be aware of how much alcohol is in your  drink. In the [place] one drink equals one 12 oz bottle of beer (355 mL), one 5 oz glass of wine (148 mL), or one 1 oz glass of hard liquor (44 mL). Lose weight if told by your health care provider. Work with your dietitian to find an eating plan and weight loss strategies that work best for you. General information Talk to your health care provider and dietitian about taking daily supplements. You may be told the following depending on your health and the cause of your kidney stones: Not to take supplements with vitamin C. To take a calcium supplement. To take a daily probiotic supplement. To take other supplements such as magnesium, fish oil, or vitamin B6. Take over-the-counter and prescription medicines only as told by your health care provider. These include supplements. What foods should I limit? Limit your intake of the following foods, or eat them as told by your dietitian. Vegetables Spinach. Rhubarb. Beets. Canned vegetables. Pickles. Olives. Baked potatoes with skin. Grains Wheat bran. Baked goods. Salted crackers. Cereals high in sugar. Meats and other proteins Nuts. Nut butters. Large portions of meat, poultry, or fish. Salted, precooked, or cured meats, such as sausages, meat loaves, and hot dogs. Dairy Cheese. Beverages Regular soft drinks. Regular vegetable juice. Seasonings and condiments Seasoning blends with salt. Salad dressings. Soy sauce. Ketchup. Barbecue sauce. Other foods Canned soups. Canned pasta sauce. Casseroles. Pizza. Lasagna. Frozen meals. Potato chips. French fries. The items listed above may not be a complete list of foods and beverages you should limit. Contact a dietitian for more information. What foods should I avoid? Talk to your dietitian about specific foods you should avoid based on the type of kidney stones you have and your overall health. Fruits Grapefruit. The item listed above may not be a complete list of foods and beverages you should  avoid. Contact a dietitian for more information. Summary Kidney stones are deposits of minerals and salts that form inside your kidneys. You can lower your risk of kidney stones by making changes to your diet. The most important thing you can do is drink enough fluid. Drink enough fluid to keep your urine pale yellow. Talk to your dietitian about how much calcium you should have each day, and eat less salt and animal protein as told by your dietitian. This information is not intended to replace advice given to you by your health care provider. Make sure you discuss any questions you have with your health care provider. Document Revised: 01/07/2021 Document Reviewed: 01/07/2021 Elsevier Patient Education  2023 Elsevier Inc.  

## 2021-10-18 NOTE — Progress Notes (Signed)
10/18/2021 9:40 AM   Dustin Walker 1950/10/18 [id number]  Referring provider: Carylon Perches, NP [place],  [place] [address]  Followup nephrolithiasis   HPI: Mr Flamenco is a 71yo here for followup for nephrolithiasis. He does not know if he passed his calculus. No right flank pain. No significant LUTS. KUB from today shows no definitive calculus.    PMH: Past Medical History:  Diagnosis Date   Allergic rhinitis    Arthritis    BCC (basal cell carcinoma) 04/02/2015   left upper eyelid, 06/12/15 MOHs   Chronic pain    left hand, crush injury in 2013   COPD (chronic obstructive pulmonary disease) ([facility])    GERD (gastroesophageal reflux disease)    Headache    migraines   SCC (squamous cell carcinoma) 10/29/2015   well diff, upper mid bac (CX3, 5FU)    Surgical History: Past Surgical History:  Procedure Laterality Date   COLONOSCOPY  2010   Dr. Gala Romney: tubular adenoma removed. was due for surveillance in 2015.   COLONOSCOPY WITH PROPOFOL N/A 02/22/2018   Procedure: COLONOSCOPY WITH PROPOFOL;  Surgeon: Daneil Dolin, MD;  [facility];  Service: Endoscopy;  Laterality: N/A;  12:00pm   HAND SURGERY Right    middle finger   I & D EXTREMITY  06/19/2011   Procedure: IRRIGATION AND DEBRIDEMENT EXTREMITY;  Surgeon: Dennie Bible, MD;  Location: [facility];  Service: Plastics;;   JOINT REPLACEMENT     KNEE SURGERY Bilateral    6 times per knee and total knee replacement on the left   ORIF ULNAR FRACTURE  06/19/2011   Procedure: OPEN REDUCTION INTERNAL FIXATION (ORIF) ULNAR FRACTURE;  Surgeon: Dennie Bible, MD;  Location: [facility];  Service: Plastics;  Laterality: Left;   SHOULDER SURGERY Right    SPINAL CORD STIMULATOR BATTERY EXCHANGE Left 02/11/2021   Procedure: REPLACEMENT PULSE GENERATOR LEFT FLANK (MEDTRONIC);  Surgeon: [name], MD;  Location: [facility];  Service: Neurosurgery;  Laterality: Left;  LOCAL W/ MAC    Home Medications:   Allergies as of 10/18/2021       Reactions   Naproxen Sodium Rash, Other (See Comments)   "Burns from inside out"   Codeine Rash, Other (See Comments)   Burning        Medication List        Accurate as of October 18, 2021  9:40 AM. If you have any questions, ask your nurse or doctor.          STOP taking these medications    methocarbamol 500 MG tablet Commonly known as: Robaxin   omeprazole 20 MG capsule Commonly known as: PRILOSEC   Vitamin D (Ergocalciferol) 1.25 MG (50000 UNIT) Caps capsule Commonly known as: DRISDOL       TAKE these medications    albuterol 108 (90 Base) MCG/ACT inhaler Commonly known as: VENTOLIN HFA Inhale 2 puffs into the lungs every 6 (six) hours as needed for wheezing or shortness of breath.   nortriptyline 50 MG capsule Commonly known as: PAMELOR Take 2 capsules (100 mg total) by mouth at bedtime.   Nurtec 75 MG Tbdp Generic drug: Rimege[place]ant Sulfate Take 1 tablet by mouth daily as needed.   oxyCODONE-acetaminophen 7.5-325 MG tablet Commonly known as: PERCOCET Take 1 tablet by mouth 3 (three) times daily.   pantoprazole 40 MG tablet Commonly known as: PROTONIX Take 1 tablet (40 mg total) by mouth daily. What changed: when to take this  tamsulosin 0.4 MG Caps capsule Commonly known as: FLOMAX Take 1 capsule (0.4 mg total) by mouth daily.   topiramate 50 MG tablet Commonly known as: TOPAMAX Take 50 mg by mouth 2 (two) times daily.        Allergies:  Allergies  Allergen Reactions   Naproxen Sodium Rash and Other (See Comments)    "Burns from inside out"   Codeine Rash and Other (See Comments)    Burning    Family History: Family History  Problem Relation Age of Onset   Colon cancer Neg Hx     Social History:  reports that he has been smoking cigarettes. He started smoking about 59 years ago. He has a 49.00 pack-year smoking history. He has never used smokeless tobacco. He reports that he does not currently use  alcohol. He reports that he does not use drugs.  ROS: All other review of systems were reviewed and are negative except what is noted above in HPI  Physical Exam: BP 101/66   Pulse 69   Constitutional:  Alert and oriented, No acute distress. HEENT: [place] AT, moist mucus membranes.  Trachea midline, no masses. Cardiovascular: No clubbing, cyanosis, or edema. Respiratory: Normal respiratory effort, no increased work of breathing. GI: Abdomen is soft, nontender, nondistended, no abdominal masses GU: No CVA tenderness.  Lymph: No cervical or inguinal lymphadenopathy. Skin: No rashes, bruises or suspicious lesions. Neurologic: Grossly intact, no focal deficits, moving all 4 extremities. Psychiatric: Normal mood and affect.  Laboratory Data: Lab Results  Component Value Date   WBC 6.3 02/01/2021   HGB 13.8 02/01/2021   HCT 41.0 02/01/2021   MCV 94.7 02/01/2021   PLT 204 02/01/2021    Lab Results  Component Value Date   CREATININE 2.03 (H) 02/01/2021    No results found for: "PSA"  No results found for: "TESTOSTERONE"  No results found for: "HGBA1C"  Urinalysis    Component Value Date/Time   COLORURINE YELLOW 06/26/2010 1000   APPEARANCEUR CLEAR 06/26/2010 1000   LABSPEC 1.007 06/26/2010 1000   PHURINE 6.5 06/26/2010 1000   HGBUR NEGATIVE 06/26/2010 1000   BILIRUBINUR NEGATIVE 06/26/2010 1000   KETONESUR NEGATIVE 06/26/2010 1000   PROTEINUR NEGATIVE 06/26/2010 1000   UROBILINOGEN 0.2 06/26/2010 1000   NITRITE NEGATIVE 06/26/2010 1000   LEUKOCYTESUR  06/26/2010 1000    NEGATIVE MICROSCOPIC NOT DONE ON URINES WITH NEGATIVE PROTEIN, BLOOD, LEUKOCYTES, NITRITE, OR GLUCOSE <1000 mg/dL.    No results found for: "LABMICR", "WBCUA", "RBCUA", "LABEPIT", "MUCUS", "BACTERIA"  Pertinent Imaging: KUb today: Images reviewed and discussed with the patient No results found for this or any previous visit.  No results found for this or any previous visit.  No results found for  this or any previous visit.  No results found for this or any previous visit.  Results for orders placed during the hospital encounter of 09/10/21  US RENAL  Narrative CLINICAL DATA:  Chronic renal disease.  EXAM: RENAL / URINARY TRACT ULTRASOUND COMPLETE  COMPARISON:  Chest CT 12/02/2018  FINDINGS: Right Kidney:  Renal measurements: 8.6 x 4.7 x 4.5 cm = volume: 96.4 mL. Normal renal cortical thickness and echogenicity. Moderate right hydronephrosis. No focal lesion.  Left Kidney:  Renal measurements: 9.4 x 6.0 x 4.5 cm = volume: 131.4 mL. Echogenicity within normal limits. No mass or hydronephrosis visualized.  Bladder:  Appears normal for degree of bladder distention.  Other:  Prevoid: 249.2 cc  Postvoid: 47.7 cc  IMPRESSION: Moderate right hydronephrosis which persists postvoid.  Electronically Signed By: Lovey Newcomer M.D. On: 09/10/2021 16:48  No results found for this or any previous visit.  No results found for this or any previous visit.  Results for orders placed during the hospital encounter of 09/23/21  CT RENAL STONE STUDY  Narrative CLINICAL DATA:  Right flank pain. Right hydronephrosis. Chronic kidney disease.  EXAM: CT ABDOMEN AND PELVIS WITHOUT CONTRAST  TECHNIQUE: Multidetector CT imaging of the abdomen and pelvis was performed following the standard protocol without IV contrast.  RADIATION DOSE REDUCTION: This exam was performed according to the departmental dose-optimization program which includes automated exposure control, adjustment of the mA and/or kV according to patient size and/or use of iterative reconstruction technique.  COMPARISON:  Ultrasound on 09/10/2021  FINDINGS: Lower chest: No acute findings. 6 mm pulmonary nodule in the lateral right lower lobe remains stable since earlier chest CT in 2020, consistent with benign etiology. Emphysema again noted in both lung bases.  Hepatobiliary: No mass visualized on  this unenhanced exam. Gallbladder is unremarkable. No evidence of biliary ductal dilatation.  Pancreas: No mass or inflammatory process visualized on this unenhanced exam.  Spleen:  Within normal limits in size.  Adrenals/Urinary tract: Moderate diffuse right renal atrophy is seen. Moderate right hydroureteronephrosis is seen, with a 2 mm calculus in the region of right UVJ. Another small calculus is seen in the bladder in the region of the left UVJ, however there is no evidence of left-sided hydroureteronephrosis.  Stomach/Bowel: No evidence of obstruction, inflammatory process, or abnormal fluid collections.  Vascular/Lymphatic: No pathologically enlarged lymph nodes identified. No evidence of abdominal aortic aneurysm. Aortic atherosclerotic calcification incidentally noted.  Reproductive:  Mildly enlarged prostate.  Other:  None.  Musculoskeletal:  No suspicious bone lesions identified.  IMPRESSION: Moderate right hydroureteronephrosis, with 2 mm calculus in bladder in region of right UVJ.  Moderate diffuse right renal atrophy.  Small bladder calculus in region of left UVJ, but no left-sided hydronephrosis  Mildly enlarged prostate.   Electronically Signed By: Marlaine Hind M.D. On: 09/24/2021 20:30   Assessment & Plan:    1. Hydronephrosis, unspecified hydronephrosis type -RTC 4 weeks with a renal US - Urinalysis, Routine w reflex microscopic   No follow-ups on file.  [name], MD  [facility] [place]

## 2021-11-18 ENCOUNTER — Ambulatory Visit: Payer: Medicare Other | Admitting: Urology

## 2021-11-18 DIAGNOSIS — N200 Calculus of kidney: Secondary | ICD-10-CM

## 2021-11-18 DIAGNOSIS — N1330 Unspecified hydronephrosis: Secondary | ICD-10-CM

## 2021-11-27 ENCOUNTER — Encounter: Payer: Self-pay | Admitting: Neurology

## 2021-11-27 ENCOUNTER — Ambulatory Visit (HOSPITAL_COMMUNITY)
Admission: RE | Admit: 2021-11-27 | Discharge: 2021-11-27 | Disposition: A | Discharge status: Home / Self Care | Payer: Medicare Other | Source: Ambulatory Visit | Attending: Urology | Admitting: Urology

## 2021-11-27 DIAGNOSIS — N200 Calculus of kidney: Secondary | ICD-10-CM | POA: Diagnosis present

## 2021-11-27 DIAGNOSIS — N1330 Unspecified hydronephrosis: Secondary | ICD-10-CM | POA: Diagnosis present

## 2021-11-29 ENCOUNTER — Encounter: Payer: Self-pay | Admitting: Urology

## 2021-11-29 ENCOUNTER — Other Ambulatory Visit: Payer: Self-pay | Admitting: Urology

## 2021-11-29 ENCOUNTER — Ambulatory Visit (INDEPENDENT_AMBULATORY_CARE_PROVIDER_SITE_OTHER): Payer: Medicare Other | Admitting: Urology

## 2021-11-29 VITALS — BP 92/55 | HR 71

## 2021-11-29 DIAGNOSIS — N1330 Unspecified hydronephrosis: Secondary | ICD-10-CM | POA: Diagnosis not present

## 2021-11-29 DIAGNOSIS — N200 Calculus of kidney: Secondary | ICD-10-CM

## 2021-11-29 NOTE — Progress Notes (Signed)
11/29/2021 11:32 AM   Dustin Walker January 14, 1951 [id number]  Referring provider: Carylon Perches, NP [place],  [place] [address]  Followup hydronephrosis and nephrolithiasis   HPI: [name] is a 71yo here for followup for nephrolithiasis and right hydronephrosis. Renal US 11/25/2021 shows severe right hydronephrosis with cortical thinning. He denies any flank pain. No significant LUTS.    PMH: Past Medical History:  Diagnosis Date   Allergic rhinitis    Arthritis    BCC (basal cell carcinoma) 04/02/2015   left upper eyelid, 06/12/15 MOHs   Chronic pain    left hand, crush injury in 2013   COPD (chronic obstructive pulmonary disease) (HCC)    GERD (gastroesophageal reflux disease)    Headache    migraines   SCC (squamous cell carcinoma) 10/29/2015   well diff, upper mid bac (CX3, 5FU)    Surgical History: Past Surgical History:  Procedure Laterality Date   COLONOSCOPY  2010   Dr. Gala Romney: tubular adenoma removed. was due for surveillance in 2015.   COLONOSCOPY WITH PROPOFOL N/A 02/22/2018   Procedure: COLONOSCOPY WITH PROPOFOL;  Surgeon: Daneil Dolin, MD;  [facility];  Service: Endoscopy;  Laterality: N/A;  12:00pm   HAND SURGERY Right    middle finger   I & D EXTREMITY  06/19/2011   Procedure: IRRIGATION AND DEBRIDEMENT EXTREMITY;  Surgeon: Dennie Bible, MD;  Location: [facility];  Service: Plastics;;   JOINT REPLACEMENT     KNEE SURGERY Bilateral    6 times per knee and total knee replacement on the left   ORIF ULNAR FRACTURE  06/19/2011   Procedure: OPEN REDUCTION INTERNAL FIXATION (ORIF) ULNAR FRACTURE;  Surgeon: Dennie Bible, MD;  Location: [facility];  Service: Plastics;  Laterality: Left;   SHOULDER SURGERY Right    SPINAL CORD STIMULATOR BATTERY EXCHANGE Left 02/11/2021   Procedure: REPLACEMENT PULSE GENERATOR LEFT FLANK (MEDTRONIC);  Surgeon: [name], MD;  Location: [facility];  Service: Neurosurgery;  Laterality: Left;  LOCAL  W/ MAC    Home Medications:  Allergies as of 11/29/2021       Reactions   Naproxen Sodium Rash, Other (See Comments)   "Burns from inside out"   Codeine Rash, Other (See Comments)   Burning        Medication List        Accurate as of November 29, 2021 11:32 AM. If you have any questions, ask your nurse or doctor.          albuterol 108 (90 Base) MCG/ACT inhaler Commonly known as: VENTOLIN HFA Inhale 2 puffs into the lungs every 6 (six) hours as needed for wheezing or shortness of breath.   nortriptyline 50 MG capsule Commonly known as: PAMELOR Take 2 capsules (100 mg total) by mouth at bedtime.   Nurtec 75 MG Tbdp Generic drug: Rimegepant Sulfate Take 1 tablet by mouth daily as needed.   oxyCODONE-acetaminophen 7.5-325 MG tablet Commonly known as: PERCOCET Take 1 tablet by mouth 3 (three) times daily.   pantoprazole 40 MG tablet Commonly known as: PROTONIX Take 1 tablet (40 mg total) by mouth daily. What changed: when to take this   tamsulosin 0.4 MG Caps capsule Commonly known as: FLOMAX Take 1 capsule (0.4 mg total) by mouth daily.   topiramate 50 MG tablet Commonly known as: TOPAMAX Take 50 mg by mouth 2 (two) times daily.        Allergies:  Allergies  Allergen Reactions   Naproxen  Sodium Rash and Other (See Comments)    "Burns from inside out"   Codeine Rash and Other (See Comments)    Burning    Family History: Family History  Problem Relation Age of Onset   Colon cancer Neg Hx     Social History:  reports that he has been smoking cigarettes. He started smoking about 59 years ago. He has a 49.00 pack-year smoking history. He has never used smokeless tobacco. He reports that he does not currently use alcohol. He reports that he does not use drugs.  ROS: All other review of systems were reviewed and are negative except what is noted above in HPI  Physical Exam: BP (!) 92/55   Pulse 71   Constitutional:  Alert and oriented, No acute  distress. HEENT: Tensed AT, moist mucus membranes.  Trachea midline, no masses. Cardiovascular: No clubbing, cyanosis, or edema. Respiratory: Normal respiratory effort, no increased work of breathing. GI: Abdomen is soft, nontender, nondistended, no abdominal masses GU: No CVA tenderness.  Lymph: No cervical or inguinal lymphadenopathy. Skin: No rashes, bruises or suspicious lesions. Neurologic: Grossly intact, no focal deficits, moving all 4 extremities. Psychiatric: Normal mood and affect.  Laboratory Data: Lab Results  Component Value Date   WBC 6.3 02/01/2021   HGB 13.8 02/01/2021   HCT 41.0 02/01/2021   MCV 94.7 02/01/2021   PLT 204 02/01/2021    Lab Results  Component Value Date   CREATININE 2.03 (H) 02/01/2021    No results found for: "PSA"  No results found for: "TESTOSTERONE"  No results found for: "HGBA1C"  Urinalysis    Component Value Date/Time   COLORURINE YELLOW 06/26/2010 1000   APPEARANCEUR CLEAR 06/26/2010 1000   LABSPEC 1.007 06/26/2010 1000   PHURINE 6.5 06/26/2010 1000   HGBUR NEGATIVE 06/26/2010 1000   BILIRUBINUR NEGATIVE 06/26/2010 1000   KETONESUR NEGATIVE 06/26/2010 1000   PROTEINUR NEGATIVE 06/26/2010 1000   UROBILINOGEN 0.2 06/26/2010 1000   NITRITE NEGATIVE 06/26/2010 1000   LEUKOCYTESUR  06/26/2010 1000    NEGATIVE MICROSCOPIC NOT DONE ON URINES WITH NEGATIVE PROTEIN, BLOOD, LEUKOCYTES, NITRITE, OR GLUCOSE <1000 mg/dL.    No results found for: "LABMICR", "WBCUA", "RBCUA", "LABEPIT", "MUCUS", "BACTERIA"  Pertinent Imaging: Renal US 11/25/2021: Images reviewed and discussed with the patient  Results for orders placed in visit on 10/18/21  Abdomen 1 view (KUB)  Narrative CLINICAL DATA:  Nephrolithiasis  EXAM: ABDOMEN - 1 VIEW  COMPARISON:  None Available.  FINDINGS: Nonobstructive bowel gas pattern. No free intraperitoneal gas. Large colonic stool burden. No definite nephro or urolithiasis. Mild vascular calcification noted  within the pelvis. No organomegaly. Osseous structures are unremarkable.  IMPRESSION: No definite nephro or urolithiasis.  Large colonic stool burden.   Electronically Signed By: Fidela Salisbury M.D. On: 10/19/2021 21:30  No results found for this or any previous visit.  No results found for this or any previous visit.  No results found for this or any previous visit.  Results for orders placed during the hospital encounter of 11/27/21  Ultrasound renal complete  Narrative CLINICAL DATA:  hydronephrosis  EXAM: RENAL / URINARY TRACT ULTRASOUND COMPLETE  COMPARISON:  CT dated Sep 23, 2021; ultrasound in Sep 10, 2021  FINDINGS: Right Kidney:  Renal measurements: 8.9 x 5.2 x 4.6 cm = volume: 109 mL. Echogenicity within normal limits. Persistent severe hydronephrosis with mild cortical thinning. This persists postvoid. This is not significantly changed in comparison to prior ultrasound.  Left Kidney:  Renal measurements: 10.0 x  5.7 x 4.7 cm = volume: 141 mL. Echogenicity within normal limits. No mass or hydronephrosis visualized.  Bladder:  Decompressed and not visualized.  Other:  None.  IMPRESSION: Persistent severe RIGHT-sided hydronephrosis, similar in comparison to prior ultrasound and CT. This persists postvoid. There is mild cortical thinning.   Electronically Signed By: Valentino Saxon M.D. On: 11/27/2021 17:18  No results found for this or any previous visit.  No results found for this or any previous visit.  Results for orders placed during the hospital encounter of 09/23/21  CT RENAL STONE STUDY  Narrative CLINICAL DATA:  Right flank pain. Right hydronephrosis. Chronic kidney disease.  EXAM: CT ABDOMEN AND PELVIS WITHOUT CONTRAST  TECHNIQUE: Multidetector CT imaging of the abdomen and pelvis was performed following the standard protocol without IV contrast.  RADIATION DOSE REDUCTION: This exam was performed according to  the departmental dose-optimization program which includes automated exposure control, adjustment of the mA and/or kV according to patient size and/or use of iterative reconstruction technique.  COMPARISON:  Ultrasound on 09/10/2021  FINDINGS: Lower chest: No acute findings. 6 mm pulmonary nodule in the lateral right lower lobe remains stable since earlier chest CT in 2020, consistent with benign etiology. Emphysema again noted in both lung bases.  Hepatobiliary: No mass visualized on this unenhanced exam. Gallbladder is unremarkable. No evidence of biliary ductal dilatation.  Pancreas: No mass or inflammatory process visualized on this unenhanced exam.  Spleen:  Within normal limits in size.  Adrenals/Urinary tract: Moderate diffuse right renal atrophy is seen. Moderate right hydroureteronephrosis is seen, with a 2 mm calculus in the region of right UVJ. Another small calculus is seen in the bladder in the region of the left UVJ, however there is no evidence of left-sided hydroureteronephrosis.  Stomach/Bowel: No evidence of obstruction, inflammatory process, or abnormal fluid collections.  Vascular/Lymphatic: No pathologically enlarged lymph nodes identified. No evidence of abdominal aortic aneurysm. Aortic atherosclerotic calcification incidentally noted.  Reproductive:  Mildly enlarged prostate.  Other:  None.  Musculoskeletal:  No suspicious bone lesions identified.  IMPRESSION: Moderate right hydroureteronephrosis, with 2 mm calculus in bladder in region of right UVJ.  Moderate diffuse right renal atrophy.  Small bladder calculus in region of left UVJ, but no left-sided hydronephrosis  Mildly enlarged prostate.   Electronically Signed By: Marlaine Hind M.D. On: 09/24/2021 20:30   Assessment & Plan:    1. Hydronephrosis, unspecified hydronephrosis type -Schedule for ureteroscopy  2. Nephrolithiasis -We discussed the management of kidney stones. These  options include observation, ureteroscopy, shockwave lithotripsy (ESWL) and percutaneous nephrolithotomy (PCNL). We discussed which options are relevant to the patient's stone(s). We discussed the natural history of kidney stones as well as the complications of untreated stones and the impact on quality of life without treatment as well as with each of the above listed treatments. We also discussed the efficacy of each treatment in its ability to clear the stone burden. With any of these management options I discussed the signs and symptoms of infection and the need for emergent treatment should these be experienced. For each option we discussed the ability of each procedure to clear the patient of their stone burden.   For observation I described the risks which include but are not limited to silent renal damage, life-threatening infection, need for emergent surgery, failure to pass stone and pain.   For ureteroscopy I described the risks which include bleeding, infection, damage to contiguous structures, positioning injury, ureteral stricture, ureteral avulsion, ureteral injury, need  for prolonged ureteral stent, inability to perform ureteroscopy, need for an interval procedure, inability to clear stone burden, stent discomfort/pain, heart attack, stroke, pulmonary embolus and the inherent risks with general anesthesia.   For shockwave lithotripsy I described the risks which include arrhythmia, kidney contusion, kidney hemorrhage, need for transfusion, pain, inability to adequately break up stone, inability to pass stone fragments, Steinstrasse, infection associated with obstructing stones, need for alternate surgical procedure, need for repeat shockwave lithotripsy, MI, CVA, PE and the inherent risks with anesthesia/conscious sedation.   For PCNL I described the risks including positioning injury, pneumothorax, hydrothorax, need for chest tube, inability to clear stone burden, renal laceration, arterial  venous fistula or malformation, need for embolization of kidney, loss of kidney or renal function, need for repeat procedure, need for prolonged nephrostomy tube, ureteral avulsion, MI, CVA, PE and the inherent risks of general anesthesia.   - The patient would like to proceed with right ureteroscopic stone extraction.    No follow-ups on file.  [name], MD  [facility] [place]

## 2021-11-29 NOTE — Progress Notes (Unsigned)
Surgical Physician Order Form [facility]  * Scheduling expectation : ASAP  *Length of Case: 30 minutes  *MD Preforming Case: [name], MD  *Assistant Needed: no  *Facility Preference: [facility]  *Clearance needed: no  *Anticoagulation Instructions: Hold all anticoagulants  *Aspirin Instructions: Ok to continue Aspirin  -Admit type: OUTpatient  -Anesthesia: General  -Use Standing Orders:  NA  *Diagnosis: Right Ureteral Stone  *Procedure: right Ureteroscopy w/laser lithotripsy & stent placement (99242)  Additional orders: N/A  -Equipment: Holmium laser -VTE Prophylaxis Standing Order SCD's       Other:   -Standing Lab Orders Per Anesthesia    Lab other: None  -Standing Test orders EKG/Chest x-ray per Anesthesia       Test other:   - Medications:  Ancef 2gm IV  -Other orders:  Ok to proceed with Ancef PCN allergy reviewed  *Post-op visit Date/Instructions:  1 week cysto stent removal

## 2021-11-29 NOTE — H&P (View-Only) (Signed)
 11/29/2021 11:32 AM   Dustin Walker 08/26/1950 [id number]  Referring provider: Ohenhen, Pat, NP [address] [place],  [place] [address]  Followup hydronephrosis and nephrolithiasis   HPI: Dustin Walker is a 71yo here for followup for nephrolithiasis and right hydronephrosis. Renal US 11/25/2021 shows severe right hydronephrosis with cortical thinning. He denies any flank pain. No significant LUTS.    PMH: Past Medical History:  Diagnosis Date   Allergic rhinitis    Arthritis    BCC (basal cell carcinoma) 04/02/2015   left upper eyelid, 06/12/15 MOHs   Chronic pain    left hand, crush injury in 2013   COPD (chronic obstructive pulmonary disease) (HCC)    GERD (gastroesophageal reflux disease)    Headache    migraines   SCC (squamous cell carcinoma) 10/29/2015   well diff, upper mid bac (CX3, 5FU)    Surgical History: Past Surgical History:  Procedure Laterality Date   COLONOSCOPY  2010   Dr. Rourk: tubular adenoma removed. was due for surveillance in 2015.   COLONOSCOPY WITH PROPOFOL N/A 02/22/2018   Procedure: COLONOSCOPY WITH PROPOFOL;  Surgeon: Rourk, Liliana M, MD;  Location: [facility];  Service: Endoscopy;  Laterality: N/A;  12:00pm   HAND SURGERY Right    middle finger   I & D EXTREMITY  06/19/2011   Procedure: IRRIGATION AND DEBRIDEMENT EXTREMITY;  Surgeon: Harrill C Coley, MD;  Location: [facility];  Service: Plastics;;   JOINT REPLACEMENT     KNEE SURGERY Bilateral    6 times per knee and total knee replacement on the left   ORIF ULNAR FRACTURE  06/19/2011   Procedure: OPEN REDUCTION INTERNAL FIXATION (ORIF) ULNAR FRACTURE;  Surgeon: Harrill C Coley, MD;  Location: [facility] OR;  Service: Plastics;  Laterality: Left;   SHOULDER SURGERY Right    SPINAL CORD STIMULATOR BATTERY EXCHANGE Left 02/11/2021   Procedure: REPLACEMENT PULSE GENERATOR LEFT FLANK (MEDTRONIC);  Surgeon: Cook, Steven, MD;  Location: [facility];  Service: Neurosurgery;  Laterality: Left;  LOCAL  W/ MAC    Home Medications:  Allergies as of 11/29/2021       Reactions   Naproxen Sodium Rash, Other (See Comments)   "Burns from inside out"   Codeine Rash, Other (See Comments)   Burning        Medication List        Accurate as of November 29, 2021 11:32 AM. If you have any questions, ask your nurse or doctor.          albuterol 108 (90 Base) MCG/ACT inhaler Commonly known as: VENTOLIN HFA Inhale 2 puffs into the lungs every 6 (six) hours as needed for wheezing or shortness of breath.   nortriptyline 50 MG capsule Commonly known as: PAMELOR Take 2 capsules (100 mg total) by mouth at bedtime.   Nurtec 75 MG Tbdp Generic drug: Rimegepant Sulfate Take 1 tablet by mouth daily as needed.   oxyCODONE-acetaminophen 7.5-325 MG tablet Commonly known as: PERCOCET Take 1 tablet by mouth 3 (three) times daily.   pantoprazole 40 MG tablet Commonly known as: PROTONIX Take 1 tablet (40 mg total) by mouth daily. What changed: when to take this   tamsulosin 0.4 MG Caps capsule Commonly known as: FLOMAX Take 1 capsule (0.4 mg total) by mouth daily.   topiramate 50 MG tablet Commonly known as: TOPAMAX Take 50 mg by mouth 2 (two) times daily.        Allergies:  Allergies  Allergen Reactions   Naproxen   Sodium Rash and Other (See Comments)    "Burns from inside out"   Codeine Rash and Other (See Comments)    Burning    Family History: Family History  Problem Relation Age of Onset   Colon cancer Neg Hx     Social History:  reports that he has been smoking cigarettes. He started smoking about 59 years ago. He has a 49.00 pack-year smoking history. He has never used smokeless tobacco. He reports that he does not currently use alcohol. He reports that he does not use drugs.  ROS: All other review of systems were reviewed and are negative except what is noted above in HPI  Physical Exam: BP (!) 92/55   Pulse 71   Constitutional:  Alert and oriented, No acute  distress. HEENT: [place] AT, moist mucus membranes.  Trachea midline, no masses. Cardiovascular: No clubbing, cyanosis, or edema. Respiratory: Normal respiratory effort, no increased work of breathing. GI: Abdomen is soft, nontender, nondistended, no abdominal masses GU: No CVA tenderness.  Lymph: No cervical or inguinal lymphadenopathy. Skin: No rashes, bruises or suspicious lesions. Neurologic: Grossly intact, no focal deficits, moving all 4 extremities. Psychiatric: Normal mood and affect.  Laboratory Data: Lab Results  Component Value Date   WBC 6.3 02/01/2021   HGB 13.8 02/01/2021   HCT 41.0 02/01/2021   MCV 94.7 02/01/2021   PLT 204 02/01/2021    Lab Results  Component Value Date   CREATININE 2.03 (H) 02/01/2021    No results found for: "PSA"  No results found for: "TESTOSTERONE"  No results found for: "HGBA1C"  Urinalysis    Component Value Date/Time   COLORURINE YELLOW 06/26/2010 1000   APPEARANCEUR CLEAR 06/26/2010 1000   LABSPEC 1.007 06/26/2010 1000   PHURINE 6.5 06/26/2010 1000   HGBUR NEGATIVE 06/26/2010 1000   BILIRUBINUR NEGATIVE 06/26/2010 1000   KETONESUR NEGATIVE 06/26/2010 1000   PROTEINUR NEGATIVE 06/26/2010 1000   UROBILINOGEN 0.2 06/26/2010 1000   NITRITE NEGATIVE 06/26/2010 1000   LEUKOCYTESUR  06/26/2010 1000    NEGATIVE MICROSCOPIC NOT DONE ON URINES WITH NEGATIVE PROTEIN, BLOOD, LEUKOCYTES, NITRITE, OR GLUCOSE <1000 mg/dL.    No results found for: "LABMICR", "WBCUA", "RBCUA", "LABEPIT", "MUCUS", "BACTERIA"  Pertinent Imaging: Renal US 11/25/2021: Images reviewed and discussed with the patient  Results for orders placed in visit on 10/18/21  Abdomen 1 view (KUB)  Narrative CLINICAL DATA:  Nephrolithiasis  EXAM: ABDOMEN - 1 VIEW  COMPARISON:  None Available.  FINDINGS: Nonobstructive bowel gas pattern. No free intraperitoneal gas. Large colonic stool burden. No definite nephro or urolithiasis. Mild vascular calcification noted  within the pelvis. No organomegaly. Osseous structures are unremarkable.  IMPRESSION: No definite nephro or urolithiasis.  Large colonic stool burden.   Electronically Signed By: Ashesh  Parikh M.D. On: 10/19/2021 21:30  No results found for this or any previous visit.  No results found for this or any previous visit.  No results found for this or any previous visit.  Results for orders placed during the hospital encounter of 11/27/21  Ultrasound renal complete  Narrative CLINICAL DATA:  hydronephrosis  EXAM: RENAL / URINARY TRACT ULTRASOUND COMPLETE  COMPARISON:  CT dated Sep 23, 2021; ultrasound in Sep 10, 2021  FINDINGS: Right Kidney:  Renal measurements: 8.9 x 5.2 x 4.6 cm = volume: 109 mL. Echogenicity within normal limits. Persistent severe hydronephrosis with mild cortical thinning. This persists postvoid. This is not significantly changed in comparison to prior ultrasound.  Left Kidney:  Renal measurements: 10.0 x   5.7 x 4.7 cm = volume: 141 mL. Echogenicity within normal limits. No mass or hydronephrosis visualized.  Bladder:  Decompressed and not visualized.  Other:  None.  IMPRESSION: Persistent severe RIGHT-sided hydronephrosis, similar in comparison to prior ultrasound and CT. This persists postvoid. There is mild cortical thinning.   Electronically Signed By: Stephanie  Peacock M.D. On: 11/27/2021 17:18  No results found for this or any previous visit.  No results found for this or any previous visit.  Results for orders placed during the hospital encounter of 09/23/21  CT RENAL STONE STUDY  Narrative CLINICAL DATA:  Right flank pain. Right hydronephrosis. Chronic kidney disease.  EXAM: CT ABDOMEN AND PELVIS WITHOUT CONTRAST  TECHNIQUE: Multidetector CT imaging of the abdomen and pelvis was performed following the standard protocol without IV contrast.  RADIATION DOSE REDUCTION: This exam was performed according to  the departmental dose-optimization program which includes automated exposure control, adjustment of the mA and/or kV according to patient size and/or use of iterative reconstruction technique.  COMPARISON:  Ultrasound on 09/10/2021  FINDINGS: Lower chest: No acute findings. 6 mm pulmonary nodule in the lateral right lower lobe remains stable since earlier chest CT in 2020, consistent with benign etiology. Emphysema again noted in both lung bases.  Hepatobiliary: No mass visualized on this unenhanced exam. Gallbladder is unremarkable. No evidence of biliary ductal dilatation.  Pancreas: No mass or inflammatory process visualized on this unenhanced exam.  Spleen:  Within normal limits in size.  Adrenals/Urinary tract: Moderate diffuse right renal atrophy is seen. Moderate right hydroureteronephrosis is seen, with a 2 mm calculus in the region of right UVJ. Another small calculus is seen in the bladder in the region of the left UVJ, however there is no evidence of left-sided hydroureteronephrosis.  Stomach/Bowel: No evidence of obstruction, inflammatory process, or abnormal fluid collections.  Vascular/Lymphatic: No pathologically enlarged lymph nodes identified. No evidence of abdominal aortic aneurysm. Aortic atherosclerotic calcification incidentally noted.  Reproductive:  Mildly enlarged prostate.  Other:  None.  Musculoskeletal:  No suspicious bone lesions identified.  IMPRESSION: Moderate right hydroureteronephrosis, with 2 mm calculus in bladder in region of right UVJ.  Moderate diffuse right renal atrophy.  Small bladder calculus in region of left UVJ, but no left-sided hydronephrosis  Mildly enlarged prostate.   Electronically Signed By: John A Stahl M.D. On: 09/24/2021 20:30   Assessment & Plan:    1. Hydronephrosis, unspecified hydronephrosis type -Schedule for ureteroscopy  2. Nephrolithiasis -We discussed the management of kidney stones. These  options include observation, ureteroscopy, shockwave lithotripsy (ESWL) and percutaneous nephrolithotomy (PCNL). We discussed which options are relevant to the patient's stone(s). We discussed the natural history of kidney stones as well as the complications of untreated stones and the impact on quality of life without treatment as well as with each of the above listed treatments. We also discussed the efficacy of each treatment in its ability to clear the stone burden. With any of these management options I discussed the signs and symptoms of infection and the need for emergent treatment should these be experienced. For each option we discussed the ability of each procedure to clear the patient of their stone burden.   For observation I described the risks which include but are not limited to silent renal damage, life-threatening infection, need for emergent surgery, failure to pass stone and pain.   For ureteroscopy I described the risks which include bleeding, infection, damage to contiguous structures, positioning injury, ureteral stricture, ureteral avulsion, ureteral injury, need   for prolonged ureteral stent, inability to perform ureteroscopy, need for an interval procedure, inability to clear stone burden, stent discomfort/pain, heart attack, stroke, pulmonary embolus and the inherent risks with general anesthesia.   For shockwave lithotripsy I described the risks which include arrhythmia, kidney contusion, kidney hemorrhage, need for transfusion, pain, inability to adequately break up stone, inability to pass stone fragments, Steinstrasse, infection associated with obstructing stones, need for alternate surgical procedure, need for repeat shockwave lithotripsy, MI, CVA, PE and the inherent risks with anesthesia/conscious sedation.   For PCNL I described the risks including positioning injury, pneumothorax, hydrothorax, need for chest tube, inability to clear stone burden, renal laceration, arterial  venous fistula or malformation, need for embolization of kidney, loss of kidney or renal function, need for repeat procedure, need for prolonged nephrostomy tube, ureteral avulsion, MI, CVA, PE and the inherent risks of general anesthesia.   - The patient would like to proceed with right ureteroscopic stone extraction.    No follow-ups on file.  Jerri Hargadon, MD   [facility] [place]   

## 2021-12-02 NOTE — Patient Instructions (Addendum)
Your procedure is scheduled on: 12/06/2021  Report to [place] [facility] at   6:00  AM.  Call this number if you have problems the morning of surgery: [phone]   Remember:   Do not Eat or Drink after midnight         No Smoking the morning of surgery  :  Take these medicines the morning of surgery with A SIP OF WATER: Pantoprazole, Flomax, topamax, and oxycodone if needed   Do not wear jewelry, make-up or nail polish.  Do not wear lotions, powders, or perfumes. You may wear deodorant.  Do not shave 48 hours prior to surgery. Men may shave face and neck.  Do not bring valuables to the hospital.  Contacts, dentures or bridgework may not be worn into surgery.  Leave suitcase in the car. After surgery it may be brought to your room.  For patients admitted to the hospital, checkout time is 11:00 AM the day of discharge.   Patients discharged the day of surgery will not be allowed to drive home.    Special Instructions: Shower using CHG night before surgery and shower the day of surgery use CHG.  Use special wash - you have one bottle of CHG for all showers.  You should use approximately 1/2 of the bottle for each shower.  How to Use Chlorhexidine for Bathing Chlorhexidine gluconate (CHG) is a germ-killing (antiseptic) solution that is used to clean the skin. It can get rid of the bacteria that normally live on the skin and can keep them away for about 24 hours. To clean your skin with CHG, you may be given: A CHG solution to use in the shower or as part of a sponge bath. A prepackaged cloth that contains CHG. Cleaning your skin with CHG may help lower the risk for infection: While you are staying in the intensive care unit of the hospital. If you have a vascular access, such as a central line, to provide short-term or long-term access to your veins. If you have a catheter to drain urine from your bladder. If you are on a ventilator. A ventilator is a machine that helps you  breathe by moving air in and out of your lungs. After surgery. What are the risks? Risks of using CHG include: A skin reaction. Hearing loss, if CHG gets in your ears and you have a perforated eardrum. Eye injury, if CHG gets in your eyes and is not rinsed out. The CHG product catching fire. Make sure that you avoid smoking and flames after applying CHG to your skin. Do not use CHG: If you have a chlorhexidine allergy or have previously reacted to chlorhexidine. On babies younger than 51 months of age. How to use CHG solution Use CHG only as told by your health care provider, and follow the instructions on the label. Use the full amount of CHG as directed. Usually, this is one bottle. During a shower Follow these steps when using CHG solution during a shower (unless your health care provider gives you different instructions): Start the shower. Use your normal soap and shampoo to wash your face and hair. Turn off the shower or move out of the shower stream. Pour the CHG onto a clean washcloth. Do not use any type of brush or rough-edged sponge. Starting at your neck, lather your body down to your toes. Make sure you follow these instructions: If you will be having surgery, pay special attention to the part of your body where  you will be having surgery. Scrub this area for at least 1 minute. Do not use CHG on your head or face. If the solution gets into your ears or eyes, rinse them well with water. Avoid your genital area. Avoid any areas of skin that have broken skin, cuts, or scrapes. Scrub your back and under your arms. Make sure to wash skin folds. Let the lather sit on your skin for 1-2 minutes or as long as told by your health care provider. Thoroughly rinse your entire body in the shower. Make sure that all body creases and crevices are rinsed well. Dry off with a clean towel. Do not put any substances on your body afterward--such as powder, lotion, or perfume--unless you are told  to do so by your health care provider. Only use lotions that are recommended by the manufacturer. Put on clean clothes or pajamas. If it is the night before your surgery, sleep in clean sheets.  During a sponge bath Follow these steps when using CHG solution during a sponge bath (unless your health care provider gives you different instructions): Use your normal soap and shampoo to wash your face and hair. Pour the CHG onto a clean washcloth. Starting at your neck, lather your body down to your toes. Make sure you follow these instructions: If you will be having surgery, pay special attention to the part of your body where you will be having surgery. Scrub this area for at least 1 minute. Do not use CHG on your head or face. If the solution gets into your ears or eyes, rinse them well with water. Avoid your genital area. Avoid any areas of skin that have broken skin, cuts, or scrapes. Scrub your back and under your arms. Make sure to wash skin folds. Let the lather sit on your skin for 1-2 minutes or as long as told by your health care provider. Using a different clean, wet washcloth, thoroughly rinse your entire body. Make sure that all body creases and crevices are rinsed well. Dry off with a clean towel. Do not put any substances on your body afterward--such as powder, lotion, or perfume--unless you are told to do so by your health care provider. Only use lotions that are recommended by the manufacturer. Put on clean clothes or pajamas. If it is the night before your surgery, sleep in clean sheets. How to use CHG prepackaged cloths Only use CHG cloths as told by your health care provider, and follow the instructions on the label. Use the CHG cloth on clean, dry skin. Do not use the CHG cloth on your head or face unless your health care provider tells you to. When washing with the CHG cloth: Avoid your genital area. Avoid any areas of skin that have broken skin, cuts, or scrapes. Before  surgery Follow these steps when using a CHG cloth to clean before surgery (unless your health care provider gives you different instructions): Using the CHG cloth, vigorously scrub the part of your body where you will be having surgery. Scrub using a back-and-forth motion for 3 minutes. The area on your body should be completely wet with CHG when you are done scrubbing. Do not rinse. Discard the cloth and let the area air-dry. Do not put any substances on the area afterward, such as powder, lotion, or perfume. Put on clean clothes or pajamas. If it is the night before your surgery, sleep in clean sheets.  For general bathing Follow these steps when using CHG cloths for general  bathing (unless your health care provider gives you different instructions). Use a separate CHG cloth for each area of your body. Make sure you wash between any folds of skin and between your fingers and toes. Wash your body in the following order, switching to a new cloth after each step: The front of your neck, shoulders, and chest. Both of your arms, under your arms, and your hands. Your stomach and groin area, avoiding the genitals. Your right leg and foot. Your left leg and foot. The back of your neck, your back, and your buttocks. Do not rinse. Discard the cloth and let the area air-dry. Do not put any substances on your body afterward--such as powder, lotion, or perfume--unless you are told to do so by your health care provider. Only use lotions that are recommended by the manufacturer. Put on clean clothes or pajamas. Contact a health care provider if: Your skin gets irritated after scrubbing. You have questions about using your solution or cloth. You swallow any chlorhexidine. Call your local poison control center (1-731-496-7150 in the [place] Get help right away if: Your eyes itch badly, or they become very red or swollen. Your skin itches badly and is red or swollen. Your hearing changes. You have trouble  seeing. You have swelling or tingling in your mouth or throat. You have trouble breathing. These symptoms may represent a serious problem that is an emergency. Do not wait to see if the symptoms will go away. Get medical help right away. Call your local emergency services (911 in the [place] Do not drive yourself to the hospital. Summary Chlorhexidine gluconate (CHG) is a germ-killing (antiseptic) solution that is used to clean the skin. Cleaning your skin with CHG may help to lower your risk for infection. You may be given CHG to use for bathing. It may be in a bottle or in a prepackaged cloth to use on your skin. Carefully follow your health care provider's instructions and the instructions on the product label. Do not use CHG if you have a chlorhexidine allergy. Contact your health care provider if your skin gets irritated after scrubbing. This information is not intended to replace advice given to you by your health care provider. Make sure you discuss any questions you have with your health care provider. Document Revised: 07/09/2020 Document Reviewed: 07/09/2020 Elsevier Patient Education  [place].  Ureteral Stent Implantation, Care After This sheet gives you information about how to care for yourself after your procedure. Your health care provider may also give you more specific instructions. If you have problems or questions, contact your health care provider. What can I expect after the procedure? After the procedure, it is common to have: Nausea. Mild pain when you urinate. You may feel this pain in your lower back or lower abdomen. The pain should stop within a few minutes after you urinate. This may last for up to 1 week. A small amount of blood in your urine for several days. Follow these instructions at home: Medicines Take over-the-counter and prescription medicines only as told by your health care provider. If you were prescribed an antibiotic medicine, take it as  told by your health care provider. Do not stop taking the antibiotic even if you start to feel better. Do not drive for 24 hours if you were given a sedative during your procedure. Ask your health care provider if the medicine prescribed to you requires you to avoid driving or using heavy machinery. Activity Rest as told by your  health care provider. Avoid sitting for a long time without moving. Get up to take short walks every 1-2 hours. This is important to improve blood flow and breathing. Ask for help if you feel weak or unsteady. Return to your normal activities as told by your health care provider. Ask your health care provider what activities are safe for you. General instructions  Watch for any blood in your urine. Call your health care provider if the amount of blood in your urine increases. If you have a catheter: Follow instructions from your health care provider about taking care of your catheter and collection bag. Do not take baths, swim, or use a hot tub until your health care provider approves. Ask your health care provider if you may take showers. You may only be allowed to take sponge baths. Drink enough fluid to keep your urine pale yellow. Do not use any products that contain nicotine or tobacco, such as cigarettes, e-cigarettes, and chewing tobacco. These can delay healing after surgery. If you need help quitting, ask your health care provider. Keep all follow-up visits as told by your health care provider. This is important. Contact a health care provider if: You have pain that gets worse or does not get better with medicine, especially pain when you urinate. You have difficulty urinating. You feel nauseous or you vomit repeatedly during a period of more than 2 days after the procedure. Get help right away if: Your urine is dark red or has blood clots in it. You are leaking urine (have incontinence). The end of the stent comes out of your urethra. You cannot  urinate. You have sudden, sharp, or severe pain in your abdomen or lower back. You have a fever. You have swelling or pain in your legs. You have difficulty breathing. Summary After the procedure, it is common to have mild pain when you urinate that goes away within a few minutes after you urinate. This may last for up to 1 week. Watch for any blood in your urine. Call your health care provider if the amount of blood in your urine increases. Take over-the-counter and prescription medicines only as told by your health care provider. Drink enough fluid to keep your urine pale yellow. This information is not intended to replace advice given to you by your health care provider. Make sure you discuss any questions you have with your health care provider. Document Revised: 03/25/2021 Document Reviewed: 03/04/2021 Elsevier Patient Education  2022 [place] for Kidney Stones, Care After This sheet gives you information about how to care for yourself after your procedure. Your health care provider may also give you more specific instructions. If you have problems or questions, contact your health care provider. What can I expect after the procedure? After the procedure, it is common to have: Pain. A burning sensation while urinating. Small amounts of blood in your urine. A need to urinate frequently. Pieces of kidney stone in your urine. Mild discomfort when urinating that may be felt in the back. You may experience this if you have a flexible tube (stent) in your ureter. Follow these instructions at home:  Medicines Take over-the-counter and prescription medicines only as told by your health care provider. If you were prescribed an antibiotic medicine, take it as told by your health care provider. Do not stop taking the antibiotic even if you start to feel better. Ask your health care provider if the medicine prescribed to you: Requires you to avoid driving or using  heavy  machinery. Can cause constipation. You may need to take actions to prevent or treat constipation, such as: Take over-the-counter or prescription medicines. Eat foods that are high in fiber, such as beans, whole grains, and fresh fruits and vegetables. Limit foods that are high in fat and processed sugars, such as fried or sweet foods. Activity Return to your normal activities as told by your health care provider. Ask your health care provider what activities are safe for you. Do not drive for 24 hours if you were given a sedative during your procedure. General instructions If your health care provider approves, you may take a warm bath to ease discomfort and burning. Drink enough fluid to keep your urine pale yellow. Your health care provider may recommend drinking two 8 oz (237 mL) glasses of water per hour for a few hours after your procedure. You may be asked to strain your urine to collect any stone fragments that you pass. These fragments may be tested. Keep all follow-up visits as told by your health care provider. This is important. If you have a stent, you will need to return to your health care provider to have the stent removed. Contact a health care provider if you: Have pain or a burning feeling that lasts more than 2 days. Feel nauseous. Vomit more and more often. Have difficulty urinating. Have pain that gets worse or does not get better with medicine. Get help right away if: You are unable to urinate, even if your bladder feels full. You have: Bright red blood or blood clots in your urine. More blood in your urine. Severe pain or discomfort. A fever or shaking chills. Abdominal pain. Difficulty breathing. Swelling in your legs. Summary After the procedure, it is common to have a burning sensation while urinating and small amounts of blood in your urine. Take over-the-counter and prescription medicines only as told by your health care provider. Drink enough fluid to keep  your urine pale yellow. Keep all follow-up visits as told by your health care provider. This is important. This information is not intended to replace advice given to you by your health care provider. Make sure you discuss any questions you have with your health care provider. Document Revised: 12/31/2020 Document Reviewed: 12/31/2020 Elsevier Patient Education  [place] Anesthesia, Adult, Care After This sheet gives you information about how to care for yourself after your procedure. Your health care provider may also give you more specific instructions. If you have problems or questions, contact your health care provider. What can I expect after the procedure? After the procedure, the following side effects are common: Pain or discomfort at the IV site. Nausea. Vomiting. Sore throat. Trouble concentrating. Feeling cold or chills. Feeling weak or tired. Sleepiness and fatigue. Soreness and body aches. These side effects can affect parts of the body that were not involved in surgery. Follow these instructions at home: For the time period you were told by your health care provider:  Rest. Do not participate in activities where you could fall or become injured. Do not drive or use machinery. Do not drink alcohol. Do not take sleeping pills or medicines that cause drowsiness. Do not make important decisions or sign legal documents. Do not take care of children on your own. Eating and drinking Follow any instructions from your health care provider about eating or drinking restrictions. When you feel hungry, start by eating small amounts of foods that are soft and easy to digest (bland), such  as toast. Gradually return to your regular diet. Drink enough fluid to keep your urine pale yellow. If you vomit, rehydrate by drinking water, juice, or clear broth. General instructions If you have sleep apnea, surgery and certain medicines can increase your risk for breathing  problems. Follow instructions from your health care provider about wearing your sleep device: Anytime you are sleeping, including during daytime naps. While taking prescription pain medicines, sleeping medicines, or medicines that make you drowsy. Have a responsible adult stay with you for the time you are told. It is important to have someone help care for you until you are awake and alert. Return to your normal activities as told by your health care provider. Ask your health care provider what activities are safe for you. Take over-the-counter and prescription medicines only as told by your health care provider. If you smoke, do not smoke without supervision. Keep all follow-up visits as told by your health care provider. This is important. Contact a health care provider if: You have nausea or vomiting that does not get better with medicine. You cannot eat or drink without vomiting. You have pain that does not get better with medicine. You are unable to pass urine. You develop a skin rash. You have a fever. You have redness around your IV site that gets worse. Get help right away if: You have difficulty breathing. You have chest pain. You have blood in your urine or stool, or you vomit blood. Summary After the procedure, it is common to have a sore throat or nausea. It is also common to feel tired. Have a responsible adult stay with you for the time you are told. It is important to have someone help care for you until you are awake and alert. When you feel hungry, start by eating small amounts of foods that are soft and easy to digest (bland), such as toast. Gradually return to your regular diet. Drink enough fluid to keep your urine pale yellow. Return to your normal activities as told by your health care provider. Ask your health care provider what activities are safe for you. This information is not intended to replace advice given to you by your health care provider. Make sure you  discuss any questions you have with your health care provider. Document Revised: 01/12/2020 Document Reviewed: 08/11/2019 Elsevier Patient Education  [place].

## 2021-12-02 NOTE — Progress Notes (Signed)
I spoke with Mr. Vertz. We have discussed possible surgery dates and 12/06/2021 was agreed upon by all parties. Patient given information about surgery date, what to expect pre-operatively and post operatively.    We discussed that a pre-op nurse will be calling to set up the pre-op visit that will take place prior to surgery. Informed patient that our office will communicate any additional care to be provided after surgery.    Patients questions or concerns were discussed during our call. Advised to call our office should there be any additional information, questions or concerns that arise. Patient verbalized understanding.

## 2021-12-04 ENCOUNTER — Encounter (HOSPITAL_COMMUNITY): Payer: Self-pay

## 2021-12-04 ENCOUNTER — Other Ambulatory Visit: Payer: Self-pay

## 2021-12-04 ENCOUNTER — Encounter (HOSPITAL_COMMUNITY)
Admission: RE | Admit: 2021-12-04 | Discharge: 2021-12-04 | Disposition: A | Discharge status: Home / Self Care | Payer: Medicare Other | Source: Ambulatory Visit | Attending: Urology | Admitting: Urology

## 2021-12-06 ENCOUNTER — Encounter (HOSPITAL_COMMUNITY)
Admission: RE | Disposition: A | Discharge status: Home / Self Care | Payer: Self-pay | Source: Ambulatory Visit | Attending: Urology

## 2021-12-06 ENCOUNTER — Ambulatory Visit (HOSPITAL_COMMUNITY): Payer: Medicare Other | Admitting: Anesthesiology

## 2021-12-06 ENCOUNTER — Encounter (HOSPITAL_COMMUNITY): Payer: Self-pay | Admitting: Urology

## 2021-12-06 ENCOUNTER — Ambulatory Visit (HOSPITAL_COMMUNITY): Payer: Medicare Other

## 2021-12-06 ENCOUNTER — Ambulatory Visit (HOSPITAL_BASED_OUTPATIENT_CLINIC_OR_DEPARTMENT_OTHER): Payer: Medicare Other | Admitting: Anesthesiology

## 2021-12-06 ENCOUNTER — Ambulatory Visit (HOSPITAL_COMMUNITY)
Admission: RE | Admit: 2021-12-06 | Discharge: 2021-12-06 | Disposition: A | Discharge status: Home / Self Care | Payer: Medicare Other | Source: Ambulatory Visit | Attending: Urology | Admitting: Urology

## 2021-12-06 DIAGNOSIS — J449 Chronic obstructive pulmonary disease, unspecified: Secondary | ICD-10-CM

## 2021-12-06 DIAGNOSIS — K219 Gastro-esophageal reflux disease without esophagitis: Secondary | ICD-10-CM | POA: Insufficient documentation

## 2021-12-06 DIAGNOSIS — D494 Neoplasm of unspecified behavior of bladder: Secondary | ICD-10-CM

## 2021-12-06 DIAGNOSIS — N1330 Unspecified hydronephrosis: Secondary | ICD-10-CM | POA: Insufficient documentation

## 2021-12-06 DIAGNOSIS — C676 Malignant neoplasm of ureteric orifice: Secondary | ICD-10-CM | POA: Insufficient documentation

## 2021-12-06 DIAGNOSIS — F17210 Nicotine dependence, cigarettes, uncomplicated: Secondary | ICD-10-CM | POA: Diagnosis not present

## 2021-12-06 DIAGNOSIS — R519 Headache, unspecified: Secondary | ICD-10-CM | POA: Insufficient documentation

## 2021-12-06 DIAGNOSIS — N189 Chronic kidney disease, unspecified: Secondary | ICD-10-CM | POA: Diagnosis not present

## 2021-12-06 HISTORY — PX: CYSTOSCOPY: SHX5120

## 2021-12-06 HISTORY — PX: TRANSURETHRAL RESECTION OF BLADDER TUMOR: SHX2575

## 2021-12-06 SURGERY — CYSTOSCOPY
Anesthesia: General | Site: Bladder | Laterality: Right

## 2021-12-06 MED ORDER — ROCURONIUM BROMIDE 10 MG/ML (PF) SYRINGE
PREFILLED_SYRINGE | INTRAVENOUS | Status: AC
  Filled 2021-12-06: qty 10

## 2021-12-06 MED ORDER — OXYCODONE-ACETAMINOPHEN 7.5-325 MG PO TABS: 1.0000 | ORAL_TABLET | ORAL | 0 refills | Status: DC

## 2021-12-06 MED ORDER — ORAL CARE MOUTH RINSE: 15.0000 mL | Freq: Once | OROMUCOSAL | Status: DC

## 2021-12-06 MED ORDER — ROCURONIUM 10MG/ML (10ML) SYRINGE FOR MEDFUSION PUMP - OPTIME
INTRAVENOUS | Status: DC | PRN
  Administered 2021-12-06: 40 mg via INTRAVENOUS

## 2021-12-06 MED ORDER — CHLORHEXIDINE GLUCONATE 0.12 % MT SOLN: 15.0000 mL | Freq: Once | OROMUCOSAL | Status: DC

## 2021-12-06 MED ORDER — PROPOFOL 10 MG/ML IV BOLUS
INTRAVENOUS | Status: AC
  Filled 2021-12-06: qty 20

## 2021-12-06 MED ORDER — DIATRIZOATE MEGLUMINE 30 % UR SOLN
URETHRAL | Status: DC | PRN
  Administered 2021-12-06: 7 mL via URETHRAL

## 2021-12-06 MED ORDER — FENTANYL CITRATE PF 50 MCG/ML IJ SOSY
25.0000 ug | PREFILLED_SYRINGE | INTRAMUSCULAR | Status: DC | PRN
  Administered 2021-12-06: 50 ug via INTRAVENOUS
  Filled 2021-12-06: qty 1

## 2021-12-06 MED ORDER — EPHEDRINE SULFATE (PRESSORS) 50 MG/ML IJ SOLN
INTRAMUSCULAR | Status: DC | PRN
  Administered 2021-12-06 (×2): 10 mg via INTRAVENOUS

## 2021-12-06 MED ORDER — CEFAZOLIN SODIUM-DEXTROSE 2-4 GM/100ML-% IV SOLN
2.0000 g | INTRAVENOUS | Status: AC
Start: 2021-12-06 — End: 2021-12-06
  Administered 2021-12-06: 2 g via INTRAVENOUS

## 2021-12-06 MED ORDER — ONDANSETRON HCL 4 MG/2ML IJ SOLN: 4.0000 mg | Freq: Once | INTRAMUSCULAR | Status: DC | PRN

## 2021-12-06 MED ORDER — PROPOFOL 10 MG/ML IV BOLUS
INTRAVENOUS | Status: DC | PRN
  Administered 2021-12-06: 150 mg via INTRAVENOUS

## 2021-12-06 MED ORDER — WATER FOR IRRIGATION, STERILE IR SOLN
Status: DC | PRN
  Administered 2021-12-06: 500 mL

## 2021-12-06 MED ORDER — FENTANYL CITRATE (PF) 100 MCG/2ML IJ SOLN
INTRAMUSCULAR | Status: DC | PRN
  Administered 2021-12-06 (×2): 25 ug via INTRAVENOUS
  Administered 2021-12-06 (×3): 50 ug via INTRAVENOUS

## 2021-12-06 MED ORDER — ONDANSETRON HCL 4 MG/2ML IJ SOLN
INTRAMUSCULAR | Status: AC
  Filled 2021-12-06: qty 2

## 2021-12-06 MED ORDER — EPHEDRINE 5 MG/ML INJ
INTRAVENOUS | Status: AC
  Filled 2021-12-06: qty 5

## 2021-12-06 MED ORDER — FENTANYL CITRATE (PF) 100 MCG/2ML IJ SOLN
INTRAMUSCULAR | Status: AC
  Filled 2021-12-06: qty 2

## 2021-12-06 MED ORDER — DEXMEDETOMIDINE HCL IN NACL 200 MCG/50ML IV SOLN
INTRAVENOUS | Status: DC | PRN
  Administered 2021-12-06: 10 ug via INTRAVENOUS

## 2021-12-06 MED ORDER — SODIUM CHLORIDE 0.9 % IR SOLN
Status: DC | PRN
  Administered 2021-12-06 (×2): 3000 mL

## 2021-12-06 MED ORDER — MIDAZOLAM HCL 5 MG/5ML IJ SOLN
INTRAMUSCULAR | Status: DC | PRN
  Administered 2021-12-06: 2 mg via INTRAVENOUS

## 2021-12-06 MED ORDER — CEFAZOLIN SODIUM-DEXTROSE 2-4 GM/100ML-% IV SOLN
INTRAVENOUS | Status: AC
  Filled 2021-12-06: qty 100

## 2021-12-06 MED ORDER — PHENYLEPHRINE HCL (PRESSORS) 10 MG/ML IV SOLN
INTRAVENOUS | Status: DC | PRN
  Administered 2021-12-06: 80 ug via INTRAVENOUS

## 2021-12-06 MED ORDER — CHLORHEXIDINE GLUCONATE 0.12 % MT SOLN
OROMUCOSAL | Status: AC
  Filled 2021-12-06: qty 15

## 2021-12-06 MED ORDER — LACTATED RINGERS IV SOLN: INTRAVENOUS | Status: DC

## 2021-12-06 MED ORDER — SUGAMMADEX SODIUM 200 MG/2ML IV SOLN
INTRAVENOUS | Status: DC | PRN
  Administered 2021-12-06: 200 mg via INTRAVENOUS

## 2021-12-06 MED ORDER — LIDOCAINE HCL (CARDIAC) PF 50 MG/5ML IV SOSY
PREFILLED_SYRINGE | INTRAVENOUS | Status: DC | PRN
  Administered 2021-12-06: 80 mg via INTRAVENOUS

## 2021-12-06 MED ORDER — DEXMEDETOMIDINE HCL IN NACL 80 MCG/20ML IV SOLN
INTRAVENOUS | Status: AC
  Filled 2021-12-06: qty 20

## 2021-12-06 MED ORDER — MIDAZOLAM HCL 2 MG/2ML IJ SOLN
INTRAMUSCULAR | Status: AC
  Filled 2021-12-06: qty 2

## 2021-12-06 MED ORDER — ONDANSETRON HCL 4 MG/2ML IJ SOLN
INTRAMUSCULAR | Status: DC | PRN
  Administered 2021-12-06: 4 mg via INTRAVENOUS

## 2021-12-06 MED ORDER — DIATRIZOATE MEGLUMINE 30 % UR SOLN
URETHRAL | Status: AC
  Filled 2021-12-06: qty 100

## 2021-12-06 MED ORDER — LIDOCAINE HCL (PF) 2 % IJ SOLN
INTRAMUSCULAR | Status: AC
  Filled 2021-12-06: qty 5

## 2021-12-06 MED ORDER — PHENYLEPHRINE 80 MCG/ML (10ML) SYRINGE FOR IV PUSH (FOR BLOOD PRESSURE SUPPORT)
PREFILLED_SYRINGE | INTRAVENOUS | Status: AC
  Filled 2021-12-06: qty 10

## 2021-12-06 SURGICAL SUPPLY — 33 items
BAG DRAIN URO TABLE W/ADPT NS (BAG) ×5 IMPLANT
BAG DRN 8 ADPR NS SKTRN CSTL (BAG) ×3
BAG DRN RND TRDRP ANRFLXCHMBR (UROLOGICAL SUPPLIES) ×3
BAG HAMPER (MISCELLANEOUS) ×5 IMPLANT
BAG URINE DRAIN 2000ML AR STRL (UROLOGICAL SUPPLIES) ×2 IMPLANT
CATH FOLEY 2WAY SLVR  5CC 22FR (CATHETERS) ×4
CATH FOLEY 2WAY SLVR 5CC 22FR (CATHETERS) ×1 IMPLANT
CATH INTERMIT  6FR 70CM (CATHETERS) ×5 IMPLANT
CLOTH BEACON ORANGE TIMEOUT ST (SAFETY) ×5 IMPLANT
ELECT LOOP 22F BIPOLAR SML (ELECTROSURGICAL) ×4
ELECTRODE LOOP 22F BIPOLAR SML (ELECTROSURGICAL) ×1 IMPLANT
GLOVE BIO SURGEON STRL SZ8 (GLOVE) ×5 IMPLANT
GLOVE BIOGEL PI IND STRL 6.5 (GLOVE) ×1 IMPLANT
GLOVE BIOGEL PI IND STRL 7.0 (GLOVE) ×8 IMPLANT
GLOVE BIOGEL PI INDICATOR 6.5 (GLOVE) ×1
GLOVE BIOGEL PI INDICATOR 7.0 (GLOVE) ×2
GLOVE SS BIOGEL STRL SZ 6.5 (GLOVE) ×1 IMPLANT
GLOVE SUPERSENSE BIOGEL SZ 6.5 (GLOVE) ×1
GOWN STRL REUS W/TWL LRG LVL3 (GOWN DISPOSABLE) ×5 IMPLANT
GOWN STRL REUS W/TWL XL LVL3 (GOWN DISPOSABLE) ×5 IMPLANT
GUIDEWIRE STR DUAL SENSOR (WIRE) ×5 IMPLANT
GUIDEWIRE STR ZIPWIRE 035X150 (MISCELLANEOUS) ×5 IMPLANT
IV NS IRRIG 3000ML ARTHROMATIC (IV SOLUTION) ×12 IMPLANT
KIT TURNOVER CYSTO (KITS) ×5 IMPLANT
MANIFOLD NEPTUNE II (INSTRUMENTS) ×5 IMPLANT
PACK CYSTO (CUSTOM PROCEDURE TRAY) ×5 IMPLANT
PAD ARMBOARD 7.5X6 YLW CONV (MISCELLANEOUS) ×5 IMPLANT
PLUG CATH AND CAP STER (CATHETERS) ×2 IMPLANT
SYR 10ML LL (SYRINGE) ×5 IMPLANT
SYR CONTROL 10ML LL (SYRINGE) ×5 IMPLANT
SYR TOOMEY IRRIG 70ML (MISCELLANEOUS) ×4
SYRINGE TOOMEY IRRIG 70ML (MISCELLANEOUS) ×1 IMPLANT
WATER STERILE IRR 500ML POUR (IV SOLUTION) ×5 IMPLANT

## 2021-12-06 NOTE — Transfer of Care (Signed)
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  Last Pain:  Vitals:   12/06/21 0648  PainSc: 0-No pain         Complications: No notable events documented.

## 2021-12-06 NOTE — Anesthesia Procedure Notes (Signed)
Procedure Name: LMA Insertion Date/Time: 12/06/2021 7:45 AM  Performed by: Ollen Bowl, CRNAPre-anesthesia Checklist: Patient identified, Patient being monitored, Emergency Drugs available, Timeout performed and Suction available Patient Re-evaluated:Patient Re-evaluated prior to induction Oxygen Delivery Method: Circle System Utilized Preoxygenation: Pre-oxygenation with 100% oxygen Induction Type: IV induction Ventilation: Mask ventilation without difficulty LMA: LMA inserted LMA Size: 4.0 Number of attempts: 1 Placement Confirmation: positive ETCO2 and breath sounds checked- equal and bilateral

## 2021-12-06 NOTE — Anesthesia Preprocedure Evaluation (Signed)
Anesthesia Evaluation  Patient identified by MRN, date of birth, ID band Patient awake    Reviewed: Allergy & Precautions, H&P , NPO status , Patient's Chart, lab work & pertinent test results, reviewed documented beta blocker date and time   Airway Mallampati: II  TM Distance: >3 FB Neck ROM: full    Dental no notable dental hx.    Pulmonary COPD, Current Smoker and Patient abstained from smoking.,    Pulmonary exam normal breath sounds clear to auscultation       Cardiovascular Exercise Tolerance: Good negative cardio ROS   Rhythm:regular Rate:Normal     Neuro/Psych  Headaches, negative psych ROS   GI/Hepatic Neg liver ROS, GERD  Medicated,  Endo/Other  negative endocrine ROS  Renal/GU CRFRenal disease  negative genitourinary   Musculoskeletal   Abdominal   Peds  Hematology negative hematology ROS (+)   Anesthesia Other Findings   Reproductive/Obstetrics negative OB ROS                             Anesthesia Physical Anesthesia Plan  ASA: 3  Anesthesia Plan: General and General LMA   Post-op Pain Management:    Induction:   PONV Risk Score and Plan: Ondansetron  Airway Management Planned:   Additional Equipment:   Intra-op Plan:   Post-operative Plan:   Informed Consent: I have reviewed the patients History and Physical, chart, labs and discussed the procedure including the risks, benefits and alternatives for the proposed anesthesia with the patient or authorized representative who has indicated his/her understanding and acceptance.     Dental Advisory Given  Plan Discussed with: CRNA  Anesthesia Plan Comments:         Anesthesia Quick Evaluation

## 2021-12-06 NOTE — Anesthesia Procedure Notes (Signed)
Procedure Name: Intubation Date/Time: 12/06/2021 8:02 AM  Performed by: Ollen Bowl, CRNAPre-anesthesia Checklist: Patient identified, Patient being monitored, Timeout performed, Emergency Drugs available and Suction available Patient Re-evaluated:Patient Re-evaluated prior to induction Oxygen Delivery Method: Circle system utilized Preoxygenation: Pre-oxygenation with 100% oxygen Induction Type: IV induction Ventilation: Mask ventilation without difficulty Laryngoscope Size: Mac and 3 Grade View: Grade I Tube type: Oral Tube size: 7.0 mm Number of attempts: 1 Airway Equipment and Method: Stylet Placement Confirmation: ETT inserted through vocal cords under direct vision, positive ETCO2 and breath sounds checked- equal and bilateral Secured at: 22 cm Tube secured with: Tape Dental Injury: Teeth and Oropharynx as per pre-operative assessment

## 2021-12-06 NOTE — Interval H&P Note (Signed)
History and Physical Interval Note:  12/06/2021 7:31 AM  Dustin Walker  has presented today for surgery, with the diagnosis of Right Ureteral Stone.  The various methods of treatment have been discussed with the patient and family. After consideration of risks, benefits and other options for treatment, the patient has consented to  Procedure(s): CYSTOSCOPY WITH RETROGRADE PYELOGRAM, URETEROSCOPY AND STENT PLACEMENT (Right) HOLMIUM LASER APPLICATION (Right) as a surgical intervention.  The patient's history has been reviewed, patient examined, no change in status, stable for surgery.  I have reviewed the patient's chart and labs.  Questions were answered to the patient's satisfaction.     [name]

## 2021-12-06 NOTE — Op Note (Signed)
.  Preoperative diagnosis: right hydronephrosis  Postoperative diagnosis: bladder tumor  Procedure: 1 cystoscopy 2. Transurethral resection of bladder tumor, medium  Attending: Rosie Fate  Anesthesia: General  Estimated blood loss: Minimal  Drains: 22 French foley  Specimens: bladder tumor  Antibiotics: ancef  Findings: 3cm papillary involving right ureteral orifice. Unable to identify the right ureteral orifice.   Indications: Patient is a 71 year old male with a history of right hydronephrosis and concern for ureteral calculus on CT.  After discussing treatment options, they decided proceed with right diagnostic ureteroscopy and stone extraction  Procedure her in detail: The patient was brought to the operating room and a brief timeout was done to ensure correct patient, correct procedure, correct site.  General anesthesia was administered patient was placed in dorsal lithotomy position.  Their genitalia was then prepped and draped in usual sterile fashion.  A rigid 43 French cystoscope was passed in the urethra and the bladder.  Bladder was inspected and we noted a large amount of clot and a ***cm bladder tumor.  the ureteral orifices were in the normal orthotopic locations.  a 6 french ureteral catheter was then instilled into the left ureteral orifice.  a gentle retrograde was obtained and findings noted above. We then turned our attention to the right side. a 6 french ureteral catheter was then instilled into the right ureteral orifice.  a gentle retrograde was obtained and findings noted above. We then removed the cystoscope and placed a resectoscope into the bladder. We proceeded to remove the large clot burden from the bladder. Once this was complete we turned our attention to the bladder tumor. Using the bipolar resectoscope we removed the bladder tumor down to the base. A subsequent muscle deep biopsy was then taken. Hemostasis was then obtained with electrocautery. We then  removed the bladder tumor chips and sent them for pathology. We then re-inspected the bladder and found no residula bleeding.  the bladder was then drained, a 22 French foley was placed and this concluded the procedure which was well tolerated by patient.  Complications: None  Condition: Stable, extubated, transferred to PACU  Plan: Patient is admitted overnight with continuous bladder irrigation. If their urine is clear tomorrow they will be discharged home and followup in 5 days for foley catheter removal and pathology discussion.

## 2021-12-06 NOTE — Anesthesia Postprocedure Evaluation (Signed)
Anesthesia Post Note  Patient: Dustin Walker  Procedure(s) Performed: CYSTOSCOPY (Right: Bladder) TRANSURETHRAL RESECTION OF BLADDER TUMOR (TURBT) (Bladder)  Patient location during evaluation: Phase II Anesthesia Type: General Level of consciousness: awake Pain management: pain level controlled Vital Signs Assessment: post-procedure vital signs reviewed and stable Respiratory status: spontaneous breathing and respiratory function stable Cardiovascular status: blood pressure returned to baseline and stable Postop Assessment: no headache and no apparent nausea or vomiting Anesthetic complications: no Comments: Late entry   No notable events documented.   Last Vitals:  Vitals:   12/06/21 0930 12/06/21 0935  BP: 100/61 (!) 105/57  Pulse: (!) 56 (!) 58  Resp: 12 11  Temp:  36.6 C  SpO2:  92%    Last Pain:  Vitals:   12/06/21 0935  TempSrc: Axillary  PainSc: [place]

## 2021-12-08 ENCOUNTER — Encounter: Payer: Self-pay | Admitting: Urology

## 2021-12-09 ENCOUNTER — Encounter (HOSPITAL_COMMUNITY): Payer: Self-pay | Admitting: Urology

## 2021-12-09 ENCOUNTER — Telehealth: Payer: Self-pay

## 2021-12-09 LAB — SURGICAL PATHOLOGY

## 2021-12-09 MED ORDER — PHENAZOPYRIDINE HCL 100 MG PO TABS: 100.0000 mg | ORAL_TABLET | Freq: Three times a day (TID) | ORAL | 0 refills | Status: DC | PRN

## 2021-12-09 NOTE — Telephone Encounter (Signed)
Patient states he keeps urinating around his catheter. Patient advised he is having bladder spasms which can happen with a catheter in place.  Patient made aware MD will be notified and will f/u with patient with his recommendations.

## 2021-12-09 NOTE — Telephone Encounter (Signed)
Patient called and made aware of Pyridium sent to the pharmacy per Dr. Alyson Ingles.

## 2021-12-11 ENCOUNTER — Ambulatory Visit (INDEPENDENT_AMBULATORY_CARE_PROVIDER_SITE_OTHER): Payer: Medicare Other | Admitting: Urology

## 2021-12-11 ENCOUNTER — Encounter: Payer: Self-pay | Admitting: Urology

## 2021-12-11 VITALS — BP 106/52 | HR 84

## 2021-12-11 DIAGNOSIS — N200 Calculus of kidney: Secondary | ICD-10-CM | POA: Diagnosis not present

## 2021-12-11 DIAGNOSIS — N1330 Unspecified hydronephrosis: Secondary | ICD-10-CM

## 2021-12-11 NOTE — Progress Notes (Signed)
12/11/2021 2:12 PM   Dustin Walker 14-Jun-1950 [id number]  Referring provider: Carylon Perches, NP [place],  [place] [id number]  Fol[place]up bladder tumor resection   HPI: Dustin Walker is a 71yo here for followup after bladder tumor resection. 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 He has an obstructed right kidney from the tumor. Voiding trial passed today.    PMH: Past Medical History:  Diagnosis Date   Allergic rhinitis    Arthritis    BCC (basal cell carcinoma) 04/02/2015   left upper eyelid, 06/12/15 MOHs   Chronic pain    left hand, crush injury in 2013   COPD (chronic obstructive pulmonary disease) (HCC)    GERD (gastroesophageal reflux disease)    Headache    migraines   SCC (squamous cell carcinoma) 10/29/2015   well diff, upper mid bac (CX3, 5FU)    Surgical History: Past Surgical History:  Procedure Laterality Date   COLONOSCOPY  2010   Dr. Gala Romney: tubular adenoma removed. was due for surveillance in 2015.   COLONOSCOPY WITH PROPOFOL N/A 02/22/2018   Procedure: COLONOSCOPY WITH PROPOFOL;  Surgeon: Daneil Dolin, MD;  [facility]: [facility];  Service: Endoscopy;  Laterality: N/A;  12:00pm   CYSTOSCOPY Right 12/06/2021   Procedure: CYSTOSCOPY;  Surgeon: Cleon Gustin, MD;  Location: [facility];  Service: Urology;  Laterality: Right;   HAND SURGERY Right    middle finger   I & D EXTREMITY  06/19/2011   Procedure: IRRIGATION AND DEBRIDEMENT EXTREMITY;  Surgeon: Dennie Bible, MD;  Location: [place];  Service: Plastics;;   JOINT REPLACEMENT     knee   KNEE SURGERY Bilateral    6 times per knee and total knee replacement on the left   ORIF ULNAR FRACTURE  06/19/2011   Procedure: OPEN REDUCTION INTERNAL FIXATION (ORIF) ULNAR FRACTURE;  Surgeon: Dennie Bible, MD;  Location: [place];  Service: Plastics;  Laterality: Left;   SHOULDER SURGERY Right    SPINAL CORD STIMULATOR BATTERY EXCHANGE Left 02/11/2021   Procedure: REPLACEMENT PULSE  GENERATOR LEFT FLANK (MEDTRONIC);  Surgeon: [name], MD;  Location: [facility];  Service: Neurosurgery;  Laterality: Left;  LOCAL W/ MAC   TRANSURETHRAL RESECTION OF BLADDER TUMOR N/A 12/06/2021   Procedure: TRANSURETHRAL RESECTION OF BLADDER TUMOR (TURBT);  Surgeon: Cleon Gustin, MD;  Location: [facility];  Service: Urology;  Laterality: N/A;    Home Medications:  Allergies as of 12/11/2021       Reactions   Naproxen Sodium Rash, Other (See Comments)   "Burns from inside out"   Codeine Rash, Other (See Comments)   Burning        Medication List        Accurate as of December 11, 2021  2:12 PM. If you have any questions, ask your nurse or doctor.          albuterol 108 (90 Base) MCG/ACT inhaler Commonly known as: VENTOLIN HFA Inhale 2 puffs into the lungs every 6 (six) hours as needed for wheezing or shortness of breath. What changed: additional instructions   nortriptyline 50 MG capsule Commonly known as: PAMELOR Take 2 capsules (100 mg total) by mouth at bedtime.   Nurtec 75 MG Tbdp Generic drug: Rimegepant Sulfate Take 75 mg by mouth daily as needed (Migraine).   oxyCODONE-acetaminophen 7.5-325 MG tablet Commonly known as: PERCOCET Take 1 tablet by mouth See admin instructions. Take three to 4 times a day   pantoprazole 40 MG tablet  Commonly known as: PROTONIX Take 1 tablet (40 mg total) by mouth daily. What changed: when to take this   phenazopyridine 100 MG tablet Commonly known as: Pyridium Take 1 tablet (100 mg total) by mouth 3 (three) times daily as needed for pain.   tamsulosin 0.4 MG Caps capsule Commonly known as: FLOMAX Take 1 capsule (0.4 mg total) by mouth daily.   topiramate 50 MG tablet Commonly known as: TOPAMAX Take 50 mg by mouth 2 (two) times daily.        Allergies:  Allergies  Allergen Reactions   Naproxen Sodium Rash and Other (See Comments)    "Burns from inside out"   Codeine Rash and Other (See Comments)    Burning     Family History: Family History  Problem Relation Age of Onset   Colon cancer Neg Hx     Social History:  reports that he has been smoking cigarettes. He started smoking about 59 years ago. He has a 49.00 pack-year smoking history. He has never used smokeless tobacco. He reports that he does not currently use alcohol. He reports that he does not use drugs.  ROS: All other review of systems were reviewed and are negative except what is noted above in HPI  Physical Exam: BP (!) 106/52   Pulse 84   Constitutional:  Alert and oriented, No acute distress. HEENT:  AT, moist mucus membranes.  Trachea midline, no masses. Cardiovascular: No clubbing, cyanosis, or edema. Respiratory: Normal respiratory effort, no increased work of breathing. GI: Abdomen is soft, nontender, nondistended, no abdominal masses GU: No CVA tenderness.  Lymph: No cervical or inguinal lymphadenopathy. Skin: No rashes, bruises or suspicious lesions. Neurologic: Grossly intact, no focal deficits, moving all 4 extremities. Psychiatric: Normal mood and affect.  Laboratory Data: Lab Results  Component Value Date   WBC 6.3 02/01/2021   HGB 13.8 02/01/2021   HCT 41.0 02/01/2021   MCV 94.7 02/01/2021   PLT 204 02/01/2021    Lab Results  Component Value Date   CREATININE 2.03 (H) 02/01/2021    No results found for: "PSA"  No results found for: "TESTOSTERONE"  No results found for: "HGBA1C"  Urinalysis    Component Value Date/Time   COLORURINE YELLOW 06/26/2010 1000   APPEARANCEUR CLEAR 06/26/2010 1000   LABSPEC 1.007 06/26/2010 1000   PHURINE 6.5 06/26/2010 1000   HGBUR NEGATIVE 06/26/2010 1000   BILIRUBINUR NEGATIVE 06/26/2010 1000   KETONESUR NEGATIVE 06/26/2010 1000   PROTEINUR NEGATIVE 06/26/2010 1000   UROBILINOGEN 0.2 06/26/2010 1000   NITRITE NEGATIVE 06/26/2010 1000   LEUKOCYTESUR  06/26/2010 1000    NEGATIVE MICROSCOPIC NOT DONE ON URINES WITH NEGATIVE PROTEIN, BLOOD, LEUKOCYTES,  NITRITE, OR GLUCOSE <1000 mg/dL.    No results found for: "LABMICR", "WBCUA", "RBCUA", "LABEPIT", "MUCUS", "BACTERIA"  Pertinent Imaging:  Results for orders placed in visit on 10/18/21  Abdomen 1 view (KUB)  Narrative CLINICAL DATA:  Nephrolithiasis  EXAM: ABDOMEN - 1 VIEW  COMPARISON:  None Available.  FINDINGS: Nonobstructive bowel gas pattern. No free intraperitoneal gas. Large colonic stool burden. No definite nephro or urolithiasis. Mild vascular calcification noted within the pelvis. No organomegaly. Osseous structures are unremarkable.  IMPRESSION: No definite nephro or urolithiasis.  Large colonic stool burden.   Electronically Signed By: Fidela Salisbury M.D. On: 10/19/2021 21:30  No results found for this or any previous visit.  No results found for this or any previous visit.  No results found for this or any previous visit.  Results for orders placed during the hospital encounter of 11/27/21  Ultrasound renal complete  Narrative CLINICAL DATA:  hydronephrosis  EXAM: RENAL / URINARY TRACT ULTRASOUND COMPLETE  COMPARISON:  CT dated Sep 23, 2021; ultrasound in Sep 10, 2021  FINDINGS: Right Kidney:  Renal measurements: 8.9 x 5.2 x 4.6 cm = volume: 109 mL. Echogenicity within normal limits. Persistent severe hydronephrosis with mild cortical thinning. This persists postvoid. This is not significantly changed in comparison to prior ultrasound.  Left Kidney:  Renal measurements: 10.0 x 5.7 x 4.7 cm = volume: 141 mL. Echogenicity within normal limits. No mass or hydronephrosis visualized.  Bladder:  Decompressed and not visualized.  Other:  None.  IMPRESSION: Persistent severe RIGHT-sided hydronephrosis, similar in comparison to prior ultrasound and CT. This persists postvoid. There is mild cortical thinning.   Electronically Signed By: Valentino Saxon M.D. On: 11/27/2021 17:18  No results found for this or any previous  visit.  No results found for this or any previous visit.  Results for orders placed during the hospital encounter of 09/23/21  CT RENAL STONE STUDY  Narrative CLINICAL DATA:  Right flank pain. Right hydronephrosis. Chronic kidney disease.  EXAM: CT ABDOMEN AND PELVIS WITHOUT CONTRAST  TECHNIQUE: Multidetector CT imaging of the abdomen and pelvis was performed following the standard protocol without IV contrast.  RADIATION DOSE REDUCTION: This exam was performed according to the departmental dose-optimization program which includes automated exposure control, adjustment of the mA and/or kV according to patient size and/or use of iterative reconstruction technique.  COMPARISON:  Ultrasound on 09/10/2021  FINDINGS: Lower chest: No acute findings. 6 mm pulmonary nodule in the lateral right lower lobe remains stable since earlier chest CT in 2020, consistent with benign etiology. Emphysema again noted in both lung bases.  Hepatobiliary: No mass visualized on this unenhanced exam. Gallbladder is unremarkable. No evidence of biliary ductal dilatation.  Pancreas: No mass or inflammatory process visualized on this unenhanced exam.  Spleen:  Within normal limits in size.  Adrenals/Urinary tract: Moderate diffuse right renal atrophy is seen. Moderate right hydroureteronephrosis is seen, with a 2 mm calculus in the region of right UVJ. Another small calculus is seen in the bladder in the region of the left UVJ, however there is no evidence of left-sided hydroureteronephrosis.  Stomach/Bowel: No evidence of obstruction, inflammatory process, or abnormal fluid collections.  Vascular/Lymphatic: No pathologically enlarged lymph nodes identified. No evidence of abdominal aortic aneurysm. Aortic atherosclerotic calcification incidentally noted.  Reproductive:  Mildly enlarged prostate.  Other:  None.  Musculoskeletal:  No suspicious bone lesions  identified.  IMPRESSION: Moderate right hydroureteronephrosis, with 2 mm calculus in bladder in region of right UVJ.  Moderate diffuse right renal atrophy.  Small bladder calculus in region of left UVJ, but no left-sided hydronephrosis  Mildly enlarged prostate.   Electronically Signed By: Marlaine Hind M.D. On: 09/24/2021 20:30   Assessment & Plan:    1. Bladder cancer -I will see him back in 3-4 weeks for cystoscopy  2. Hydronephrosis, unspecified hydronephrosis type -referral to interventional radiology for right nephroureteral stent placement    No follow-ups on file.  [name], MD  [facility] [place]

## 2021-12-11 NOTE — Progress Notes (Signed)
Fill and Pull Catheter Removal  Patient is present today for a catheter removal.  Patient was cleaned and prepped in a sterile fashion 19m of sterile water/ saline was instilled into the bladder when the patient felt the urge to urinate. Patient began to void through the syringe and back into water basin. 176mof water was then drained from the balloon.  A 22FR foley cath was removed from the bladder no complications were noted .  Patient was then given some time to void on their own.  Patient tolerated well.  Performed by: Kacia Halley LPN  Follow up/ Additional notes: Per MD note

## 2021-12-11 NOTE — Patient Instructions (Signed)

## 2021-12-19 ENCOUNTER — Other Ambulatory Visit: Payer: Self-pay | Admitting: Radiology

## 2021-12-19 DIAGNOSIS — N1330 Unspecified hydronephrosis: Secondary | ICD-10-CM

## 2021-12-19 NOTE — H&P (Signed)
Chief Complaint: Patient was seen in consultation today for right hydropnephrosis  at the request of [place] L  Referring Physician(s): [place]  Supervising Physician: Corrie Mckusick  Patient Status: [facility] - Out-pt  History of Present Illness: Dustin Walker is a 71 y.o. male with PMH of COPD, GERD and nephrolithiasis. Pt underwent transurethral resection of bladder tumor  12/06/21. Pt continues to have unspecified right hydronephrosis shown on US renal 11/10/21. Pt has been referred to IR for right PCN placement for hydronephrosis.   Past Medical History:  Diagnosis Date   Allergic rhinitis    Arthritis    BCC (basal cell carcinoma) 04/02/2015   left upper eyelid, 06/12/15 MOHs   Chronic pain    left hand, crush injury in 2013   COPD (chronic obstructive pulmonary disease) (HCC)    GERD (gastroesophageal reflux disease)    Headache    migraines   SCC (squamous cell carcinoma) 10/29/2015   well diff, upper mid bac (CX3, 5FU)    Past Surgical History:  Procedure Laterality Date   COLONOSCOPY  2010   Dr. Gala Romney: tubular adenoma removed. was due for surveillance in 2015.   COLONOSCOPY WITH PROPOFOL N/A 02/22/2018   Procedure: COLONOSCOPY WITH PROPOFOL;  Surgeon: Daneil Dolin, MD;  Location: [facility];  Service: Endoscopy;  Laterality: N/A;  12:00pm   CYSTOSCOPY Right 12/06/2021   Procedure: CYSTOSCOPY;  Surgeon: Cleon Gustin, MD;  Location: [facility];  Service: Urology;  Laterality: Right;   HAND SURGERY Right    middle finger   I & D EXTREMITY  06/19/2011   Procedure: IRRIGATION AND DEBRIDEMENT EXTREMITY;  Surgeon: Dennie Bible, MD;  Location: [place];  Service: Plastics;;   JOINT REPLACEMENT     knee   KNEE SURGERY Bilateral    6 times per knee and total knee replacement on the left   ORIF ULNAR FRACTURE  06/19/2011   Procedure: OPEN REDUCTION INTERNAL FIXATION (ORIF) ULNAR FRACTURE;  Surgeon: Dennie Bible, MD;  Location: [place];   Service: Plastics;  Laterality: Left;   SHOULDER SURGERY Right    SPINAL CORD STIMULATOR BATTERY EXCHANGE Left 02/11/2021   Procedure: REPLACEMENT PULSE GENERATOR LEFT FLANK (MEDTRONIC);  Surgeon: [name], MD;  Location: [facility];  Service: Neurosurgery;  Laterality: Left;  LOCAL W/ MAC   TRANSURETHRAL RESECTION OF BLADDER TUMOR N/A 12/06/2021   Procedure: TRANSURETHRAL RESECTION OF BLADDER TUMOR (TURBT);  Surgeon: Cleon Gustin, MD;  Location: [facility];  Service: Urology;  Laterality: N/A;    Allergies: Naproxen sodium and Codeine  Medications: Prior to Admission medications   Medication Sig Start Date End Date Taking? Authorizing Provider  albuterol (PROVENTIL HFA;VENTOLIN HFA) 108 (90 Base) MCG/ACT inhaler Inhale 2 puffs into the lungs every 6 (six) hours as needed for wheezing or shortness of breath. Patient taking differently: Inhale 2 puffs into the lungs every 6 (six) hours as needed for wheezing or shortness of breath. States not takling 06/30/18   Mikey Kirschner, MD  nortriptyline (PAMELOR) 50 MG capsule Take 2 capsules (100 mg total) by mouth at bedtime. 11/29/19   Lovena Le, Malena M, DO  NURTEC 75 MG TBDP Take 75 mg by mouth daily as needed (Migraine). 08/21/21   [provider]  oxyCODONE-acetaminophen (PERCOCET) 7.5-325 MG tablet Take 1 tablet by mouth See admin instructions. Take three to 4 times a day 12/06/21   Cleon Gustin, MD  pantoprazole (PROTONIX) 40 MG tablet Take 1 tablet (40 mg total)  by mouth daily. Patient taking differently: Take 40 mg by mouth every morning. 12/21/19   Erven Colla, DO  phenazopyridine (PYRIDIUM) 100 MG tablet Take 1 tablet (100 mg total) by mouth 3 (three) times daily as needed for pain. 12/09/21   McKenzie, Candee Furbish, MD  tamsulosin (FLOMAX) 0.4 MG CAPS capsule Take 1 capsule (0.4 mg total) by mouth daily. 10/10/21   McKenzie, Candee Furbish, MD  topiramate (TOPAMAX) 50 MG tablet Take 50 mg by mouth 2 (two) times daily. 12/05/20    [provider]     Family History  Problem Relation Age of Onset   Colon cancer Neg Hx     Social History   Socioeconomic History   Marital status: Married    Spouse name: Not on file   Number of children: Not on file   Years of education: Not on file   Highest education level: Not on file  Occupational History   Not on file  Tobacco Use   Smoking status: Every Day    Packs/day: 1.00    Years: 49.00    Total pack years: 49.00    Types: Cigarettes    Start date: 09/12/1962   Smokeless tobacco: Never  Vaping Use   Vaping Use: Never used  Substance and Sexual Activity   Alcohol use: Not Currently   Drug use: No   Sexual activity: Yes    Partners: Female  Other Topics Concern   Not on file  Social History Narrative   Not on file   Social Determinants of Health   Financial Resource Strain: Not on file  Food Insecurity: Not on file  Transportation Needs: Not on file  Physical Activity: Not on file  Stress: Not on file  Social Connections: Not on file     Review of Systems: A 12 point ROS discussed and pertinent positives are indicated in the HPI above.  All other systems are negative.  Review of Systems  Constitutional:  Negative for appetite change, chills, fatigue and fever.  Respiratory:  Negative for shortness of breath.   Cardiovascular:  Negative for chest pain and leg swelling.  Gastrointestinal:  Negative for abdominal pain, nausea and vomiting.  Genitourinary:  Positive for dysuria. Negative for flank pain and hematuria.  Musculoskeletal:  Negative for back pain.  Neurological:  Negative for dizziness, weakness and headaches.    Vital Signs: BP 101/68   Pulse 64   Temp 98.2 F (36.8 C) (Oral)   Resp 16   Ht _0  (1.727 m)   Wt 117 lb (53.1 kg)   BMI 17.79 kg/m    Physical Exam Vitals reviewed.  Constitutional:      General: He is not in acute distress.    Appearance: Normal appearance. He is not ill-appearing.  HENT:     Head:  Normocephalic and atraumatic.     Mouth/Throat:     Mouth: Mucous membranes are dry.     Pharynx: Oropharynx is clear.  Eyes:     Extraocular Movements: Extraocular movements intact.     Pupils: Pupils are equal, round, and reactive to light.  Cardiovascular:     Rate and Rhythm: Normal rate and regular rhythm.     Pulses: Normal pulses.     Heart sounds: Normal heart sounds.  Pulmonary:     Effort: Pulmonary effort is normal. No respiratory distress.     Breath sounds: Normal breath sounds.  Abdominal:     General: Bowel sounds are normal. There is  no distension.     Palpations: Abdomen is soft.     Tenderness: There is no abdominal tenderness. There is no guarding.  Musculoskeletal:     Right lower leg: No edema.     Left lower leg: No edema.  Skin:    General: Skin is warm and dry.  Neurological:     Mental Status: He is alert and oriented to person, place, and time.  Psychiatric:        Mood and Affect: Mood normal.        Behavior: Behavior normal.        Thought Content: Thought content normal.        Judgment: Judgment normal.     Imaging: DG C-Arm 1-60 Min-No Report  Result Date: 12/06/2021 Fluoroscopy was utilized by the requesting physician.  No radiographic interpretation.   Ultrasound renal complete  Result Date: 11/27/2021 CLINICAL DATA:  hydronephrosis EXAM: RENAL / URINARY TRACT ULTRASOUND COMPLETE COMPARISON:  CT dated Sep 23, 2021; ultrasound in Sep 10, 2021 FINDINGS: Right Kidney: Renal measurements: 8.9 x 5.2 x 4.6 cm = volume: 109 mL. Echogenicity within normal limits. Persistent severe hydronephrosis with mild cortical thinning. This persists postvoid. This is not significantly changed in comparison to prior ultrasound. Left Kidney: Renal measurements: 10.0 x 5.7 x 4.7 cm = volume: 141 mL. Echogenicity within normal limits. No mass or hydronephrosis visualized. Bladder: Decompressed and not visualized. Other: None. IMPRESSION: Persistent severe RIGHT-sided  hydronephrosis, similar in comparison to prior ultrasound and CT. This persists postvoid. There is mild cortical thinning. Electronically Signed   By: Valentino Saxon M.D.   On: 11/27/2021 17:18    Labs:  CBC: Recent Labs    02/01/21 1048  WBC 6.3  HGB 13.8  HCT 41.0  PLT 204    COAGS: Recent Labs    02/01/21 1048  INR 1.1  APTT 34    BMP: Recent Labs    02/01/21 1048  NA 136  K 4.2  CL 107  CO2 23  GLUCOSE 91  BUN 32*  CALCIUM 8.7*  CREATININE 2.03*  GFRNONAA 35*    LIVER FUNCTION TESTS: No results for input(s): "BILITOT", "AST", "ALT", "ALKPHOS", "PROT", "ALBUMIN" in the last 8760 hours.  TUMOR MARKERS: No results for input(s): "AFPTM", "CEA", "CA199", "CHROMGRNA" in the last 8760 hours.  Assessment and Plan: History of COPD, GERD and nephrolithiasis. Pt underwent transurethral resection of bladder tumor  12/06/21. Pt continues to have unspecified right hydronephrosis shown on US renal 11/10/21. Pt has been referred to IR for right PCN placement for hydronephrosis.   Pt noted to be resting on stretcher.  He is A&O. He is in no distress.  Pt is NPO per order.  He denies the use of blood thinning medications.   Risks and benefits of right PCN placement was discussed with the patient including, but not limited to, infection, bleeding, significant bleeding causing loss or decrease in renal function or damage to adjacent structures.   All of the patient's questions were answered, patient is agreeable to proceed.  Consent signed and in chart.   Thank you for this interesting consult.  I greatly enjoyed meeting JERARDO COSTABILE and look forward to participating in their care.  A copy of this report was sent to the requesting provider on this date.  Electronically Signed: Tyson Alias, NP 12/20/2021, 10:00 AM   I spent a total of 20 minutes in face to face in clinical consultation, greater than 50% of which was  counseling/coordinating care for right  hydronephrosis.

## 2021-12-20 ENCOUNTER — Ambulatory Visit (HOSPITAL_COMMUNITY)
Admission: RE | Admit: 2021-12-20 | Discharge: 2021-12-20 | Disposition: A | Discharge status: Home / Self Care | Payer: Medicare Other | Source: Ambulatory Visit | Attending: Urology | Admitting: Urology

## 2021-12-20 ENCOUNTER — Other Ambulatory Visit: Payer: Self-pay

## 2021-12-20 ENCOUNTER — Encounter (HOSPITAL_COMMUNITY): Payer: Self-pay

## 2021-12-20 DIAGNOSIS — Z436 Encounter for attention to other artificial openings of urinary tract: Secondary | ICD-10-CM | POA: Insufficient documentation

## 2021-12-20 DIAGNOSIS — F17210 Nicotine dependence, cigarettes, uncomplicated: Secondary | ICD-10-CM | POA: Diagnosis not present

## 2021-12-20 DIAGNOSIS — N1330 Unspecified hydronephrosis: Secondary | ICD-10-CM | POA: Insufficient documentation

## 2021-12-20 DIAGNOSIS — K219 Gastro-esophageal reflux disease without esophagitis: Secondary | ICD-10-CM | POA: Insufficient documentation

## 2021-12-20 DIAGNOSIS — J449 Chronic obstructive pulmonary disease, unspecified: Secondary | ICD-10-CM | POA: Diagnosis not present

## 2021-12-20 HISTORY — PX: IR NEPHROSTOMY PLACEMENT RIGHT: IMG6064

## 2021-12-20 LAB — CBC WITH DIFFERENTIAL/PLATELET
Abs Immature Granulocytes: 0.02 10*3/uL (ref 0.00–0.07)
Basophils Absolute: 0.1 10*3/uL (ref 0.0–0.1)
Basophils Relative: 1 %
Eosinophils Absolute: 0.2 10*3/uL (ref 0.0–0.5)
Eosinophils Relative: 3 %
HCT: 41.7 % (ref 39.0–52.0)
Hemoglobin: 13.3 g/dL (ref 13.0–17.0)
Immature Granulocytes: 0 %
Lymphocytes Relative: 23 %
Lymphs Abs: 1.5 10*3/uL (ref 0.7–4.0)
MCH: 31 pg (ref 26.0–34.0)
MCHC: 31.9 g/dL (ref 30.0–36.0)
MCV: 97.2 fL (ref 80.0–100.0)
Monocytes Absolute: 0.5 10*3/uL (ref 0.1–1.0)
Monocytes Relative: 7 %
Neutro Abs: 4.3 10*3/uL (ref 1.7–7.7)
Neutrophils Relative %: 66 %
Platelets: 285 10*3/uL (ref 150–400)
RBC: 4.29 MIL/uL (ref 4.22–5.81)
RDW: 13.4 % (ref 11.5–15.5)
WBC: 6.5 10*3/uL (ref 4.0–10.5)
nRBC: 0 % (ref 0.0–0.2)

## 2021-12-20 LAB — BASIC METABOLIC PANEL
Anion gap: 6 (ref 5–15)
BUN: 31 mg/dL — ABNORMAL HIGH (ref 8–23)
CO2: 21 mmol/L — ABNORMAL LOW (ref 22–32)
Calcium: 8.6 mg/dL — ABNORMAL LOW (ref 8.9–10.3)
Chloride: 113 mmol/L — ABNORMAL HIGH (ref 98–111)
Creatinine, Ser: 1.82 mg/dL — ABNORMAL HIGH (ref 0.61–1.24)
GFR, Estimated: 40 mL/min — ABNORMAL LOW (ref >60)
Glucose, Bld: 102 mg/dL — ABNORMAL HIGH (ref 70–99)
Potassium: 4.1 mmol/L (ref 3.5–5.1)
Sodium: 140 mmol/L (ref 135–145)

## 2021-12-20 LAB — PROTIME-INR
INR: 1 (ref 0.8–1.2)
Prothrombin Time: 12.9 seconds (ref 11.4–15.2)

## 2021-12-20 MED ORDER — SODIUM CHLORIDE 0.9 % IV SOLN
2.0000 g | Freq: Once | INTRAVENOUS | Status: AC
  Administered 2021-12-20: 2 g via INTRAVENOUS
  Filled 2021-12-20: qty 20

## 2021-12-20 MED ORDER — FENTANYL CITRATE (PF) 100 MCG/2ML IJ SOLN
INTRAMUSCULAR | Status: AC
  Filled 2021-12-20: qty 2

## 2021-12-20 MED ORDER — SODIUM CHLORIDE 0.9% FLUSH: 5.0000 mL | Freq: Three times a day (TID) | INTRAVENOUS | Status: DC

## 2021-12-20 MED ORDER — IOHEXOL 300 MG/ML  SOLN
50.0000 mL | Freq: Once | INTRAMUSCULAR | Status: AC | PRN
  Administered 2021-12-20: 10 mL

## 2021-12-20 MED ORDER — MIDAZOLAM HCL 2 MG/2ML IJ SOLN
INTRAMUSCULAR | Status: AC | PRN
  Administered 2021-12-20 (×3): 1 mg via INTRAVENOUS

## 2021-12-20 MED ORDER — LIDOCAINE HCL 1 % IJ SOLN
INTRAMUSCULAR | Status: AC
  Filled 2021-12-20: qty 20

## 2021-12-20 MED ORDER — FENTANYL CITRATE (PF) 100 MCG/2ML IJ SOLN
INTRAMUSCULAR | Status: AC | PRN
  Administered 2021-12-20 (×2): 50 ug via INTRAVENOUS

## 2021-12-20 MED ORDER — SODIUM CHLORIDE 0.9 % IV SOLN: INTRAVENOUS | Status: DC

## 2021-12-20 MED ORDER — MIDAZOLAM HCL 2 MG/2ML IJ SOLN
INTRAMUSCULAR | Status: AC
  Filled 2021-12-20: qty 4

## 2021-12-20 NOTE — Procedures (Signed)
Interventional Radiology Procedure Note  Procedure: Image right PCN placement,  29F pigtail drain.  Complications: None  EBL: None    Recommendations: - Routine PCN care, with sterile flushes, record output - routine wound care - dc 1 hr  Signed,  Dulcy Fanny. Earleen Newport, DO

## 2021-12-20 NOTE — Discharge Instructions (Signed)
For questions /concerns may call Interventional Radiology at [phone] or  Interventional Radiology clinic [phone]   You may remove your dressing and shower tomorrow afternoon   Moderate Conscious Sedation, Adult, Care After This sheet gives you information about how to care for yourself after your procedure. Your health care provider may also give you more specific instructions. If you have problems or questions, contact your health careprovider. What can I expect after the procedure? After the procedure, it is common to have: Sleepiness for several hours. Impaired judgment for several hours. Difficulty with balance. Vomiting if you eat too soon. Follow these instructions at home: For the time period you were told by your health care provider: Rest. Do not participate in activities where you could fall or become injured. Do not drive or use machinery. Do not drink alcohol. Do not take sleeping pills or medicines that cause drowsiness. Do not make important decisions or sign legal documents. Do not take care of children on your own. Eating and drinking  Follow the diet recommended by your health care provider. Drink enough fluid to keep your urine pale yellow. If you vomit: Drink water, juice, or soup when you can drink without vomiting. Make sure you have little or no nausea before eating solid foods.  General instructions Take over-the-counter and prescription medicines only as told by your health care provider. Have a responsible adult stay with you for the time you are told. It is important to have someone help care for you until you are awake and alert. Do not smoke. Keep all follow-up visits as told by your health care provider. This is important. Contact a health care provider if: You are still sleepy or having trouble with balance after 24 hours. You feel light-headed. You keep feeling nauseous or you keep vomiting. You develop a rash. You have a fever. You  have redness or swelling around the IV site. Get help right away if: You have trouble breathing. You have new-onset confusion at home. Summary After the procedure, it is common to feel sleepy, have impaired judgment, or feel nauseous if you eat too soon. Rest after you get home. Know the things you should not do after the procedure. Follow the diet recommended by your health care provider and drink enough fluid to keep your urine pale yellow. Get help right away if you have trouble breathing or new-onset confusion at home. This information is not intended to replace advice given to you by your health care provider. Make sure you discuss any questions you have with your healthcare provider.    Percutaneous Nephrostomy, Care After This sheet gives you information about how to care for yourself after your procedure. Your health care provider may also give you more specific instructions. If you have problems or questions, contact your health care provider. What can I expect after the procedure? After the procedure, it is common to have: Some soreness where the nephrostomy tube was inserted (tube insertion site). Blood-tinged drainage from the nephrostomy tube for the first 24 hours. Follow these instructions at home: Activity  Do not lift anything that is heavier than 10 lb (4.5 kg), or the limit that you are told, until your health care provider says that it is safe. Return to your normal activities as told by your health care provider. Ask your health care provider what activities are safe for you. Avoid activities that may cause the nephrostomy tubing to bend. Do not take baths, swim, or use a hot tub until your  health care provider approves. Ask your health care provider if you can take showers. Cover the nephrostomy tube bandage (dressing) with a watertight covering when you take a shower. If you were given a sedative during the procedure, it can affect you for several hours. Do not drive  or operate machinery until your health care provider says that it is safe. Care of the tube insertion site  Follow instructions from your health care provider about how to take care of your tube insertion site. Make sure you: Wash your hands with soap and water for at least 20 seconds before you change your dressing. If soap and water are not available, use hand sanitizer. Change your dressing as told by your health care provider. Be careful not to pull on the tube while removing the dressing. When you change the dressing, wash the skin around the tube, rinse well, and pat the skin dry. Check the tube insertion area every day for signs of infection. Check for: Redness, swelling, or pain. Fluid or blood. Warmth. Pus or a bad smell. Care of the nephrostomy tube and drainage bag Always keep the tubing, the leg bag, or the bedside drainage bags below the level of the kidney so that your urine drains freely. When connecting your nephrostomy tube to a drainage bag, make sure that there are no kinks in the tubing and that your urine is draining freely. You may want to use an elastic bandage to wrap any exposed tubing that goes from the nephrostomy tube to any of the connecting tubes. At night, you may want to connect your nephrostomy tube or the leg bag to a larger bedside drainage bag. Follow instructions from your health care provider about how to empty or change the drainage bag. Empty the drainage bag when it becomes ? full. Replace the drainage bag and any extension tubing that is connected to your nephrostomy tube every 7 days or as told by your health care provider. Your health care provider will explain how to change the drainage bag and extension tubing. General instructions Take over-the-counter and prescription medicines only as told by your health care provider. Keep all follow-up visits as told by your health care provider. This is important. The nephrostomy tube will need to be changed  every 8-12 weeks. Contact a health care provider if: You have problems with any of the valves or tubing. You have persistent pain or soreness in your back. You have redness, swelling, or pain around your tube insertion site. You have fluid or blood coming from your tube insertion site. Your tube insertion site feels warm to the touch. You have pus or a bad smell coming from your tube insertion site. You have increased urine output or you feel burning when urinating. Get help right away if: You have pain in your abdomen during the first week. You have chest pain or have trouble breathing. You have a new appearance of blood in your urine. You have a fever or chills. You have back pain that is not relieved by your medicine. You have decreased urine output. Your nephrostomy tube comes out. Summary After the procedure, it is common to have some soreness where the nephrostomy tube was inserted (tube insertion site). Follow instructions from your health care provider about how to take care of your tube insertion site, nephrostomy tube, and drainage bag. Keep all follow-up visits for care and for changing the tube. This information is not intended to replace advice given to you by your health care  provider. Make sure you discuss any questions you have with your health care provider.

## 2021-12-27 ENCOUNTER — Telehealth: Payer: Self-pay

## 2021-12-27 NOTE — Telephone Encounter (Signed)
Tried calling patient with no answer and left message for patient to call our office back.

## 2021-12-30 ENCOUNTER — Telehealth: Payer: Self-pay

## 2021-12-30 NOTE — Telephone Encounter (Signed)
Patient is having a pinkish red drainage from his tube.  He is asking if this is normal.  Per Sharee Pimple it is normal to have this type of drainage after having the nephrostomy tube placed.  Patient also states that his wife got queasy after changing his dressing so it has not been changed since  12/20/21.  Scheduled patient for a nurse visit for a dressing change tomorrow.

## 2021-12-31 ENCOUNTER — Ambulatory Visit: Payer: Medicare Other

## 2022-01-02 NOTE — Telephone Encounter (Signed)
Open error 

## 2022-01-15 ENCOUNTER — Ambulatory Visit: Payer: Medicare Other | Admitting: Urology

## 2022-01-15 ENCOUNTER — Encounter: Payer: Self-pay | Admitting: Urology

## 2022-01-15 VITALS — BP 113/68 | HR 74

## 2022-01-15 DIAGNOSIS — C679 Malignant neoplasm of bladder, unspecified: Secondary | ICD-10-CM | POA: Diagnosis not present

## 2022-01-15 LAB — URINALYSIS, ROUTINE W REFLEX MICROSCOPIC
Bilirubin, UA: NEGATIVE
Glucose, UA: NEGATIVE
Ketones, UA: NEGATIVE
Nitrite, UA: NEGATIVE
Specific Gravity, UA: 1.015 (ref 1.005–1.030)
Urobilinogen, Ur: 0.2 mg/dL (ref 0.2–1.0)
pH, UA: 6.5 (ref 5.0–7.5)

## 2022-01-15 LAB — MICROSCOPIC EXAMINATION
Epithelial Cells (non renal): NONE SEEN /hpf (ref 0–10)
Renal Epithel, UA: NONE SEEN /hpf

## 2022-01-15 MED ORDER — CIPROFLOXACIN HCL 500 MG PO TABS: 500.0000 mg | ORAL_TABLET | Freq: Once | ORAL | Status: DC

## 2022-01-15 NOTE — Progress Notes (Signed)
01/15/2022 3:18 PM   Dustin Walker 03-Apr-1951 [id number]  Referring provider: Dulce Sellar, MD [address] [place],  [place] [address]  Followup right hydronephrosis   HPI: [name] is a 71yo here for followup for bladder cancer and right hydronephrosis. He underwent right nephrostomy tube placement 12/20/2021. He has 300-500cc of urine output from the nephrostomy tube. He denies any pelvic pain. NO significant LUTS>    PMH: Past Medical History:  Diagnosis Date   Allergic rhinitis    Arthritis    BCC (basal cell carcinoma) 04/02/2015   left upper eyelid, 06/12/15 MOHs   Chronic pain    left hand, crush injury in 2013   COPD (chronic obstructive pulmonary disease) (HCC)    GERD (gastroesophageal reflux disease)    Headache    migraines   SCC (squamous cell carcinoma) 10/29/2015   well diff, upper mid bac (CX3, 5FU)    Surgical History: Past Surgical History:  Procedure Laterality Date   COLONOSCOPY  2010   Dr. Gala Romney: tubular adenoma removed. was due for surveillance in 2015.   COLONOSCOPY WITH PROPOFOL N/A 02/22/2018   Procedure: COLONOSCOPY WITH PROPOFOL;  Surgeon: Daneil Dolin, MD;  [facility];  Service: Endoscopy;  Laterality: N/A;  12:00pm   CYSTOSCOPY Right 12/06/2021   Procedure: CYSTOSCOPY;  Surgeon: Cleon Gustin, MD;  [facility];  Service: Urology;  Laterality: Right;   HAND SURGERY Right    middle finger   I & D EXTREMITY  06/19/2011   Procedure: IRRIGATION AND DEBRIDEMENT EXTREMITY;  Surgeon: Dennie Bible, MD;  Location: [facility];  Service: Plastics;;   IR NEPHROSTOMY PLACEMENT RIGHT  12/20/2021   JOINT REPLACEMENT     knee   KNEE SURGERY Bilateral    6 times per knee and total knee replacement on the left   ORIF ULNAR FRACTURE  06/19/2011   Procedure: OPEN REDUCTION INTERNAL FIXATION (ORIF) ULNAR FRACTURE;  Surgeon: Dennie Bible, MD;  Location: [facility];  Service: Plastics;  Laterality: Left;   SHOULDER  SURGERY Right    SPINAL CORD STIMULATOR BATTERY EXCHANGE Left 02/11/2021   Procedure: REPLACEMENT PULSE GENERATOR LEFT FLANK (MEDTRONIC);  Surgeon: [name], MD;  Location: [facility];  Service: Neurosurgery;  Laterality: Left;  LOCAL W/ MAC   TRANSURETHRAL RESECTION OF BLADDER TUMOR N/A 12/06/2021   Procedure: TRANSURETHRAL RESECTION OF BLADDER TUMOR (TURBT);  Surgeon: Cleon Gustin, MD;  [facility];  Service: Urology;  Laterality: N/A;    Home Medications:  Allergies as of 01/15/2022       Reactions   Naproxen Sodium Rash, Other (See Comments)   "Burns from inside out"   Codeine Rash, Other (See Comments)   Burning        Medication List        Accurate as of January 15, 2022  3:18 PM. If you have any questions, ask your nurse or doctor.          albuterol 108 (90 Base) MCG/ACT inhaler Commonly known as: VENTOLIN HFA Inhale 2 puffs into the lungs every 6 (six) hours as needed for wheezing or shortness of breath. What changed: additional instructions   nortriptyline 50 MG capsule Commonly known as: PAMELOR Take 2 capsules (100 mg total) by mouth at bedtime.   Nurtec 75 MG Tbdp Generic drug: Rimegepant Sulfate Take 75 mg by mouth daily as needed (Migraine).   oxyCODONE-acetaminophen 7.5-325 MG tablet Commonly known as: PERCOCET Take 1 tablet by mouth  See admin instructions. Take three to 4 times a day   pantoprazole 40 MG tablet Commonly known as: PROTONIX Take 1 tablet (40 mg total) by mouth daily. What changed: when to take this   phenazopyridine 100 MG tablet Commonly known as: Pyridium Take 1 tablet (100 mg total) by mouth 3 (three) times daily as needed for pain.   tamsulosin 0.4 MG Caps capsule Commonly known as: FLOMAX Take 1 capsule (0.4 mg total) by mouth daily.   topiramate 50 MG tablet Commonly known as: TOPAMAX Take 50 mg by mouth 2 (two) times daily.        Allergies:  Allergies  Allergen Reactions   Naproxen Sodium Rash  and Other (See Comments)    "Burns from inside out"   Codeine Rash and Other (See Comments)    Burning    Family History: Family History  Problem Relation Age of Onset   Colon cancer Neg Hx     Social History:  reports that he has been smoking cigarettes. He started smoking about 59 years ago. He has a 49.00 pack-year smoking history. He has never used smokeless tobacco. He reports that he does not currently use alcohol. He reports that he does not use drugs.  ROS: All other review of systems were reviewed and are negative except what is noted above in HPI  Physical Exam: BP 113/68   Pulse 74   Constitutional:  Alert and oriented, No acute distress. HEENT: [place] AT, moist mucus membranes.  Trachea midline, no masses. Cardiovascular: No clubbing, cyanosis, or edema. Respiratory: Normal respiratory effort, no increased work of breathing. GI: Abdomen is soft, nontender, nondistended, no abdominal masses GU: No CVA tenderness.  Lymph: No cervical or inguinal lymphadenopathy. Skin: No rashes, bruises or suspicious lesions. Neurologic: Grossly intact, no focal deficits, moving all 4 extremities. Psychiatric: Normal mood and affect.  Laboratory Data: Lab Results  Component Value Date   WBC 6.5 12/20/2021   HGB 13.3 12/20/2021   HCT 41.7 12/20/2021   MCV 97.2 12/20/2021   PLT 285 12/20/2021    Lab Results  Component Value Date   CREATININE 1.82 (H) 12/20/2021    No results found for: "PSA"  No results found for: "TESTOSTERONE"  No results found for: "HGBA1C"  Urinalysis    Component Value Date/Time   COLORURINE YELLOW 06/26/2010 1000   APPEARANCEUR CLEAR 06/26/2010 1000   LABSPEC 1.007 06/26/2010 1000   PHURINE 6.5 06/26/2010 1000   HGBUR NEGATIVE 06/26/2010 1000   BILIRUBINUR NEGATIVE 06/26/2010 1000   KETONESUR NEGATIVE 06/26/2010 1000   PROTEINUR NEGATIVE 06/26/2010 1000   UROBILINOGEN 0.2 06/26/2010 1000   NITRITE NEGATIVE 06/26/2010 1000   LEUKOCYTESUR   06/26/2010 1000    NEGATIVE MICROSCOPIC NOT DONE ON URINES WITH NEGATIVE PROTEIN, BLOOD, LEUKOCYTES, NITRITE, OR GLUCOSE <1000 mg/dL.    No results found for: "LABMICR", "WBCUA", "RBCUA", "LABEPIT", "MUCUS", "BACTERIA"  Pertinent Imaging:  Results for orders placed in visit on 10/18/21  Abdomen 1 view (KUB)  Narrative CLINICAL DATA:  Nephrolithiasis  EXAM: ABDOMEN - 1 VIEW  COMPARISON:  None Available.  FINDINGS: Nonobstructive bowel gas pattern. No free intraperitoneal gas. Large colonic stool burden. No definite nephro or urolithiasis. Mild vascular calcification noted within the pelvis. No organomegaly. Osseous structures are unremarkable.  IMPRESSION: No definite nephro or urolithiasis.  Large colonic stool burden.   Electronically Signed By: Fidela Salisbury M.D. On: 10/19/2021 21:30  No results found for this or any previous visit.  No results found for this  or any previous visit.  No results found for this or any previous visit.  Results for orders placed during the hospital encounter of 11/27/21  Ultrasound renal complete  Narrative CLINICAL DATA:  hydronephrosis  EXAM: RENAL / URINARY TRACT ULTRASOUND COMPLETE  COMPARISON:  CT dated Sep 23, 2021; ultrasound in Sep 10, 2021  FINDINGS: Right Kidney:  Renal measurements: 8.9 x 5.2 x 4.6 cm = volume: 109 mL. Echogenicity within normal limits. Persistent severe hydronephrosis with mild cortical thinning. This persists postvoid. This is not significantly changed in comparison to prior ultrasound.  Left Kidney:  Renal measurements: 10.0 x 5.7 x 4.7 cm = volume: 141 mL. Echogenicity within normal limits. No mass or hydronephrosis visualized.  Bladder:  Decompressed and not visualized.  Other:  None.  IMPRESSION: Persistent severe RIGHT-sided hydronephrosis, similar in comparison to prior ultrasound and CT. This persists postvoid. There is mild cortical thinning.   Electronically  Signed By: Valentino Saxon M.D. On: 11/27/2021 17:18  No results found for this or any previous visit.  No results found for this or any previous visit.  Results for orders placed during the hospital encounter of 09/23/21  CT RENAL STONE STUDY  Narrative CLINICAL DATA:  Right flank pain. Right hydronephrosis. Chronic kidney disease.  EXAM: CT ABDOMEN AND PELVIS WITHOUT CONTRAST  TECHNIQUE: Multidetector CT imaging of the abdomen and pelvis was performed following the standard protocol without IV contrast.  RADIATION DOSE REDUCTION: This exam was performed according to the departmental dose-optimization program which includes automated exposure control, adjustment of the mA and/or kV according to patient size and/or use of iterative reconstruction technique.  COMPARISON:  Ultrasound on 09/10/2021  FINDINGS: Lower chest: No acute findings. 6 mm pulmonary nodule in the lateral right lower lobe remains stable since earlier chest CT in 2020, consistent with benign etiology. Emphysema again noted in both lung bases.  Hepatobiliary: No mass visualized on this unenhanced exam. Gallbladder is unremarkable. No evidence of biliary ductal dilatation.  Pancreas: No mass or inflammatory process visualized on this unenhanced exam.  Spleen:  Within normal limits in size.  Adrenals/Urinary tract: Moderate diffuse right renal atrophy is seen. Moderate right hydroureteronephrosis is seen, with a 2 mm calculus in the region of right UVJ. Another small calculus is seen in the bladder in the region of the left UVJ, however there is no evidence of left-sided hydroureteronephrosis.  Stomach/Bowel: No evidence of obstruction, inflammatory process, or abnormal fluid collections.  Vascular/Lymphatic: No pathologically enlarged lymph nodes identified. No evidence of abdominal aortic aneurysm. Aortic atherosclerotic calcification incidentally noted.  Reproductive:  Mildly enlarged  prostate.  Other:  None.  Musculoskeletal:  No suspicious bone lesions identified.  IMPRESSION: Moderate right hydroureteronephrosis, with 2 mm calculus in bladder in region of right UVJ.  Moderate diffuse right renal atrophy.  Small bladder calculus in region of left UVJ, but no left-sided hydronephrosis  Mildly enlarged prostate.   Electronically Signed By: Marlaine Hind M.D. On: 09/24/2021 20:30   Assessment & Plan:    1. Malignant neoplasm of urinary bladder, unspecified site [facility]) -patient to get nephroureteral stent placed.  -BMP today -RTC 4 weeks for cystoscopy - Urinalysis, Routine w reflex microscopic   No follow-ups on file.  [name], MD  [facility] [place]

## 2022-01-27 ENCOUNTER — Other Ambulatory Visit (HOSPITAL_COMMUNITY): Payer: Medicare Other

## 2022-02-03 ENCOUNTER — Other Ambulatory Visit: Payer: Self-pay | Admitting: Radiology

## 2022-02-03 DIAGNOSIS — C679 Malignant neoplasm of bladder, unspecified: Secondary | ICD-10-CM

## 2022-02-04 ENCOUNTER — Other Ambulatory Visit (HOSPITAL_COMMUNITY): Payer: Self-pay | Admitting: Physician Assistant
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
## 2022-02-04 NOTE — H&P (Signed)
Chief Complaint: Patient was seen in consultation today for right hydronephrosis, neoplasm of urinary bladder at the request of DustinPatrick Walker  Referring Physician(s): DustinPatrick Walker  Supervising Physician: Simonne Come  Patient Status: [facility] - Out-pt  History of Present Illness: Dustin Walker is a 71 y.o. male with history of malignant neoplasm of urinary bladder and right hydronephrosis.  Patient is well-known IR service having right PCN placed 12/20/2021.  Patient recently followed up with Dr. Ronne Walker, urology that found patient has had good urine output from his nephrostomy tube without pelvic pain or significant LUTS.  Patient has once again been referred to IR for right nephroureteral stent placement.  Past Medical History:  Diagnosis Date   Allergic rhinitis    Arthritis    BCC (basal cell carcinoma) 04/02/2015   left upper eyelid, 06/12/15 MOHs   Chronic pain    left hand, crush injury in 2013   COPD (chronic obstructive pulmonary disease) (HCC)    GERD (gastroesophageal reflux disease)    Headache    migraines   SCC (squamous cell carcinoma) 10/29/2015   well diff, upper mid bac (CX3, 5FU)    Past Surgical History:  Procedure Laterality Date   COLONOSCOPY  2010   Dr. Jena Walker: tubular adenoma removed. was due for surveillance in 2015.   COLONOSCOPY WITH PROPOFOL N/A 02/22/2018   Procedure: COLONOSCOPY WITH PROPOFOL;  Surgeon: Dustin Ade, MD;  Location: [facility] ENDO SUITE;  Service: Endoscopy;  Laterality: N/A;  12:00pm   CYSTOSCOPY Right 12/06/2021   Procedure: CYSTOSCOPY;  Surgeon: Dustin Gauze, MD;  Location: [facility] ORS;  Service: Urology;  Laterality: Right;   HAND SURGERY Right    middle finger   I & D EXTREMITY  06/19/2011   Procedure: IRRIGATION AND DEBRIDEMENT EXTREMITY;  Surgeon: Dustin Abraham, MD;  Location: [facility] OR;  Service: Plastics;;   IR NEPHROSTOMY PLACEMENT RIGHT  12/20/2021   JOINT REPLACEMENT     knee   KNEE SURGERY Bilateral    6  times per knee and total knee replacement on the left   ORIF ULNAR FRACTURE  06/19/2011   Procedure: OPEN REDUCTION INTERNAL FIXATION (ORIF) ULNAR FRACTURE;  Surgeon: Dustin Abraham, MD;  Location: [facility] OR;  Service: Plastics;  Laterality: Left;   SHOULDER SURGERY Right    SPINAL CORD STIMULATOR BATTERY EXCHANGE Left 02/11/2021   Procedure: REPLACEMENT PULSE GENERATOR LEFT FLANK (MEDTRONIC);  Surgeon: Dustin Chris, MD;  Location: [facility] ORS;  Service: Neurosurgery;  Laterality: Left;  LOCAL W/ MAC   TRANSURETHRAL RESECTION OF BLADDER TUMOR N/A 12/06/2021   Procedure: TRANSURETHRAL RESECTION OF BLADDER TUMOR (TURBT);  Surgeon: Dustin Gauze, MD;  Location: [facility] ORS;  Service: Urology;  Laterality: N/A;    Allergies: Naproxen sodium and Codeine  Medications: Prior to Admission medications   Medication Sig Start Date End Date Taking? Authorizing Provider  albuterol (PROVENTIL HFA;VENTOLIN HFA) 108 (90 Base) MCG/ACT inhaler Inhale 2 puffs into the lungs every 6 (six) hours as needed for wheezing or shortness of breath. Patient taking differently: Inhale 2 puffs into the lungs every 6 (six) hours as needed for wheezing or shortness of breath. States not takling 06/30/18   Merlyn Albert, MD  nortriptyline (PAMELOR) 50 MG capsule Take 2 capsules (100 mg total) by mouth at bedtime. 11/29/19   Ladona Walker, Dustin M, DO  NURTEC 75 MG TBDP Take 75 mg by mouth daily as needed (Migraine). 08/21/21   [provider]  oxyCODONE-acetaminophen (PERCOCET) 7.5-325 MG tablet Take  1 tablet by mouth See admin instructions. Take three to 4 times a day 12/06/21   Dustin Gauze, MD  pantoprazole (PROTONIX) 40 MG tablet Take 1 tablet (40 mg total) by mouth daily. Patient taking differently: Take 40 mg by mouth every morning. 12/21/19   Dustin Genta, DO  phenazopyridine (PYRIDIUM) 100 MG tablet Take 1 tablet (100 mg total) by mouth 3 (three) times daily as needed for pain. 12/09/21   Walker, Dustin Celeste, MD   tamsulosin (FLOMAX) 0.4 MG CAPS capsule Take 1 capsule (0.4 mg total) by mouth daily. 10/10/21   Walker, Dustin Celeste, MD  topiramate (TOPAMAX) 50 MG tablet Take 50 mg by mouth 2 (two) times daily. 12/05/20   [provider]     Family History  Problem Relation Age of Onset   Colon cancer Neg Hx     Social History   Socioeconomic History   Marital status: Married    Spouse name: Not on file   Number of children: Not on file   Years of education: Not on file   Highest education level: Not on file  Occupational History   Not on file  Tobacco Use   Smoking status: Every Day    Packs/day: 1.00    Years: 49.00    Total pack years: 49.00    Types: Cigarettes    Start date: 09/12/1962   Smokeless tobacco: Never  Vaping Use   Vaping Use: Never used  Substance and Sexual Activity   Alcohol use: Not Currently   Drug use: No   Sexual activity: Yes    Partners: Female  Other Topics Concern   Not on file  Social History Narrative   Not on file   Social Determinants of Health   Financial Resource Strain: Not on file  Food Insecurity: Not on file  Transportation Needs: Not on file  Physical Activity: Not on file  Stress: Not on file  Social Connections: Not on file     Review of Systems: A 12 point ROS discussed and pertinent positives are indicated in the HPI above.  All other systems are negative.  Review of Systems  Constitutional:  Negative for chills and fever.  Respiratory:  Negative for cough and shortness of breath.   Cardiovascular:  Negative for chest pain and leg swelling.  Gastrointestinal:  Negative for abdominal pain, nausea and vomiting.  Neurological:  Positive for headaches. Negative for dizziness.    Vital Signs: BP 99/68   Pulse 73   Temp 98.5 F (36.9 C) (Oral)   Resp 18   Ht 5\' 8"  (1.727 m)   Wt 118 lb (53.5 kg)   SpO2 100%   BMI 17.94 kg/m     Physical Exam Vitals reviewed.  Constitutional:      General: He is not in acute  distress.    Appearance: Normal appearance. He is not ill-appearing.  HENT:     Head: Normocephalic and atraumatic.     Mouth/Throat:     Mouth: Mucous membranes are dry.     Pharynx: Oropharynx is clear.  Eyes:     Extraocular Movements: Extraocular movements intact.     Pupils: Pupils are equal, round, and reactive to light.  Cardiovascular:     Rate and Rhythm: Normal rate and regular rhythm.     Pulses: Normal pulses.     Heart sounds: Normal heart sounds. No murmur heard. Pulmonary:     Effort: Pulmonary effort is normal. No respiratory distress.  Breath sounds: Normal breath sounds.  Abdominal:     General: Bowel sounds are normal. There is no distension.     Palpations: Abdomen is soft.     Tenderness: There is no abdominal tenderness. There is no guarding.  Musculoskeletal:     Right lower leg: No edema.     Left lower leg: No edema.  Skin:    General: Skin is warm and dry.  Neurological:     Mental Status: He is alert and oriented to person, place, and time.  Psychiatric:        Mood and Affect: Mood normal.        Behavior: Behavior normal.        Thought Content: Thought content normal.        Judgment: Judgment normal.     Imaging: No results found.  Labs:  CBC: Recent Labs    12/20/21 0945  WBC 6.5  HGB 13.3  HCT 41.7  PLT 285    COAGS: Recent Labs    12/20/21 0945  INR 1.0    BMP: Recent Labs    12/20/21 0945  NA 140  K 4.1  CL 113*  CO2 21*  GLUCOSE 102*  BUN 31*  CALCIUM 8.6*  CREATININE 1.82*  GFRNONAA 40*    LIVER FUNCTION TESTS: No results for input(s): "BILITOT", "AST", "ALT", "ALKPHOS", "PROT", "ALBUMIN" in the last 8760 hours.  TUMOR MARKERS: No results for input(s): "AFPTM", "CEA", "CA199", "CHROMGRNA" in the last 8760 hours.  Assessment and Plan:  71 yo male presents to IR for nephroureteral stent placement at the request of Dr. Ronne Walker.  Pt resting on stretcher,.  He is A&O and in no distress.  Pt states  he had black coffee prior to 7am this morning.   Risks and benefits of right nephroureteral stent placement was discussed with the patient including, but not limited to, infection, bleeding, significant bleeding causing loss or decrease in renal function or damage to adjacent structures.   All of the patient's questions were answered, patient is agreeable to proceed.  Consent signed and in chart.    Thank you for this interesting consult.  I greatly enjoyed meeting JAMIE BELGER and look forward to participating in their care.  A copy of this report was sent to the requesting provider on this date.  Electronically Signed: Shon Hough, NP 02/05/2022, 11:32 AM   I spent a total of 20 minutes in face to face in clinical consultation, greater than 50% of which was counseling/coordinating care for malignant neoplasm of urinary bladder with right hydronephrosis.

## 2022-02-05 ENCOUNTER — Other Ambulatory Visit: Payer: Self-pay | Admitting: Urology

## 2022-02-05 ENCOUNTER — Other Ambulatory Visit: Payer: Self-pay

## 2022-02-05 ENCOUNTER — Encounter (HOSPITAL_COMMUNITY): Payer: Self-pay

## 2022-02-05 ENCOUNTER — Ambulatory Visit (HOSPITAL_COMMUNITY)
Admission: RE | Admit: 2022-02-05 | Discharge: 2022-02-05 | Disposition: A | Discharge status: Home / Self Care | Payer: Medicare Other | Source: Ambulatory Visit | Attending: Urology | Admitting: Urology

## 2022-02-05 ENCOUNTER — Ambulatory Visit (HOSPITAL_COMMUNITY)
Admission: RE | Admit: 2022-02-05 | Discharge: 2022-02-05 | Disposition: A | Discharge status: Home / Self Care | Payer: Medicare Other | Source: Ambulatory Visit

## 2022-02-05 DIAGNOSIS — C679 Malignant neoplasm of bladder, unspecified: Secondary | ICD-10-CM

## 2022-02-05 DIAGNOSIS — N131 Hydronephrosis with ureteral stricture, not elsewhere classified: Secondary | ICD-10-CM | POA: Insufficient documentation

## 2022-02-05 DIAGNOSIS — Z436 Encounter for attention to other artificial openings of urinary tract: Secondary | ICD-10-CM | POA: Insufficient documentation

## 2022-02-05 HISTORY — PX: IR NEPHROSTOMY PLACEMENT RIGHT: IMG6064

## 2022-02-05 LAB — CBC WITH DIFFERENTIAL/PLATELET
Abs Immature Granulocytes: 0.01 10*3/uL (ref 0.00–0.07)
Basophils Absolute: 0.1 10*3/uL (ref 0.0–0.1)
Basophils Relative: 1 %
Eosinophils Absolute: 0.1 10*3/uL (ref 0.0–0.5)
Eosinophils Relative: 2 %
HCT: 44 % (ref 39.0–52.0)
Hemoglobin: 14 g/dL (ref 13.0–17.0)
Immature Granulocytes: 0 %
Lymphocytes Relative: 22 %
Lymphs Abs: 1.4 10*3/uL (ref 0.7–4.0)
MCH: 30.2 pg (ref 26.0–34.0)
MCHC: 31.8 g/dL (ref 30.0–36.0)
MCV: 95 fL (ref 80.0–100.0)
Monocytes Absolute: 0.4 10*3/uL (ref 0.1–1.0)
Monocytes Relative: 6 %
Neutro Abs: 4.5 10*3/uL (ref 1.7–7.7)
Neutrophils Relative %: 69 %
Platelets: 198 10*3/uL (ref 150–400)
RBC: 4.63 MIL/uL (ref 4.22–5.81)
RDW: 12.9 % (ref 11.5–15.5)
WBC: 6.5 10*3/uL (ref 4.0–10.5)
nRBC: 0 % (ref 0.0–0.2)

## 2022-02-05 LAB — BASIC METABOLIC PANEL
Anion gap: 6 (ref 5–15)
BUN: 32 mg/dL — ABNORMAL HIGH (ref 8–23)
CO2: 20 mmol/L — ABNORMAL LOW (ref 22–32)
Calcium: 8.6 mg/dL — ABNORMAL LOW (ref 8.9–10.3)
Chloride: 113 mmol/L — ABNORMAL HIGH (ref 98–111)
Creatinine, Ser: 2.06 mg/dL — ABNORMAL HIGH (ref 0.61–1.24)
GFR, Estimated: 34 mL/min — ABNORMAL LOW (ref >60)
Glucose, Bld: 110 mg/dL — ABNORMAL HIGH (ref 70–99)
Potassium: 4.5 mmol/L (ref 3.5–5.1)
Sodium: 139 mmol/L (ref 135–145)

## 2022-02-05 LAB — PROTIME-INR
INR: 1.1 (ref 0.8–1.2)
Prothrombin Time: 13.6 seconds (ref 11.4–15.2)

## 2022-02-05 MED ORDER — FENTANYL CITRATE (PF) 100 MCG/2ML IJ SOLN
INTRAMUSCULAR | Status: AC | PRN
  Administered 2022-02-05 (×3): 50 ug via INTRAVENOUS

## 2022-02-05 MED ORDER — FENTANYL CITRATE (PF) 100 MCG/2ML IJ SOLN
INTRAMUSCULAR | Status: AC
  Filled 2022-02-05: qty 2

## 2022-02-05 MED ORDER — MIDAZOLAM HCL 2 MG/2ML IJ SOLN
INTRAMUSCULAR | Status: AC | PRN
  Administered 2022-02-05 (×3): 1 mg via INTRAVENOUS

## 2022-02-05 MED ORDER — SODIUM CHLORIDE 0.9 % IV SOLN: INTRAVENOUS | Status: DC

## 2022-02-05 MED ORDER — SODIUM CHLORIDE 0.9 % IV SOLN
2.0000 g | Freq: Once | INTRAVENOUS | Status: AC
  Administered 2022-02-05: 2 g via INTRAVENOUS
  Filled 2022-02-05: qty 20

## 2022-02-05 MED ORDER — MIDAZOLAM HCL 2 MG/2ML IJ SOLN
INTRAMUSCULAR | Status: AC
  Filled 2022-02-05: qty 2

## 2022-02-05 MED ORDER — IOHEXOL 300 MG/ML  SOLN
50.0000 mL | Freq: Once | INTRAMUSCULAR | Status: AC | PRN
  Administered 2022-02-05: 22 mL

## 2022-02-05 MED ORDER — LIDOCAINE HCL 1 % IJ SOLN
INTRAMUSCULAR | Status: AC
  Administered 2022-02-05: 8 mL
  Filled 2022-02-05: qty 20

## 2022-02-05 NOTE — Procedures (Signed)
Pre Procedure Dx: Hydronephrosis; Ureteral obstruction Post Procedural Dx: Same  Successful fluoro guided conversion of existing right sided PCN to a right sided double J ureteral stent. Right PCN exchanged and capped for internalization trial.   EBL: None Complications: None immediate.  Ronny Bacon, MD Pager #: [phone]

## 2022-02-05 NOTE — Discharge Instructions (Addendum)
Discharge Instructions:   Please call Interventional Radiology clinic [phone] with any questions or concerns.  You may shower in 24 hours  You have a doctor's appointment February 10, 2022 at 3:30 p.m. with the Interventional Radiology doctor.   Starting 02/06/22 Change Daily at 5am, Do not use scissors near the catheter Call attending physician for temp greater than 101.5 degrees farenheit    Apply Merit drain Depot bag only if you are unable to pass your urine.   Moderate Conscious Sedation, Adult, Care After This sheet gives you information about how to care for yourself after your procedure. Your health care provider may also give you more specific instructions. If you have problems or questions, contact your health care provider. What can I expect after the procedure? After the procedure, it is common to have: Sleepiness for several hours. Impaired judgment for several hours. Difficulty with balance. Vomiting if you eat too soon. Follow these instructions at home: For the time period you were told by your health care provider: Rest. Do not participate in activities where you could fall or become injured. Do not drive or use machinery. Do not drink alcohol. Do not take sleeping pills or medicines that cause drowsiness. Do not make important decisions or sign legal documents. Do not take care of children on your own. Eating and drinking  Follow the diet recommended by your health care provider. Drink enough fluid to keep your urine pale yellow. If you vomit: Drink water, juice, or soup when you can drink without vomiting. Make sure you have little or no nausea before eating solid foods. General instructions Take over-the-counter and prescription medicines only as told by your health care provider. Have a responsible adult stay with you for the time you are told. It is important to have someone help care for you until you are awake and alert. Do not smoke. Keep all  follow-up visits as told by your health care provider. This is important. Contact a health care provider if: You are still sleepy or having trouble with balance after 24 hours. You feel light-headed. You keep feeling nauseous or you keep vomiting. You develop a rash. You have a fever. You have redness or swelling around the IV site. Get help right away if: You have trouble breathing. You have new-onset confusion at home. Summary After the procedure, it is common to feel sleepy, have impaired judgment, or feel nauseous if you eat too soon. Rest after you get home. Know the things you should not do after the procedure. Follow the diet recommended by your health care provider and drink enough fluid to keep your urine pale yellow. Get help right away if you have trouble breathing or new-onset confusion at home. This information is not intended to replace advice given to you by your health care provider. Make sure you discuss any questions you have with your health care provider. Document Revised: 08/26/2019 Document Reviewed: 03/24/2019 Elsevier Patient Education  [place].

## 2022-02-06 ENCOUNTER — Ambulatory Visit: Payer: Medicare Other | Admitting: Neurology

## 2022-02-06 ENCOUNTER — Encounter: Payer: Self-pay | Admitting: Neurology

## 2022-02-06 ENCOUNTER — Other Ambulatory Visit (INDEPENDENT_AMBULATORY_CARE_PROVIDER_SITE_OTHER): Payer: Medicare Other

## 2022-02-06 VITALS — BP 110/78 | HR 83 | Resp 18 | Ht 68.0 in | Wt 116.0 lb

## 2022-02-06 DIAGNOSIS — G8929 Other chronic pain: Secondary | ICD-10-CM

## 2022-02-06 DIAGNOSIS — M542 Cervicalgia: Secondary | ICD-10-CM | POA: Diagnosis not present

## 2022-02-06 DIAGNOSIS — R519 Headache, unspecified: Secondary | ICD-10-CM

## 2022-02-06 DIAGNOSIS — G8921 Chronic pain due to trauma: Secondary | ICD-10-CM | POA: Diagnosis not present

## 2022-02-06 LAB — C-REACTIVE PROTEIN: CRP: 1.3 mg/dL (ref 0.5–20.0)

## 2022-02-06 LAB — VITAMIN B12: Vitamin B-12: 486 pg/mL (ref 211–911)

## 2022-02-06 MED ORDER — METHYLPREDNISOLONE 4 MG PO TBPK: ORAL_TABLET | ORAL | 0 refills | Status: DC

## 2022-02-06 NOTE — Patient Instructions (Addendum)
I saw you today for headache. There are some things about your headache that is not typical for a migraine, so I want to do some further testing.  I would like to get labs today.  I would like to get a CT of your brain and an xray of your neck.  I think your neck pain and tightness may be contributing to your headaches. I am referring you to physical therapy to see if this can help.  I want you to continue your current medications (Nortriptyline, Topiramate, and Nurtec as needed) for now.  Limit the oxycodone and other pain meds as you are able as this may be contributing to rebound headaches.  I am sending a prescription to your pharmacy for a medrol dose pack (steroids). This is to try to break your current headache. If you finish this and your current headache still does not go away, I want you to send me a message or call the office. We may have to think about something else.  You should try some of the following over the counter vitamins as well, especially magnesium.  Vitamins and herbs that show potential  Magnesium: Magnesium (250 mg twice a day or 500 mg at bed) has a relaxant effect on smooth muscles such as blood vessels. Individuals suffering from frequent or daily headache usually have low magnesium levels which can be increase with daily supplementation of 400-750 mg. Three trials found 40-90% average headache reduction when used as a preventative. Magnesium also demonstrated the benefit in menstrually related migraine. Magnesium is part of the messenger system in the serotonin cascade and it is a good muscle relaxant. It is also useful for constipation which can be a side effect of other medications used to treat migraine. Good sources include nuts, whole grains, and tomatoes.  Riboflavin (vitamin B 2) 200 mg twice a day. This vitamin assists nerve cells in the production of ATP a principal energy storing molecule. It is necessary for many chemical reactions in the body. There  have been at least 3 clinical trials of riboflavin using 400 mg per day all of which suggested that migraine frequency can be decreased. All 3 trials showed significant improvement in over half of migraine sufferers. The supplement is found in bread, cereal, milk, meat, and poultry. Most Americans get more riboflavin than the recommended daily allowance, however riboflavin deficiency is not necessary for the supplements to help prevent headache.  Feverfew: Feverfew is a common garden herb native to [place] and popular in [place] as a treatment for disorders typically controlled by aspirin. The mechanism of action is unknown but is believed to be related to a chemical called parthenolide which helps the body use serotonin more effectively. Serotonin helps prevent migraine and assists with resolution when it occurs. Parthenolide also inhibits the release of histamine which is linked to pain and inflammation.  Consistency of active ingredients in different products can be a problem. Some formulations don't have the active ingredient (parthenolide) that prevents migraine. A parthenolide content of 0.2% is generally recommended. Typical dosage is one capsule 3 times a day.  Coenzyme Q10: This is present in almost all cells in the body and is critical component for the conversion of energy. Recent studies have shown that a nutritional supplement of CoQ10 can reduce the frequency of migraine attacks by improving the energy production of cells as with riboflavin. Doses of 150 mg twice a day have been shown to be effective.  Melatonin: Increasing evidence shows correlation between  melatonin secretion and headache conditions. Melatonin supplementation has decreased headache intensity and duration. It is widely used as a sleep aid. Sleep is natures way of dealing with migraine. A dose of 3 mg is recommended to start for headaches including cluster headache. Higher doses up to 15 mg has been reviewed for use in  Cluster headache and have been used. The rationale behind using melatonin for cluster is that many theories regarding the cause of Cluster headache center around the disruption of the normal circadian rhythm in the brain. This helps restore the normal circadian rhythm.  Ginger: Ginger has a small amount of antihistamine and anti-inflammatory action which may help headache. It is primarily used for nausea and may aid in the absorption of other medications.  I will be in touch when I have your lab results.   I want to see you back in about 1 month.  The physicians and staff at [facility] are committed to providing excellent care. You may receive a survey requesting feedback about your experience at our office. We strive to receive "very good" responses to the survey questions. If you feel that your experience would prevent you from giving the office a "very good " response, please contact our office to try to remedy the situation. We may be reached at [phone]. Thank you for taking the time out of your busy day to complete the survey.   Kai Levins, MD [facility]

## 2022-02-10 ENCOUNTER — Ambulatory Visit (HOSPITAL_COMMUNITY)
Admission: RE | Admit: 2022-02-10 | Discharge: 2022-02-10 | Disposition: A | Discharge status: Home / Self Care | Payer: Medicare Other | Source: Ambulatory Visit | Attending: Interventional Radiology | Admitting: Interventional Radiology

## 2022-02-10 DIAGNOSIS — C679 Malignant neoplasm of bladder, unspecified: Secondary | ICD-10-CM | POA: Insufficient documentation

## 2022-02-10 DIAGNOSIS — Z436 Encounter for attention to other artificial openings of urinary tract: Secondary | ICD-10-CM | POA: Insufficient documentation

## 2022-02-10 HISTORY — PX: IR NEPHROSTOGRAM RIGHT THRU EXISTING ACCESS: IMG6062

## 2022-02-10 MED ORDER — IOHEXOL 300 MG/ML  SOLN
50.0000 mL | Freq: Once | INTRAMUSCULAR | Status: AC | PRN
  Administered 2022-02-10: 5 mL

## 2022-02-12 ENCOUNTER — Ambulatory Visit: Payer: Medicare Other | Admitting: Urology

## 2022-02-12 VITALS — BP 104/69 | HR 76

## 2022-02-12 DIAGNOSIS — C679 Malignant neoplasm of bladder, unspecified: Secondary | ICD-10-CM

## 2022-02-12 DIAGNOSIS — N1330 Unspecified hydronephrosis: Secondary | ICD-10-CM | POA: Diagnosis not present

## 2022-02-12 NOTE — Progress Notes (Signed)
02/12/2022 3:26 PM   Dustin Walker 12-19-1950 [id number]  Referring provider: Dulce Sellar, MD [address] [place],  [place] [address]  Followup bladder cancer and right hydronephrosis   HPI: Dustin Walker is a 71yo here for followup for bladder cancer and right hydronephrosis. He underwent right internal ureteral stent placement by interventional radiology 10/2. He is happier with the sten tin place versus the nephrostomy tube. He denies any significant LUTS. No hematuria. No other complaints today   PMH: Past Medical History:  Diagnosis Date   Allergic rhinitis    Arthritis    BCC (basal cell carcinoma) 04/02/2015   left upper eyelid, 06/12/15 MOHs   Chronic pain    left hand, crush injury in 2013   COPD (chronic obstructive pulmonary disease) (HCC)    GERD (gastroesophageal reflux disease)    Headache    migraines   SCC (squamous cell carcinoma) 10/29/2015   well diff, upper mid bac (CX3, 5FU)    Surgical History: Past Surgical History:  Procedure Laterality Date   COLONOSCOPY  2010   Dr. Gala Romney: tubular adenoma removed. was due for surveillance in 2015.   COLONOSCOPY WITH PROPOFOL N/A 02/22/2018   Procedure: COLONOSCOPY WITH PROPOFOL;  Surgeon: Daneil Dolin, MD;  [facility];  Service: Endoscopy;  Laterality: N/A;  12:00pm   CYSTOSCOPY Right 12/06/2021   Procedure: CYSTOSCOPY;  Surgeon: Cleon Gustin, MD;  [facility]: [facility];  Service: Urology;  Laterality: Right;   HAND SURGERY Right    middle finger   I & D EXTREMITY  06/19/2011   Procedure: IRRIGATION AND DEBRIDEMENT EXTREMITY;  Surgeon: Dennie Bible, MD;  Location: [place];  Service: Plastics;;   IR NEPHROSTOGRAM RIGHT THRU EXISTING ACCESS  02/10/2022   IR NEPHROSTOMY PLACEMENT RIGHT  12/20/2021   IR NEPHROSTOMY PLACEMENT RIGHT  02/05/2022   JOINT REPLACEMENT     knee   KNEE SURGERY Bilateral    6 times per knee and total knee replacement on the left   ORIF ULNAR FRACTURE   06/19/2011   Procedure: OPEN REDUCTION INTERNAL FIXATION (ORIF) ULNAR FRACTURE;  Surgeon: Dennie Bible, MD;  Location: [facility];  Service: Plastics;  Laterality: Left;   SHOULDER SURGERY Right    SPINAL CORD STIMULATOR BATTERY EXCHANGE Left 02/11/2021   Procedure: REPLACEMENT PULSE GENERATOR LEFT FLANK (MEDTRONIC);  Surgeon: [name], MD;  Location: [facility];  Service: Neurosurgery;  Laterality: Left;  LOCAL W/ MAC   TRANSURETHRAL RESECTION OF BLADDER TUMOR N/A 12/06/2021   Procedure: TRANSURETHRAL RESECTION OF BLADDER TUMOR (TURBT);  Surgeon: Cleon Gustin, MD;  [facility];  Service: Urology;  Laterality: N/A;    Home Medications:  Allergies as of 02/12/2022       Reactions   Naproxen Sodium Rash, Other (See Comments)   "Burns from inside out"   Codeine Rash, Other (See Comments)   Burning        Medication List        Accurate as of February 12, 2022  3:26 PM. If you have any questions, ask your nurse or doctor.          albuterol 108 (90 Base) MCG/ACT inhaler Commonly known as: VENTOLIN HFA Inhale 2 puffs into the lungs every 6 (six) hours as needed for wheezing or shortness of breath. What changed: additional instructions   methylPREDNISolone 4 MG Tbpk tablet Commonly known as: MEDROL DOSEPAK Take as directed   nortriptyline 50 MG capsule Commonly known as: PAMELOR  Take 2 capsules (100 mg total) by mouth at bedtime.   Nurtec 75 MG Tbdp Generic drug: Rimegepant Sulfate Take 75 mg by mouth daily as needed (Migraine).   oxyCODONE-acetaminophen 7.5-325 MG tablet Commonly known as: PERCOCET Take 1 tablet by mouth See admin instructions. Take three to 4 times a day   pantoprazole 40 MG tablet Commonly known as: PROTONIX Take 1 tablet (40 mg total) by mouth daily. What changed: when to take this   phenazopyridine 100 MG tablet Commonly known as: Pyridium Take 1 tablet (100 mg total) by mouth 3 (three) times daily as needed for pain.   tamsulosin  0.4 MG Caps capsule Commonly known as: FLOMAX Take 1 capsule (0.4 mg total) by mouth daily.   topiramate 50 MG tablet Commonly known as: TOPAMAX Take 50 mg by mouth 2 (two) times daily.        Allergies:  Allergies  Allergen Reactions   Naproxen Sodium Rash and Other (See Comments)    "Burns from inside out"   Codeine Rash and Other (See Comments)    Burning    Family History: Family History  Problem Relation Age of Onset   Colon cancer Neg Hx     Social History:  reports that he has been smoking cigarettes. He started smoking about 59 years ago. He has a 49.00 pack-year smoking history. He has never used smokeless tobacco. He reports that he does not currently use alcohol. He reports that he does not use drugs.  ROS: All other review of systems were reviewed and are negative except what is noted above in HPI  Physical Exam: BP 104/69   Pulse 76   Constitutional:  Alert and oriented, No acute distress. HEENT: [place] AT, moist mucus membranes.  Trachea midline, no masses. Cardiovascular: No clubbing, cyanosis, or edema. Respiratory: Normal respiratory effort, no increased work of breathing. GI: Abdomen is soft, nontender, nondistended, no abdominal masses GU: No CVA tenderness.  Lymph: No cervical or inguinal lymphadenopathy. Skin: No rashes, bruises or suspicious lesions. Neurologic: Grossly intact, no focal deficits, moving all 4 extremities. Psychiatric: Normal mood and affect.  Laboratory Data: Lab Results  Component Value Date   WBC 6.5 02/05/2022   HGB 14.0 02/05/2022   HCT 44.0 02/05/2022   MCV 95.0 02/05/2022   PLT 198 02/05/2022    Lab Results  Component Value Date   CREATININE 2.06 (H) 02/05/2022    No results found for: "PSA"  No results found for: "TESTOSTERONE"  No results found for: "HGBA1C"  Urinalysis    Component Value Date/Time   COLORURINE YELLOW 06/26/2010 1000   APPEARANCEUR Clear 01/15/2022 1508   LABSPEC 1.007 06/26/2010 1000    PHURINE 6.5 06/26/2010 1000   GLUCOSEU Negative 01/15/2022 1508   HGBUR NEGATIVE 06/26/2010 1000   BILIRUBINUR Negative 01/15/2022 1508   KETONESUR NEGATIVE 06/26/2010 1000   PROTEINUR 1+ (A) 01/15/2022 1508   PROTEINUR NEGATIVE 06/26/2010 1000   UROBILINOGEN 0.2 06/26/2010 1000   NITRITE Negative 01/15/2022 1508   NITRITE NEGATIVE 06/26/2010 1000   LEUKOCYTESUR Trace (A) 01/15/2022 1508    Lab Results  Component Value Date   LABMICR See below: 01/15/2022   WBCUA 0-5 01/15/2022   LABEPIT None seen 01/15/2022   MUCUS Present 01/15/2022   BACTERIA Few (A) 01/15/2022    Pertinent Imaging:  Results for orders placed in visit on 10/18/21  Abdomen 1 view (KUB)  Narrative CLINICAL DATA:  Nephrolithiasis  EXAM: ABDOMEN - 1 VIEW  COMPARISON:  None Available.  FINDINGS: Nonobstructive bowel gas pattern. No free intraperitoneal gas. Large colonic stool burden. No definite nephro or urolithiasis. Mild vascular calcification noted within the pelvis. No organomegaly. Osseous structures are unremarkable.  IMPRESSION: No definite nephro or urolithiasis.  Large colonic stool burden.   Electronically Signed By: Fidela Salisbury M.D. On: 10/19/2021 21:30  No results found for this or any previous visit.  No results found for this or any previous visit.  No results found for this or any previous visit.  Results for orders placed during the hospital encounter of 11/27/21  Ultrasound renal complete  Narrative CLINICAL DATA:  hydronephrosis  EXAM: RENAL / URINARY TRACT ULTRASOUND COMPLETE  COMPARISON:  CT dated Sep 23, 2021; ultrasound in Sep 10, 2021  FINDINGS: Right Kidney:  Renal measurements: 8.9 x 5.2 x 4.6 cm = volume: 109 mL. Echogenicity within normal limits. Persistent severe hydronephrosis with mild cortical thinning. This persists postvoid. This is not significantly changed in comparison to prior ultrasound.  Left Kidney:  Renal measurements: 10.0 x  5.7 x 4.7 cm = volume: 141 mL. Echogenicity within normal limits. No mass or hydronephrosis visualized.  Bladder:  Decompressed and not visualized.  Other:  None.  IMPRESSION: Persistent severe RIGHT-sided hydronephrosis, similar in comparison to prior ultrasound and CT. This persists postvoid. There is mild cortical thinning.   Electronically Signed By: Valentino Saxon M.D. On: 11/27/2021 17:18  No valid procedures specified. No results found for this or any previous visit.  Results for orders placed during the hospital encounter of 09/23/21  CT RENAL STONE STUDY  Narrative CLINICAL DATA:  Right flank pain. Right hydronephrosis. Chronic kidney disease.  EXAM: CT ABDOMEN AND PELVIS WITHOUT CONTRAST  TECHNIQUE: Multidetector CT imaging of the abdomen and pelvis was performed following the standard protocol without IV contrast.  RADIATION DOSE REDUCTION: This exam was performed according to the departmental dose-optimization program which includes automated exposure control, adjustment of the mA and/or kV according to patient size and/or use of iterative reconstruction technique.  COMPARISON:  Ultrasound on 09/10/2021  FINDINGS: Lower chest: No acute findings. 6 mm pulmonary nodule in the lateral right lower lobe remains stable since earlier chest CT in 2020, consistent with benign etiology. Emphysema again noted in both lung bases.  Hepatobiliary: No mass visualized on this unenhanced exam. Gallbladder is unremarkable. No evidence of biliary ductal dilatation.  Pancreas: No mass or inflammatory process visualized on this unenhanced exam.  Spleen:  Within normal limits in size.  Adrenals/Urinary tract: Moderate diffuse right renal atrophy is seen. Moderate right hydroureteronephrosis is seen, with a 2 mm calculus in the region of right UVJ. Another small calculus is seen in the bladder in the region of the left UVJ, however there is no evidence of  left-sided hydroureteronephrosis.  Stomach/Bowel: No evidence of obstruction, inflammatory process, or abnormal fluid collections.  Vascular/Lymphatic: No pathologically enlarged lymph nodes identified. No evidence of abdominal aortic aneurysm. Aortic atherosclerotic calcification incidentally noted.  Reproductive:  Mildly enlarged prostate.  Other:  None.  Musculoskeletal:  No suspicious bone lesions identified.  IMPRESSION: Moderate right hydroureteronephrosis, with 2 mm calculus in bladder in region of right UVJ.  Moderate diffuse right renal atrophy.  Small bladder calculus in region of left UVJ, but no left-sided hydronephrosis  Mildly enlarged prostate.   Electronically Signed By: Marlaine Hind M.D. On: 09/24/2021 20:30   Assessment & Plan:    1. Malignant neoplasm of urinary bladder, unspecified site [facility]) -We will schedule for bladder tumor resection, right diagnostic  ureteroscopy and possible ureteral biopsy.  - Urinalysis, Routine w reflex microscopic   No follow-ups on file.  [name], MD  [facility] [place]

## 2022-02-12 NOTE — H&P (View-Only) (Signed)
 02/12/2022 3:26 PM   Dustin Walker 06/06/1950 [id number]  Referring provider: Kahoano, Haku K, MD [address] [place],  [place] [address]  Followup bladder cancer and right hydronephrosis   HPI: Dustin Walker is a 71yo here for followup for bladder cancer and right hydronephrosis. He underwent right internal ureteral stent placement by interventional radiology 10/2. He is happier with the sten tin place versus the nephrostomy tube. He denies any significant LUTS. No hematuria. No other complaints today   PMH: Past Medical History:  Diagnosis Date   Allergic rhinitis    Arthritis    BCC (basal cell carcinoma) 04/02/2015   left upper eyelid, 06/12/15 MOHs   Chronic pain    left hand, crush injury in 2013   COPD (chronic obstructive pulmonary disease) (HCC)    GERD (gastroesophageal reflux disease)    Headache    migraines   SCC (squamous cell carcinoma) 10/29/2015   well diff, upper mid bac (CX3, 5FU)    Surgical History: Past Surgical History:  Procedure Laterality Date   COLONOSCOPY  2010   Dr. Rourk: tubular adenoma removed. was due for surveillance in 2015.   COLONOSCOPY WITH PROPOFOL N/A 02/22/2018   Procedure: COLONOSCOPY WITH PROPOFOL;  Surgeon: Rourk, Georges M, MD;  [facility];  Service: Endoscopy;  Laterality: N/A;  12:00pm   CYSTOSCOPY Right 12/06/2021   Procedure: CYSTOSCOPY;  Surgeon: Izik Bingman L, MD;  Location[facility];  Service: Urology;  Laterality: Right;   HAND SURGERY Right    middle finger   I & D EXTREMITY  06/19/2011   Procedure: IRRIGATION AND DEBRIDEMENT EXTREMITY;  Surgeon: Harrill C Coley, MD;  [facility];  Service: Plastics;;   IR NEPHROSTOGRAM RIGHT THRU EXISTING ACCESS  02/10/2022   IR NEPHROSTOMY PLACEMENT RIGHT  12/20/2021   IR NEPHROSTOMY PLACEMENT RIGHT  02/05/2022   JOINT REPLACEMENT     knee   KNEE SURGERY Bilateral    6 times per knee and total knee replacement on the left   ORIF ULNAR FRACTURE   06/19/2011   Procedure: OPEN REDUCTION INTERNAL FIXATION (ORIF) ULNAR FRACTURE;  Surgeon: Harrill C Coley, MD;  Location[facility];  Service: Plastics;  Laterality: Left;   SHOULDER SURGERY Right    SPINAL CORD STIMULATOR BATTERY EXCHANGE Left 02/11/2021   Procedure: REPLACEMENT PULSE GENERATOR LEFT FLANK (MEDTRONIC);  Surgeon: Cook, Steven, MD;  Location: [facility];  Service: Neurosurgery;  Laterality: Left;  LOCAL W/ MAC   TRANSURETHRAL RESECTION OF BLADDER TUMOR N/A 12/06/2021   Procedure: TRANSURETHRAL RESECTION OF BLADDER TUMOR (TURBT);  Surgeon: Mohd. Derflinger L, MD;  Location[facility];  Service: Urology;  Laterality: N/A;    Home Medications:  Allergies as of 02/12/2022       Reactions   Naproxen Sodium Rash, Other (See Comments)   "Burns from inside out"   Codeine Rash, Other (See Comments)   Burning        Medication List        Accurate as of February 12, 2022  3:26 PM. If you have any questions, ask your nurse or doctor.          albuterol 108 (90 Base) MCG/ACT inhaler Commonly known as: VENTOLIN HFA Inhale 2 puffs into the lungs every 6 (six) hours as needed for wheezing or shortness of breath. What changed: additional instructions   methylPREDNISolone 4 MG Tbpk tablet Commonly known as: MEDROL DOSEPAK Take as directed   nortriptyline 50 MG capsule Commonly known as: PAMELOR   Take 2 capsules (100 mg total) by mouth at bedtime.   Nurtec 75 MG Tbdp Generic drug: Rimegepant Sulfate Take 75 mg by mouth daily as needed (Migraine).   oxyCODONE-acetaminophen 7.5-325 MG tablet Commonly known as: PERCOCET Take 1 tablet by mouth See admin instructions. Take three to 4 times a day   pantoprazole 40 MG tablet Commonly known as: PROTONIX Take 1 tablet (40 mg total) by mouth daily. What changed: when to take this   phenazopyridine 100 MG tablet Commonly known as: Pyridium Take 1 tablet (100 mg total) by mouth 3 (three) times daily as needed for pain.   tamsulosin  0.4 MG Caps capsule Commonly known as: FLOMAX Take 1 capsule (0.4 mg total) by mouth daily.   topiramate 50 MG tablet Commonly known as: TOPAMAX Take 50 mg by mouth 2 (two) times daily.        Allergies:  Allergies  Allergen Reactions   Naproxen Sodium Rash and Other (See Comments)    "Burns from inside out"   Codeine Rash and Other (See Comments)    Burning    Family History: Family History  Problem Relation Age of Onset   Colon cancer Neg Hx     Social History:  reports that he has been smoking cigarettes. He started smoking about 59 years ago. He has a 49.00 pack-year smoking history. He has never used smokeless tobacco. He reports that he does not currently use alcohol. He reports that he does not use drugs.  ROS: All other review of systems were reviewed and are negative except what is noted above in HPI  Physical Exam: BP 104/69   Pulse 76   Constitutional:  Alert and oriented, No acute distress. HEENT: [place] AT, moist mucus membranes.  Trachea midline, no masses. Cardiovascular: No clubbing, cyanosis, or edema. Respiratory: Normal respiratory effort, no increased work of breathing. GI: Abdomen is soft, nontender, nondistended, no abdominal masses GU: No CVA tenderness.  Lymph: No cervical or inguinal lymphadenopathy. Skin: No rashes, bruises or suspicious lesions. Neurologic: Grossly intact, no focal deficits, moving all 4 extremities. Psychiatric: Normal mood and affect.  Laboratory Data: Lab Results  Component Value Date   WBC 6.5 02/05/2022   HGB 14.0 02/05/2022   HCT 44.0 02/05/2022   MCV 95.0 02/05/2022   PLT 198 02/05/2022    Lab Results  Component Value Date   CREATININE 2.06 (H) 02/05/2022    No results found for: "PSA"  No results found for: "TESTOSTERONE"  No results found for: "HGBA1C"  Urinalysis    Component Value Date/Time   COLORURINE YELLOW 06/26/2010 1000   APPEARANCEUR Clear 01/15/2022 1508   LABSPEC 1.007 06/26/2010 1000    PHURINE 6.5 06/26/2010 1000   GLUCOSEU Negative 01/15/2022 1508   HGBUR NEGATIVE 06/26/2010 1000   BILIRUBINUR Negative 01/15/2022 1508   KETONESUR NEGATIVE 06/26/2010 1000   PROTEINUR 1+ (A) 01/15/2022 1508   PROTEINUR NEGATIVE 06/26/2010 1000   UROBILINOGEN 0.2 06/26/2010 1000   NITRITE Negative 01/15/2022 1508   NITRITE NEGATIVE 06/26/2010 1000   LEUKOCYTESUR Trace (A) 01/15/2022 1508    Lab Results  Component Value Date   LABMICR See below: 01/15/2022   WBCUA 0-5 01/15/2022   LABEPIT None seen 01/15/2022   MUCUS Present 01/15/2022   BACTERIA Few (A) 01/15/2022    Pertinent Imaging:  Results for orders placed in visit on 10/18/21  Abdomen 1 view (KUB)  Narrative CLINICAL DATA:  Nephrolithiasis  EXAM: ABDOMEN - 1 VIEW  COMPARISON:  None Available.    FINDINGS: Nonobstructive bowel gas pattern. No free intraperitoneal gas. Large colonic stool burden. No definite nephro or urolithiasis. Mild vascular calcification noted within the pelvis. No organomegaly. Osseous structures are unremarkable.  IMPRESSION: No definite nephro or urolithiasis.  Large colonic stool burden.   Electronically Signed By: Ashesh  Parikh M.D. On: 10/19/2021 21:30  No results found for this or any previous visit.  No results found for this or any previous visit.  No results found for this or any previous visit.  Results for orders placed during the hospital encounter of 11/27/21  Ultrasound renal complete  Narrative CLINICAL DATA:  hydronephrosis  EXAM: RENAL / URINARY TRACT ULTRASOUND COMPLETE  COMPARISON:  CT dated Sep 23, 2021; ultrasound in Sep 10, 2021  FINDINGS: Right Kidney:  Renal measurements: 8.9 x 5.2 x 4.6 cm = volume: 109 mL. Echogenicity within normal limits. Persistent severe hydronephrosis with mild cortical thinning. This persists postvoid. This is not significantly changed in comparison to prior ultrasound.  Left Kidney:  Renal measurements: 10.0 x  5.7 x 4.7 cm = volume: 141 mL. Echogenicity within normal limits. No mass or hydronephrosis visualized.  Bladder:  Decompressed and not visualized.  Other:  None.  IMPRESSION: Persistent severe RIGHT-sided hydronephrosis, similar in comparison to prior ultrasound and CT. This persists postvoid. There is mild cortical thinning.   Electronically Signed By: Stephanie  Peacock M.D. On: 11/27/2021 17:18  No valid procedures specified. No results found for this or any previous visit.  Results for orders placed during the hospital encounter of 09/23/21  CT RENAL STONE STUDY  Narrative CLINICAL DATA:  Right flank pain. Right hydronephrosis. Chronic kidney disease.  EXAM: CT ABDOMEN AND PELVIS WITHOUT CONTRAST  TECHNIQUE: Multidetector CT imaging of the abdomen and pelvis was performed following the standard protocol without IV contrast.  RADIATION DOSE REDUCTION: This exam was performed according to the departmental dose-optimization program which includes automated exposure control, adjustment of the mA and/or kV according to patient size and/or use of iterative reconstruction technique.  COMPARISON:  Ultrasound on 09/10/2021  FINDINGS: Lower chest: No acute findings. 6 mm pulmonary nodule in the lateral right lower lobe remains stable since earlier chest CT in 2020, consistent with benign etiology. Emphysema again noted in both lung bases.  Hepatobiliary: No mass visualized on this unenhanced exam. Gallbladder is unremarkable. No evidence of biliary ductal dilatation.  Pancreas: No mass or inflammatory process visualized on this unenhanced exam.  Spleen:  Within normal limits in size.  Adrenals/Urinary tract: Moderate diffuse right renal atrophy is seen. Moderate right hydroureteronephrosis is seen, with a 2 mm calculus in the region of right UVJ. Another small calculus is seen in the bladder in the region of the left UVJ, however there is no evidence of  left-sided hydroureteronephrosis.  Stomach/Bowel: No evidence of obstruction, inflammatory process, or abnormal fluid collections.  Vascular/Lymphatic: No pathologically enlarged lymph nodes identified. No evidence of abdominal aortic aneurysm. Aortic atherosclerotic calcification incidentally noted.  Reproductive:  Mildly enlarged prostate.  Other:  None.  Musculoskeletal:  No suspicious bone lesions identified.  IMPRESSION: Moderate right hydroureteronephrosis, with 2 mm calculus in bladder in region of right UVJ.  Moderate diffuse right renal atrophy.  Small bladder calculus in region of left UVJ, but no left-sided hydronephrosis  Mildly enlarged prostate.   Electronically Signed By: John A Stahl M.D. On: 09/24/2021 20:30   Assessment & Plan:    1. Malignant neoplasm of urinary bladder, unspecified site (HCC) -We will schedule for bladder tumor resection, right diagnostic   ureteroscopy and possible ureteral biopsy.  - Urinalysis, Routine w reflex microscopic   No follow-ups on file.  Tiwan Schnitker, MD  [facility] [place]   

## 2022-02-13 ENCOUNTER — Telehealth: Payer: Self-pay

## 2022-02-13 LAB — MICROSCOPIC EXAMINATION: Bacteria, UA: NONE SEEN

## 2022-02-13 LAB — URINALYSIS, ROUTINE W REFLEX MICROSCOPIC
Bilirubin, UA: NEGATIVE
Glucose, UA: NEGATIVE
Ketones, UA: NEGATIVE
Nitrite, UA: NEGATIVE
Specific Gravity, UA: 1.015 (ref 1.005–1.030)
Urobilinogen, Ur: 0.2 mg/dL (ref 0.2–1.0)
pH, UA: 6.5 (ref 5.0–7.5)

## 2022-02-13 NOTE — Telephone Encounter (Signed)
Patient called back and advised that he wishes to proceed with surgery that was discussed at last appt.

## 2022-02-18 ENCOUNTER — Encounter: Payer: Self-pay | Admitting: Urology

## 2022-02-19 NOTE — Telephone Encounter (Signed)
FYI

## 2022-02-20 ENCOUNTER — Encounter: Payer: Self-pay | Admitting: Urology

## 2022-02-20 NOTE — Patient Instructions (Signed)
Hydronephrosis  Hydronephrosis is the swelling of one or both kidneys due to a blockage that stops urine from flowing out of the body. Kidneys filter waste from the blood and produce urine. This condition can lead to kidney failure and may become life-threatening if not treated promptly. What are the causes? In infants and children, common causes include problems that occur when a baby is developing in the womb. These can include problems in the kidneys or in the tubes that drain urine into the bladder (ureters). In adults, common causes include: Kidney stones. Pregnancy. A tumor or cyst in the abdomen or pelvis. An enlarged prostate gland. Other causes include: Bladder infection. Scar tissue from a previous surgery or injury. A blood clot. Cancer of the prostate, bladder, uterus, ovary, or colon. What are the signs or symptoms? Symptoms of this condition include: Pain or discomfort in your side (flank) or abdomen. Swelling in your abdomen. Nausea and vomiting. Fever. Pain when passing urine. Feelings of urgency when you need to urinate. Urinating more often than normal. In some cases, you may not have any symptoms. How is this diagnosed? This condition may be diagnosed based on: Your symptoms and medical history. A physical exam. Blood and urine tests. Imaging tests, such as an ultrasound, CT scan, or MRI. A procedure to look at your urinary tract and bladder by inserting a scope into the urethra (cystoscopy). How is this treated? Treatment for this condition depends on where the blockage is, how long it has been there, and what caused it. The goal of treatment is to remove the blockage. Treatment may include: Antibiotic medicines to treat or prevent infection. A procedure to place a small, thin tube (stent) into a blocked ureter. The stent will keep the ureter open so that urine can drain through it. A nonsurgical procedure that crushes kidney stones with shock waves  (extracorporeal shock wave lithotripsy). If kidney failure occurs, treatment may include dialysis or a kidney transplant. Follow these instructions at home:  Take over-the-counter and prescription medicines only as told by your health care provider. If you were prescribed an antibiotic medicine, take it exactly as told by your health care provider. Do not stop taking the antibiotic even if you start to feel better. Rest and return to your normal activities as told by your health care provider. Ask your health care provider what activities are safe for you. Drink enough fluid to keep your urine pale yellow. Keep all follow-up visits. This is important. Contact a health care provider if: You continue to have symptoms after treatment. You develop new symptoms. Your urine becomes cloudy or bloody. You have a fever. Get help right away if: You have severe flank or abdominal pain. You cannot drink fluids without vomiting. Summary Hydronephrosis is the swelling of one or both kidneys due to a blockage that stops urine from flowing out of the body. Hydronephrosis can lead to kidney failure and may become life-threatening if not treated promptly. The goal of treatment is to remove the blockage. It may include a procedure to insert a stent into a blocked ureter, a procedure to break up kidney stones, or taking antibiotic medicines. Follow your health care provider's instructions for taking care of yourself at home, including instructions about drinking fluids, taking medicines, and limiting activities. This information is not intended to replace advice given to you by your health care provider. Make sure you discuss any questions you have with your health care provider. Document Revised: 08/16/2019 Document Reviewed: 08/16/2019 Elsevier   Patient Education  2023 [employer]  

## 2022-02-21 ENCOUNTER — Encounter (HOSPITAL_COMMUNITY): Payer: Self-pay

## 2022-02-21 ENCOUNTER — Other Ambulatory Visit: Payer: Self-pay

## 2022-02-21 ENCOUNTER — Encounter (HOSPITAL_COMMUNITY)
Admission: RE | Admit: 2022-02-21 | Discharge: 2022-02-21 | Disposition: A | Discharge status: Home / Self Care | Payer: Medicare Other | Source: Ambulatory Visit | Attending: Urology | Admitting: Urology

## 2022-02-21 NOTE — Pre-Procedure Instructions (Signed)
Attempted pre-op phone call. Left Vm on both [phone] and [phone] for him to call us back.

## 2022-02-24 ENCOUNTER — Ambulatory Visit (HOSPITAL_BASED_OUTPATIENT_CLINIC_OR_DEPARTMENT_OTHER): Payer: Medicare Other | Admitting: Anesthesiology

## 2022-02-24 ENCOUNTER — Ambulatory Visit (HOSPITAL_COMMUNITY)
Admission: RE | Admit: 2022-02-24 | Discharge: 2022-02-24 | Disposition: A | Discharge status: Home / Self Care | Payer: Medicare Other | Source: Ambulatory Visit | Attending: Urology | Admitting: Urology

## 2022-02-24 ENCOUNTER — Encounter (HOSPITAL_COMMUNITY)
Admission: RE | Disposition: A | Discharge status: Home / Self Care | Payer: Self-pay | Source: Ambulatory Visit | Attending: Urology

## 2022-02-24 ENCOUNTER — Encounter (HOSPITAL_COMMUNITY): Payer: Self-pay | Admitting: Urology

## 2022-02-24 ENCOUNTER — Ambulatory Visit (HOSPITAL_COMMUNITY): Payer: Medicare Other | Admitting: Anesthesiology

## 2022-02-24 ENCOUNTER — Ambulatory Visit (HOSPITAL_COMMUNITY): Payer: Medicare Other

## 2022-02-24 DIAGNOSIS — J449 Chronic obstructive pulmonary disease, unspecified: Secondary | ICD-10-CM | POA: Diagnosis not present

## 2022-02-24 DIAGNOSIS — K219 Gastro-esophageal reflux disease without esophagitis: Secondary | ICD-10-CM | POA: Diagnosis not present

## 2022-02-24 DIAGNOSIS — F17210 Nicotine dependence, cigarettes, uncomplicated: Secondary | ICD-10-CM | POA: Insufficient documentation

## 2022-02-24 DIAGNOSIS — N3289 Other specified disorders of bladder: Secondary | ICD-10-CM | POA: Diagnosis not present

## 2022-02-24 DIAGNOSIS — N189 Chronic kidney disease, unspecified: Secondary | ICD-10-CM

## 2022-02-24 DIAGNOSIS — N1330 Unspecified hydronephrosis: Secondary | ICD-10-CM | POA: Insufficient documentation

## 2022-02-24 DIAGNOSIS — C679 Malignant neoplasm of bladder, unspecified: Secondary | ICD-10-CM | POA: Diagnosis not present

## 2022-02-24 DIAGNOSIS — D4959 Neoplasm of unspecified behavior of other genitourinary organ: Secondary | ICD-10-CM

## 2022-02-24 DIAGNOSIS — D494 Neoplasm of unspecified behavior of bladder: Secondary | ICD-10-CM | POA: Diagnosis not present

## 2022-02-24 HISTORY — PX: URETERAL BIOPSY: SHX6688

## 2022-02-24 HISTORY — PX: CYSTOSCOPY WITH RETROGRADE PYELOGRAM, URETEROSCOPY AND STENT PLACEMENT: SHX5789

## 2022-02-24 SURGERY — CYSTOURETEROSCOPY, WITH RETROGRADE PYELOGRAM AND STENT INSERTION
Anesthesia: General | Site: Ureter | Laterality: Right

## 2022-02-24 MED ORDER — DEXAMETHASONE SODIUM PHOSPHATE 4 MG/ML IJ SOLN
INTRAMUSCULAR | Status: DC | PRN
  Administered 2022-02-24: 4 mg via INTRAVENOUS

## 2022-02-24 MED ORDER — SODIUM CHLORIDE 0.9 % IR SOLN
Status: DC | PRN
  Administered 2022-02-24: 3000 mL

## 2022-02-24 MED ORDER — STERILE WATER FOR IRRIGATION IR SOLN
Status: DC | PRN
  Administered 2022-02-24: 500 mL

## 2022-02-24 MED ORDER — ORAL CARE MOUTH RINSE: 15.0000 mL | Freq: Once | OROMUCOSAL | Status: AC

## 2022-02-24 MED ORDER — ONDANSETRON HCL 4 MG/2ML IJ SOLN: 4.0000 mg | Freq: Once | INTRAMUSCULAR | Status: DC | PRN

## 2022-02-24 MED ORDER — CEFAZOLIN SODIUM-DEXTROSE 2-4 GM/100ML-% IV SOLN
2.0000 g | INTRAVENOUS | Status: AC
  Administered 2022-02-24: 2 g via INTRAVENOUS
  Filled 2022-02-24: qty 100

## 2022-02-24 MED ORDER — OXYCODONE HCL 5 MG/5ML PO SOLN: 5.0000 mg | Freq: Once | ORAL | Status: DC | PRN

## 2022-02-24 MED ORDER — FENTANYL CITRATE (PF) 100 MCG/2ML IJ SOLN
INTRAMUSCULAR | Status: DC | PRN
  Administered 2022-02-24: 25 ug via INTRAVENOUS

## 2022-02-24 MED ORDER — PROPOFOL 10 MG/ML IV BOLUS
INTRAVENOUS | Status: AC
  Filled 2022-02-24: qty 20

## 2022-02-24 MED ORDER — OXYCODONE HCL 5 MG PO TABS: 5.0000 mg | ORAL_TABLET | Freq: Once | ORAL | Status: DC | PRN

## 2022-02-24 MED ORDER — ONDANSETRON HCL 4 MG/2ML IJ SOLN
INTRAMUSCULAR | Status: DC | PRN
  Administered 2022-02-24: 4 mg via INTRAVENOUS

## 2022-02-24 MED ORDER — DIATRIZOATE MEGLUMINE 30 % UR SOLN
URETHRAL | Status: AC
  Filled 2022-02-24: qty 100

## 2022-02-24 MED ORDER — PROPOFOL 10 MG/ML IV BOLUS
INTRAVENOUS | Status: DC | PRN
  Administered 2022-02-24: 150 mg via INTRAVENOUS

## 2022-02-24 MED ORDER — EPHEDRINE SULFATE (PRESSORS) 50 MG/ML IJ SOLN
INTRAMUSCULAR | Status: DC | PRN
  Administered 2022-02-24: 5 mg via INTRAVENOUS
  Administered 2022-02-24: 10 mg via INTRAVENOUS

## 2022-02-24 MED ORDER — FENTANYL CITRATE PF 50 MCG/ML IJ SOSY: 25.0000 ug | PREFILLED_SYRINGE | INTRAMUSCULAR | Status: DC | PRN

## 2022-02-24 MED ORDER — LACTATED RINGERS IV SOLN
INTRAVENOUS | Status: DC
  Administered 2022-02-24: 1000 mL via INTRAVENOUS

## 2022-02-24 MED ORDER — LIDOCAINE HCL (CARDIAC) PF 100 MG/5ML IV SOSY
PREFILLED_SYRINGE | INTRAVENOUS | Status: DC | PRN
  Administered 2022-02-24: 40 mg via INTRAVENOUS

## 2022-02-24 MED ORDER — FENTANYL CITRATE (PF) 100 MCG/2ML IJ SOLN
INTRAMUSCULAR | Status: AC
  Filled 2022-02-24: qty 2

## 2022-02-24 MED ORDER — DIATRIZOATE MEGLUMINE 30 % UR SOLN
URETHRAL | Status: DC | PRN
  Administered 2022-02-24: 8 mL via URETHRAL

## 2022-02-24 MED ORDER — CHLORHEXIDINE GLUCONATE 0.12 % MT SOLN
15.0000 mL | Freq: Once | OROMUCOSAL | Status: AC
  Administered 2022-02-24: 15 mL via OROMUCOSAL

## 2022-02-24 MED ORDER — OXYCODONE-ACETAMINOPHEN 7.5-325 MG PO TABS: 1.0000 | ORAL_TABLET | ORAL | 0 refills | Status: DC

## 2022-02-24 SURGICAL SUPPLY — 29 items
BAG DRAIN URO TABLE W/ADPT NS (BAG) ×3 IMPLANT
BAG DRN 8 ADPR NS SKTRN CSTL (BAG) ×2
BAG HAMPER (MISCELLANEOUS) ×3 IMPLANT
CATH INTERMIT  6FR 70CM (CATHETERS) ×3 IMPLANT
CLOTH BEACON ORANGE TIMEOUT ST (SAFETY) ×3 IMPLANT
DRSG TELFA 3X8 NADH STRL (GAUZE/BANDAGES/DRESSINGS) ×1 IMPLANT
FORCEPS BIOP PIRANHA Y (CUTTING FORCEPS) ×1 IMPLANT
GLOVE BIO SURGEON STRL SZ8 (GLOVE) ×3 IMPLANT
GLOVE BIOGEL PI IND STRL 7.0 (GLOVE) ×6 IMPLANT
GLOVE ECLIPSE 6.5 STRL STRAW (GLOVE) ×1 IMPLANT
GOWN STRL REUS W/TWL LRG LVL3 (GOWN DISPOSABLE) ×3 IMPLANT
GOWN STRL REUS W/TWL XL LVL3 (GOWN DISPOSABLE) ×3 IMPLANT
GUIDEWIRE STR DUAL SENSOR (WIRE) ×3 IMPLANT
GUIDEWIRE STR ZIPWIRE 035X150 (MISCELLANEOUS) ×3 IMPLANT
IV NS IRRIG 3000ML ARTHROMATIC (IV SOLUTION) ×6 IMPLANT
KIT TURNOVER CYSTO (KITS) ×3 IMPLANT
MANIFOLD NEPTUNE II (INSTRUMENTS) ×3 IMPLANT
NDL HYPO 18GX1.5 BLUNT FILL (NEEDLE) IMPLANT
NEEDLE HYPO 18GX1.5 BLUNT FILL (NEEDLE) ×2 IMPLANT
PACK CYSTO (CUSTOM PROCEDURE TRAY) ×3 IMPLANT
PAD ARMBOARD 7.5X6 YLW CONV (MISCELLANEOUS) ×3 IMPLANT
STENT URET 6FRX26 CONTOUR (STENTS) ×1 IMPLANT
SYR 10ML LL (SYRINGE) ×3 IMPLANT
SYR 30ML LL (SYRINGE) ×3 IMPLANT
SYR CONTROL 10ML LL (SYRINGE) ×3 IMPLANT
SYR TOOMEY IRRIG 70ML (MISCELLANEOUS) ×2
SYRINGE TOOMEY IRRIG 70ML (MISCELLANEOUS) ×3 IMPLANT
TOWEL OR 17X26 4PK STRL BLUE (TOWEL DISPOSABLE) ×3 IMPLANT
WATER STERILE IRR 500ML POUR (IV SOLUTION) ×3 IMPLANT

## 2022-02-24 NOTE — Transfer of Care (Signed)
Immediate Anesthesia Transfer of Care Note  Patient: Dustin Walker  Procedure(s) Performed: CYSTOSCOPY WITH RETROGRADE PYELOGRAM,  DIAGNOSTIC URETEROSCOPY AND STENT PLACEMENT (Right: Ureter) TRANSURETHRAL RESECTION OF BLADDER TUMOR (TURBT) (Right: Bladder)  Patient Location: PACU  Anesthesia Type:General  Level of Consciousness: awake and alert   Airway & Oxygen Therapy: Patient Spontanous Breathing  Post-op Assessment: Report given to RN and Post -op Vital signs reviewed and stable  Post vital signs: Reviewed and stable  Last Vitals:  Vitals Value Taken Time  BP    Temp    Pulse    Resp    SpO2      Last Pain:  Vitals:   02/24/22 1014  TempSrc: Oral  PainSc: 0-No pain      Patients Stated Pain Goal: 5 (28/40/69 8614)  Complications: No notable events documented.

## 2022-02-24 NOTE — Op Note (Signed)
Preoperative diagnosis: Right hydronephrosis and bladder canceer  Postoperative diagnosis: Same  Procedure: 1 cystoscopy 2.  right retrograde pyelography 3.  Intraoperative fluoroscopy, under one hour, with interpretation 4.  Right diagnostic ureteroscopy 5. Right ureteral biopsy 6. Right 6x26 JJ ureteral stent placement  Attending: Rosie Fate  Anesthesia: General  Estimated blood loss: None  Drains: right 6x26 JJ ureteral stent without tether  Specimens: right ureteral tumor  Antibiotics: ancef  Findings: erythema around right ureteral orifice. Moderate right hydronephrosis. Papillary and sessile ureteral tumor extending 4cm along mid ureter.  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 He underwent percutaneous right ureteral stent placement with interventional radiology. He presents today for right diagnostic ureteroscopy. After discussing treatment options, he decided proceed with right diagnostic ureteroscopy  Procedure in detail: The patient was brought to the operating room and a brief timeout was done to ensure correct patient, correct procedure, correct site.  General anesthesia was administered patient was placed in dorsal lithotomy position.  his genitalia was then prepped and draped in usual sterile fashion.  A rigid 17 French cystoscope was passed in the urethra and the bladder.  Bladder was inspected and erythema was noted around the right ureteral orifice.   a 6 french ureteral catheter was then instilled into the right ureter orifice.  a gentle retrograde was obtained and findings noted above.  Using a grasper the right ureteral stent was brought to the urethral meatus. we then placed a zip wire through the ureteral stent and advanced up to the renal pelvis.  We then removed the stent. we removed the cystoscope and cannulated the right ureteral orifice with a semirigid ureteroscope.  we then  performed ureteroscopy up to the level of the UPJ. We noted 4cm of mid ureter involved with papillary and sessile tumor. Using a piranha biopsy forceps we obtained 3 biopsies of the right ureter. We then elected to place a stent over the zipwire. A 6x26 JJ ureteral stent was advanced over the wire and up to the renal pelvis. The wire was removed and good coil was noted in the rena pelvis under fluoroscopy and the bladder under direct vision. the bladder was then drained and this concluded the procedure which was well tolerated by patient.  Complications: None  Condition: Stable, extubated, transferred to PACU  Plan: Pt is to followup in 1 week for a pathology discussion

## 2022-02-24 NOTE — Anesthesia Preprocedure Evaluation (Signed)
Anesthesia Evaluation  Patient identified by MRN, date of birth, ID band Patient awake    Reviewed: Allergy & Precautions, H&P , NPO status , Patient's Chart, lab work & pertinent test results, reviewed documented beta blocker date and time   Airway Mallampati: II  TM Distance: >3 FB Neck ROM: full    Dental no notable dental hx.    Pulmonary COPD, Current Smoker,    Pulmonary exam normal breath sounds clear to auscultation       Cardiovascular Exercise Tolerance: Good negative cardio ROS   Rhythm:regular Rate:Normal     Neuro/Psych  Headaches, negative psych ROS   GI/Hepatic Neg liver ROS, GERD  Medicated,  Endo/Other  negative endocrine ROS  Renal/GU CRFRenal disease  negative genitourinary   Musculoskeletal   Abdominal   Peds  Hematology negative hematology ROS (+)   Anesthesia Other Findings   Reproductive/Obstetrics negative OB ROS                             Anesthesia Physical Anesthesia Plan  ASA: 3  Anesthesia Plan: General and General LMA   Post-op Pain Management:    Induction:   PONV Risk Score and Plan: Ondansetron  Airway Management Planned:   Additional Equipment:   Intra-op Plan:   Post-operative Plan:   Informed Consent: I have reviewed the patients History and Physical, chart, labs and discussed the procedure including the risks, benefits and alternatives for the proposed anesthesia with the patient or authorized representative who has indicated his/her understanding and acceptance.     Dental Advisory Given  Plan Discussed with: CRNA  Anesthesia Plan Comments:         Anesthesia Quick Evaluation

## 2022-02-24 NOTE — Anesthesia Procedure Notes (Signed)
Procedure Name: LMA Insertion Date/Time: 02/24/2022 12:41 PM  Performed by: Camillia Herter, RNPre-anesthesia Checklist: Patient identified, Emergency Drugs available, Suction available and Patient being monitored Patient Re-evaluated:Patient Re-evaluated prior to induction Oxygen Delivery Method: Circle system utilized Preoxygenation: Pre-oxygenation with 100% oxygen Induction Type: IV induction Ventilation: Mask ventilation without difficulty LMA: LMA inserted LMA Size: 4.0 Number of attempts: 1 Placement Confirmation: positive ETCO2 and breath sounds checked- equal and bilateral Tube secured with: Tape Dental Injury: Teeth and Oropharynx as per pre-operative assessment

## 2022-02-24 NOTE — Interval H&P Note (Signed)
History and Physical Interval Note:  02/24/2022 11:42 AM  Dustin Walker  has presented today for surgery, with the diagnosis of bladder cancer.  The various methods of treatment have been discussed with the patient and family. After consideration of risks, benefits and other options for treatment, the patient has consented to  Procedure(s): CYSTOSCOPY WITH RETROGRADE PYELOGRAM,  DIAGNOSTIC URETEROSCOPY AND STENT PLACEMENT (Right) TRANSURETHRAL RESECTION OF BLADDER TUMOR (TURBT) (Right) as a surgical intervention.  The patient's history has been reviewed, patient examined, no change in status, stable for surgery.  I have reviewed the patient's chart and labs.  Questions were answered to the patient's satisfaction.     [name]

## 2022-02-25 LAB — SURGICAL PATHOLOGY

## 2022-02-25 NOTE — Anesthesia Postprocedure Evaluation (Signed)
Anesthesia Post Note  Patient: Dustin Walker  Procedure(s) Performed: CYSTOSCOPY WITH RETROGRADE PYELOGRAM,   AND STENT PLACEMENT (Right: Ureter) URETERAL BIOPSY (Right: Ureter)  Patient location during evaluation: Phase II Anesthesia Type: General Level of consciousness: awake Pain management: pain level controlled Vital Signs Assessment: post-procedure vital signs reviewed and stable Respiratory status: spontaneous breathing and respiratory function stable Cardiovascular status: blood pressure returned to baseline and stable Postop Assessment: no headache and no apparent nausea or vomiting Anesthetic complications: no Comments: Late entry   No notable events documented.   Last Vitals:  Vitals:   02/24/22 1400 02/24/22 1404  BP: 108/66 110/67  Pulse:  62  Resp: 10 16  Temp:  (!) 36.4 C  SpO2: 97% 98%    Last Pain:  Vitals:   02/24/22 1404  TempSrc: Oral  PainSc: 0-No pain                 Louann Sjogren

## 2022-02-28 ENCOUNTER — Encounter (HOSPITAL_COMMUNITY): Payer: Self-pay | Admitting: Urology
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
## 2022-03-03 ENCOUNTER — Ambulatory Visit: Payer: Medicare Other | Admitting: Urology

## 2022-03-03 ENCOUNTER — Ambulatory Visit
Admission: RE | Admit: 2022-03-03 | Discharge: 2022-03-03 | Disposition: A | Discharge status: Home / Self Care | Payer: Medicare Other | Source: Ambulatory Visit | Attending: Neurology | Admitting: Neurology

## 2022-03-03 VITALS — BP 111/76 | HR 79

## 2022-03-03 DIAGNOSIS — C679 Malignant neoplasm of bladder, unspecified: Secondary | ICD-10-CM | POA: Diagnosis not present

## 2022-03-03 DIAGNOSIS — G8921 Chronic pain due to trauma: Secondary | ICD-10-CM

## 2022-03-03 DIAGNOSIS — M542 Cervicalgia: Secondary | ICD-10-CM

## 2022-03-03 DIAGNOSIS — N1330 Unspecified hydronephrosis: Secondary | ICD-10-CM | POA: Diagnosis not present

## 2022-03-03 DIAGNOSIS — R519 Headache, unspecified: Secondary | ICD-10-CM

## 2022-03-03 NOTE — H&P (View-Only) (Signed)
03/03/2022 11:38 AM   Dustin Walker [birth year] [id number]  Referring provider: Dulce Sellar, MD [address] [place],  [place] [address]  Followup ureteral biopsy   HPI: Dustin Walker is a 71yo here for followup after right ureteral biopsy. 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 He has intermittent hematuria.    PMH: Past Medical History:  Diagnosis Date   Allergic rhinitis    Arthritis    BCC (basal cell carcinoma) 04/02/2015   left upper eyelid, 06/12/15 MOHs   Chronic pain    left hand, crush injury in 2013   COPD (chronic obstructive pulmonary disease) (HCC)    GERD (gastroesophageal reflux disease)    Headache    migraines   SCC (squamous cell carcinoma) 10/29/2015   well diff, upper mid bac (CX3, 5FU)    Surgical History: Past Surgical History:  Procedure Laterality Date   COLONOSCOPY  2010   Dr. Gala Romney: tubular adenoma removed. was due for surveillance in 2015.   COLONOSCOPY WITH PROPOFOL N/A 02/22/2018   Procedure: COLONOSCOPY WITH PROPOFOL;  Surgeon: Daneil Dolin, MD;  [facility];  Service: Endoscopy;  Laterality: N/A;  12:00pm   CYSTOSCOPY Right 12/06/2021   Procedure: CYSTOSCOPY;  Surgeon: Cleon Gustin, MD;  [facility]: [facility];  Service: Urology;  Laterality: Right;   CYSTOSCOPY WITH RETROGRADE PYELOGRAM, URETEROSCOPY AND STENT PLACEMENT Right 02/24/2022   Procedure: CYSTOSCOPY WITH RETROGRADE PYELOGRAM,   AND STENT PLACEMENT;  Surgeon: Cleon Gustin, MD;  [facility]: [facility];  Service: Urology;  Laterality: Right;   HAND SURGERY Right    middle finger   I & D EXTREMITY  06/19/2011   Procedure: IRRIGATION AND DEBRIDEMENT EXTREMITY;  Surgeon: Dennie Bible, MD;  Location: [facility];  Service: Plastics;;   IR NEPHROSTOGRAM RIGHT THRU EXISTING ACCESS  02/10/2022   IR NEPHROSTOMY PLACEMENT  RIGHT  12/20/2021   IR NEPHROSTOMY PLACEMENT RIGHT  02/05/2022   JOINT REPLACEMENT     knee   KNEE SURGERY Bilateral    6 times per knee and total knee replacement on the left   ORIF ULNAR FRACTURE  06/19/2011   Procedure: OPEN REDUCTION INTERNAL FIXATION (ORIF) ULNAR FRACTURE;  Surgeon: Dennie Bible, MD;  Location: [facility];  Service: Plastics;  Laterality: Left;   SHOULDER SURGERY Right    SPINAL CORD STIMULATOR BATTERY EXCHANGE Left 02/11/2021   Procedure: REPLACEMENT PULSE GENERATOR LEFT FLANK (MEDTRONIC);  Surgeon: [name], MD;  Location: [facility];  Service: Neurosurgery;  Laterality: Left;  LOCAL W/ MAC   TRANSURETHRAL RESECTION OF BLADDER TUMOR N/A 12/06/2021   Procedure: TRANSURETHRAL RESECTION OF BLADDER TUMOR (TURBT);  Surgeon: Cleon Gustin, MD;  [facility];  Service: Urology;  Laterality: N/A;   URETERAL BIOPSY Right 02/24/2022   Procedure: URETERAL BIOPSY;  Surgeon: Cleon Gustin, MD;  [facility];  Service: Urology;  Laterality: Right;    Home Medications:  Allergies as of 03/03/2022       Reactions   Naproxen Sodium Rash, Other (See Comments)   "Burns from inside out" Aleve   Codeine Rash, Other (See Comments)   Burning        Medication List        Accurate as of March 03, 2022 11:38 AM. If you have any questions, ask your nurse or doctor.  albuterol 108 (90 Base) MCG/ACT inhaler Commonly known as: VENTOLIN HFA Inhale 2 puffs into the lungs every 6 (six) hours as needed for wheezing or shortness of breath.   Magnesium 250 MG Tabs Take 250 mg by mouth 2 (two) times daily.   nortriptyline 50 MG capsule Commonly known as: PAMELOR Take 2 capsules (100 mg total) by mouth at bedtime.   Nurtec 75 MG Tbdp Generic drug: Rimegepant Sulfate Take 75 mg by mouth daily as needed (Migraine).   oxyCODONE-acetaminophen 7.5-325 MG tablet Commonly known as: PERCOCET Take 1 tablet by mouth See admin instructions. Take three to 4  times a day   pantoprazole 40 MG tablet Commonly known as: PROTONIX Take 1 tablet (40 mg total) by mouth daily. What changed: when to take this   phenazopyridine 100 MG tablet Commonly known as: Pyridium Take 1 tablet (100 mg total) by mouth 3 (three) times daily as needed for pain.   tamsulosin 0.4 MG Caps capsule Commonly known as: FLOMAX Take 1 capsule (0.4 mg total) by mouth daily.   topiramate 50 MG tablet Commonly known as: TOPAMAX Take 50 mg by mouth 2 (two) times daily.        Allergies:  Allergies  Allergen Reactions   Naproxen Sodium Rash and Other (See Comments)    "Burns from inside out" Aleve   Codeine Rash and Other (See Comments)    Burning    Family History: Family History  Problem Relation Age of Onset   Colon cancer Neg Hx     Social History:  reports that he has been smoking cigarettes. He started smoking about 59 years ago. He has a 49.00 pack-year smoking history. He has never used smokeless tobacco. He reports that he does not currently use alcohol. He reports that he does not use drugs.  ROS: All other review of systems were reviewed and are negative except what is noted above in HPI  Physical Exam: BP 111/76   Pulse 79   Constitutional:  Alert and oriented, No acute distress. HEENT: [place] AT, moist mucus membranes.  Trachea midline, no masses. Cardiovascular: No clubbing, cyanosis, or edema. Respiratory: Normal respiratory effort, no increased work of breathing. GI: Abdomen is soft, nontender, nondistended, no abdominal masses GU: No CVA tenderness.  Lymph: No cervical or inguinal lymphadenopathy. Skin: No rashes, bruises or suspicious lesions. Neurologic: Grossly intact, no focal deficits, moving all 4 extremities. Psychiatric: Normal mood and affect.  Laboratory Data: Lab Results  Component Value Date   WBC 6.5 02/05/2022   HGB 14.0 02/05/2022   HCT 44.0 02/05/2022   MCV 95.0 02/05/2022   PLT 198 02/05/2022    Lab Results   Component Value Date   CREATININE 2.06 (H) 02/05/2022    No results found for: "PSA"  No results found for: "TESTOSTERONE"  No results found for: "HGBA1C"  Urinalysis    Component Value Date/Time   COLORURINE YELLOW 06/26/2010 1000   APPEARANCEUR Hazy (A) 02/12/2022 1519   LABSPEC 1.007 06/26/2010 1000   PHURINE 6.5 06/26/2010 1000   GLUCOSEU Negative 02/12/2022 1519   HGBUR NEGATIVE 06/26/2010 1000   BILIRUBINUR Negative 02/12/2022 1519   KETONESUR NEGATIVE 06/26/2010 1000   PROTEINUR 3+ (A) 02/12/2022 1519   PROTEINUR NEGATIVE 06/26/2010 1000   UROBILINOGEN 0.2 06/26/2010 1000   NITRITE Negative 02/12/2022 1519   NITRITE NEGATIVE 06/26/2010 1000   LEUKOCYTESUR 1+ (A) 02/12/2022 1519    Lab Results  Component Value Date   LABMICR See below: 02/12/2022   [facility]  11-30 (A) 02/12/2022   LABEPIT 0-10 02/12/2022   MUCUS Present 01/15/2022   BACTERIA None seen 02/12/2022    Pertinent Imaging:  Results for orders placed in visit on 10/18/21  Abdomen 1 view (KUB)  Narrative CLINICAL DATA:  Nephrolithiasis  EXAM: ABDOMEN - 1 VIEW  COMPARISON:  None Available.  FINDINGS: Nonobstructive bowel gas pattern. No free intraperitoneal gas. Large colonic stool burden. No definite nephro or urolithiasis. Mild vascular calcification noted within the pelvis. No organomegaly. Osseous structures are unremarkable.  IMPRESSION: No definite nephro or urolithiasis.  Large colonic stool burden.   Electronically Signed By: Fidela Salisbury M.D. On: 10/19/2021 21:30  No results found for this or any previous visit.  No results found for this or any previous visit.  No results found for this or any previous visit.  Results for orders placed during the hospital encounter of 11/27/21  Ultrasound renal complete  Narrative CLINICAL DATA:  hydronephrosis  EXAM: RENAL / URINARY TRACT ULTRASOUND COMPLETE  COMPARISON:  CT dated Sep 23, 2021; ultrasound in Sep 10, 2021  FINDINGS: Right Kidney:  Renal measurements: 8.9 x 5.2 x 4.6 cm = volume: 109 mL. Echogenicity within normal limits. Persistent severe hydronephrosis with mild cortical thinning. This persists postvoid. This is not significantly changed in comparison to prior ultrasound.  Left Kidney:  Renal measurements: 10.0 x 5.7 x 4.7 cm = volume: 141 mL. Echogenicity within normal limits. No mass or hydronephrosis visualized.  Bladder:  Decompressed and not visualized.  Other:  None.  IMPRESSION: Persistent severe RIGHT-sided hydronephrosis, similar in comparison to prior ultrasound and CT. This persists postvoid. There is mild cortical thinning.   Electronically Signed By: Valentino Saxon M.D. On: 11/27/2021 17:18  No valid procedures specified. No results found for this or any previous visit.  Results for orders placed during the hospital encounter of 09/23/21  CT RENAL STONE STUDY  Narrative CLINICAL DATA:  Right flank pain. Right hydronephrosis. Chronic kidney disease.  EXAM: CT ABDOMEN AND PELVIS WITHOUT CONTRAST  TECHNIQUE: Multidetector CT imaging of the abdomen and pelvis was performed following the standard protocol without IV contrast.  RADIATION DOSE REDUCTION: This exam was performed according to the departmental dose-optimization program which includes automated exposure control, adjustment of the mA and/or kV according to patient size and/or use of iterative reconstruction technique.  COMPARISON:  Ultrasound on 09/10/2021  FINDINGS: Lower chest: No acute findings. 6 mm pulmonary nodule in the lateral right lower lobe remains stable since earlier chest CT in 2020, consistent with benign etiology. Emphysema again noted in both lung bases.  Hepatobiliary: No mass visualized on this unenhanced exam. Gallbladder is unremarkable. No evidence of biliary ductal dilatation.  Pancreas: No mass or inflammatory process visualized on this unenhanced  exam.  Spleen:  Within normal limits in size.  Adrenals/Urinary tract: Moderate diffuse right renal atrophy is seen. Moderate right hydroureteronephrosis is seen, with a 2 mm calculus in the region of right UVJ. Another small calculus is seen in the bladder in the region of the left UVJ, however there is no evidence of left-sided hydroureteronephrosis.  Stomach/Bowel: No evidence of obstruction, inflammatory process, or abnormal fluid collections.  Vascular/Lymphatic: No pathologically enlarged lymph nodes identified. No evidence of abdominal aortic aneurysm. Aortic atherosclerotic calcification incidentally noted.  Reproductive:  Mildly enlarged prostate.  Other:  None.  Musculoskeletal:  No suspicious bone lesions identified.  IMPRESSION: Moderate right hydroureteronephrosis, with 2 mm calculus in bladder in region of right UVJ.  Moderate diffuse right renal  atrophy.  Small bladder calculus in region of left UVJ, but no left-sided hydronephrosis  Mildly enlarged prostate.   Electronically Signed By: Marlaine Hind M.D. On: 09/24/2021 20:30   Assessment & Plan:    1. Ureteral TCC -We discussed the management of ureteral TCC including surveillance, chemotherapy and radiation therapy and radical nephroureterectomy. After discussing the options the patient elects for radical nephroureterectomy.  - Basic metabolic panel     No follow-ups on file.  [name], MD  [facility] [place]

## 2022-03-03 NOTE — Progress Notes (Signed)
 03/03/2022 11:38 AM   Dustin Walker 06/10/1950 [id number]  Referring provider: Kahoano, Haku K, MD [address] [place],  [place] [address]  Followup ureteral biopsy   HPI: Dustin Walker is a 71yo here for followup after right ureteral biopsy. 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 He has intermittent hematuria.    PMH: Past Medical History:  Diagnosis Date   Allergic rhinitis    Arthritis    BCC (basal cell carcinoma) 04/02/2015   left upper eyelid, 06/12/15 MOHs   Chronic pain    left hand, crush injury in 2013   COPD (chronic obstructive pulmonary disease) (HCC)    GERD (gastroesophageal reflux disease)    Headache    migraines   SCC (squamous cell carcinoma) 10/29/2015   well diff, upper mid bac (CX3, 5FU)    Surgical History: Past Surgical History:  Procedure Laterality Date   COLONOSCOPY  2010   Dr. Rourk: tubular adenoma removed. was due for surveillance in 2015.   COLONOSCOPY WITH PROPOFOL N/A 02/22/2018   Procedure: COLONOSCOPY WITH PROPOFOL;  Surgeon: Rourk, Arias M, MD;  [facility];  Service: Endoscopy;  Laterality: N/A;  12:00pm   CYSTOSCOPY Right 12/06/2021   Procedure: CYSTOSCOPY;  Surgeon: Grayson Pfefferle L, MD;  [facility];  Service: Urology;  Laterality: Right;   CYSTOSCOPY WITH RETROGRADE PYELOGRAM, URETEROSCOPY AND STENT PLACEMENT Right 02/24/2022   Procedure: CYSTOSCOPY WITH RETROGRADE PYELOGRAM,   AND STENT PLACEMENT;  Surgeon: Kalvin Buss L, MD;  [facility];  Service: Urology;  Laterality: Right;   HAND SURGERY Right    middle finger   I & D EXTREMITY  06/19/2011   Procedure: IRRIGATION AND DEBRIDEMENT EXTREMITY;  Surgeon: Harrill C Coley, MD;  [facility];  Service: Plastics;;   IR NEPHROSTOGRAM RIGHT THRU EXISTING ACCESS  02/10/2022   IR NEPHROSTOMY PLACEMENT  RIGHT  12/20/2021   IR NEPHROSTOMY PLACEMENT RIGHT  02/05/2022   JOINT REPLACEMENT     knee   KNEE SURGERY Bilateral    6 times per knee and total knee replacement on the left   ORIF ULNAR FRACTURE  06/19/2011   Procedure: OPEN REDUCTION INTERNAL FIXATION (ORIF) ULNAR FRACTURE;  Surgeon: Harrill C Coley, MD;  [facility];  Service: Plastics;  Laterality: Left;   SHOULDER SURGERY Right    SPINAL CORD STIMULATOR BATTERY EXCHANGE Left 02/11/2021   Procedure: REPLACEMENT PULSE GENERATOR LEFT FLANK (MEDTRONIC);  Surgeon: Cook, Steven, MD;  Location: [facility];  Service: Neurosurgery;  Laterality: Left;  LOCAL W/ MAC   TRANSURETHRAL RESECTION OF BLADDER TUMOR N/A 12/06/2021   Procedure: TRANSURETHRAL RESECTION OF BLADDER TUMOR (TURBT);  Surgeon: Naija Troost L, MD;  [facility];  Service: Urology;  Laterality: N/A;   URETERAL BIOPSY Right 02/24/2022   Procedure: URETERAL BIOPSY;  Surgeon: Kerrin Markman L, MD;  [facility];  Service: Urology;  Laterality: Right;    Home Medications:  Allergies as of 03/03/2022       Reactions   Naproxen Sodium Rash, Other (See Comments)   "Burns from inside out" Aleve   Codeine Rash, Other (See Comments)   Burning        Medication List        Accurate as of March 03, 2022 11:38 AM. If you have any questions, ask your nurse or doctor.            albuterol 108 (90 Base) MCG/ACT inhaler Commonly known as: VENTOLIN HFA Inhale 2 puffs into the lungs every 6 (six) hours as needed for wheezing or shortness of breath.   Magnesium 250 MG Tabs Take 250 mg by mouth 2 (two) times daily.   nortriptyline 50 MG capsule Commonly known as: PAMELOR Take 2 capsules (100 mg total) by mouth at bedtime.   Nurtec 75 MG Tbdp Generic drug: Rimegepant Sulfate Take 75 mg by mouth daily as needed (Migraine).   oxyCODONE-acetaminophen 7.5-325 MG tablet Commonly known as: PERCOCET Take 1 tablet by mouth See admin instructions. Take three to 4  times a day   pantoprazole 40 MG tablet Commonly known as: PROTONIX Take 1 tablet (40 mg total) by mouth daily. What changed: when to take this   phenazopyridine 100 MG tablet Commonly known as: Pyridium Take 1 tablet (100 mg total) by mouth 3 (three) times daily as needed for pain.   tamsulosin 0.4 MG Caps capsule Commonly known as: FLOMAX Take 1 capsule (0.4 mg total) by mouth daily.   topiramate 50 MG tablet Commonly known as: TOPAMAX Take 50 mg by mouth 2 (two) times daily.        Allergies:  Allergies  Allergen Reactions   Naproxen Sodium Rash and Other (See Comments)    "Burns from inside out" Aleve   Codeine Rash and Other (See Comments)    Burning    Family History: Family History  Problem Relation Age of Onset   Colon cancer Neg Hx     Social History:  reports that he has been smoking cigarettes. He started smoking about 59 years ago. He has a 49.00 pack-year smoking history. He has never used smokeless tobacco. He reports that he does not currently use alcohol. He reports that he does not use drugs.  ROS: All other review of systems were reviewed and are negative except what is noted above in HPI  Physical Exam: BP 111/76   Pulse 79   Constitutional:  Alert and oriented, No acute distress. HEENT: [place] AT, moist mucus membranes.  Trachea midline, no masses. Cardiovascular: No clubbing, cyanosis, or edema. Respiratory: Normal respiratory effort, no increased work of breathing. GI: Abdomen is soft, nontender, nondistended, no abdominal masses GU: No CVA tenderness.  Lymph: No cervical or inguinal lymphadenopathy. Skin: No rashes, bruises or suspicious lesions. Neurologic: Grossly intact, no focal deficits, moving all 4 extremities. Psychiatric: Normal mood and affect.  Laboratory Data: Lab Results  Component Value Date   WBC 6.5 02/05/2022   HGB 14.0 02/05/2022   HCT 44.0 02/05/2022   MCV 95.0 02/05/2022   PLT 198 02/05/2022    Lab Results   Component Value Date   CREATININE 2.06 (H) 02/05/2022    No results found for: "PSA"  No results found for: "TESTOSTERONE"  No results found for: "HGBA1C"  Urinalysis    Component Value Date/Time   COLORURINE YELLOW 06/26/2010 1000   APPEARANCEUR Hazy (A) 02/12/2022 1519   LABSPEC 1.007 06/26/2010 1000   PHURINE 6.5 06/26/2010 1000   GLUCOSEU Negative 02/12/2022 1519   HGBUR NEGATIVE 06/26/2010 1000   BILIRUBINUR Negative 02/12/2022 1519   KETONESUR NEGATIVE 06/26/2010 1000   PROTEINUR 3+ (A) 02/12/2022 1519   PROTEINUR NEGATIVE 06/26/2010 1000   UROBILINOGEN 0.2 06/26/2010 1000   NITRITE Negative 02/12/2022 1519   NITRITE NEGATIVE 06/26/2010 1000   LEUKOCYTESUR 1+ (A) 02/12/2022 1519    Lab Results  Component Value Date   LABMICR See below: 02/12/2022   [facility]   11-30 (A) 02/12/2022   LABEPIT 0-10 02/12/2022   MUCUS Present 01/15/2022   BACTERIA None seen 02/12/2022    Pertinent Imaging:  Results for orders placed in visit on 10/18/21  Abdomen 1 view (KUB)  Narrative CLINICAL DATA:  Nephrolithiasis  EXAM: ABDOMEN - 1 VIEW  COMPARISON:  None Available.  FINDINGS: Nonobstructive bowel gas pattern. No free intraperitoneal gas. Large colonic stool burden. No definite nephro or urolithiasis. Mild vascular calcification noted within the pelvis. No organomegaly. Osseous structures are unremarkable.  IMPRESSION: No definite nephro or urolithiasis.  Large colonic stool burden.   Electronically Signed By: Fidela Salisbury M.D. On: 10/19/2021 21:30  No results found for this or any previous visit.  No results found for this or any previous visit.  No results found for this or any previous visit.  Results for orders placed during the hospital encounter of 11/27/21  Ultrasound renal complete  Narrative CLINICAL DATA:  hydronephrosis  EXAM: RENAL / URINARY TRACT ULTRASOUND COMPLETE  COMPARISON:  CT dated Sep 23, 2021; ultrasound in Sep 10, 2021  FINDINGS: Right Kidney:  Renal measurements: 8.9 x 5.2 x 4.6 cm = volume: 109 mL. Echogenicity within normal limits. Persistent severe hydronephrosis with mild cortical thinning. This persists postvoid. This is not significantly changed in comparison to prior ultrasound.  Left Kidney:  Renal measurements: 10.0 x 5.7 x 4.7 cm = volume: 141 mL. Echogenicity within normal limits. No mass or hydronephrosis visualized.  Bladder:  Decompressed and not visualized.  Other:  None.  IMPRESSION: Persistent severe RIGHT-sided hydronephrosis, similar in comparison to prior ultrasound and CT. This persists postvoid. There is mild cortical thinning.   Electronically Signed By: Valentino Saxon M.D. On: 11/27/2021 17:18  No valid procedures specified. No results found for this or any previous visit.  Results for orders placed during the hospital encounter of 09/23/21  CT RENAL STONE STUDY  Narrative CLINICAL DATA:  Right flank pain. Right hydronephrosis. Chronic kidney disease.  EXAM: CT ABDOMEN AND PELVIS WITHOUT CONTRAST  TECHNIQUE: Multidetector CT imaging of the abdomen and pelvis was performed following the standard protocol without IV contrast.  RADIATION DOSE REDUCTION: This exam was performed according to the departmental dose-optimization program which includes automated exposure control, adjustment of the mA and/or kV according to patient size and/or use of iterative reconstruction technique.  COMPARISON:  Ultrasound on 09/10/2021  FINDINGS: Lower chest: No acute findings. 6 mm pulmonary nodule in the lateral right lower lobe remains stable since earlier chest CT in 2020, consistent with benign etiology. Emphysema again noted in both lung bases.  Hepatobiliary: No mass visualized on this unenhanced exam. Gallbladder is unremarkable. No evidence of biliary ductal dilatation.  Pancreas: No mass or inflammatory process visualized on this unenhanced  exam.  Spleen:  Within normal limits in size.  Adrenals/Urinary tract: Moderate diffuse right renal atrophy is seen. Moderate right hydroureteronephrosis is seen, with a 2 mm calculus in the region of right UVJ. Another small calculus is seen in the bladder in the region of the left UVJ, however there is no evidence of left-sided hydroureteronephrosis.  Stomach/Bowel: No evidence of obstruction, inflammatory process, or abnormal fluid collections.  Vascular/Lymphatic: No pathologically enlarged lymph nodes identified. No evidence of abdominal aortic aneurysm. Aortic atherosclerotic calcification incidentally noted.  Reproductive:  Mildly enlarged prostate.  Other:  None.  Musculoskeletal:  No suspicious bone lesions identified.  IMPRESSION: Moderate right hydroureteronephrosis, with 2 mm calculus in bladder in region of right UVJ.  Moderate diffuse right renal  atrophy.  Small bladder calculus in region of left UVJ, but no left-sided hydronephrosis  Mildly enlarged prostate.   Electronically Signed By: John A Stahl M.D. On: 09/24/2021 20:30   Assessment & Plan:    1. Ureteral TCC -We discussed the management of ureteral TCC including surveillance, chemotherapy and radiation therapy and radical nephroureterectomy. After discussing the options the patient elects for radical nephroureterectomy.  - Basic metabolic panel     No follow-ups on file.  Lorrene Graef, MD   [facility] [place]   

## 2022-03-04 LAB — BASIC METABOLIC PANEL
BUN/Creatinine Ratio: 21 (ref 10–24)
BUN: 36 mg/dL — ABNORMAL HIGH (ref 8–27)
CO2: 19 mmol/L — ABNORMAL LOW (ref 20–29)
Calcium: 8.9 mg/dL (ref 8.6–10.2)
Chloride: 108 mmol/L — ABNORMAL HIGH (ref 96–106)
Creatinine, Ser: 1.74 mg/dL — ABNORMAL HIGH (ref 0.76–1.27)
Glucose: 100 mg/dL — ABNORMAL HIGH (ref 70–99)
Potassium: 4.7 mmol/L (ref 3.5–5.2)
Sodium: 141 mmol/L (ref 134–144)
eGFR: 42 mL/min/{1.73_m2} — ABNORMAL LOW (ref >59)

## 2022-03-06 ENCOUNTER — Ambulatory Visit: Payer: Medicare Other | Admitting: Neurology

## 2022-03-06 ENCOUNTER — Encounter: Payer: Self-pay | Admitting: Neurology

## 2022-03-06 VITALS — BP 100/66 | HR 77 | Resp 18 | Ht 68.0 in | Wt 116.0 lb

## 2022-03-06 DIAGNOSIS — G8929 Other chronic pain: Secondary | ICD-10-CM

## 2022-03-06 DIAGNOSIS — G8921 Chronic pain due to trauma: Secondary | ICD-10-CM

## 2022-03-06 DIAGNOSIS — M542 Cervicalgia: Secondary | ICD-10-CM | POA: Diagnosis not present

## 2022-03-06 DIAGNOSIS — R519 Headache, unspecified: Secondary | ICD-10-CM | POA: Diagnosis not present

## 2022-03-06 NOTE — Patient Instructions (Addendum)
Continue your current medications. -Nortriptyline 100 mg daily -Topiramate 50 mg BID -Nurtec 75 mg PRN  Go to physical therapy as planned for your neck.  I will see you again in 3 months after you recover from your surgery.   Please call if you have severe headaches in the meantime.  The physicians and staff at [facility] are committed to providing excellent care. You may receive a survey requesting feedback about your experience at our office. We strive to receive "very good" responses to the survey questions. If you feel that your experience would prevent you from giving the office a "very good " response, please contact our office to try to remedy the situation. We may be reached at [phone]. Thank you for taking the time out of your busy day to complete the survey.  Kai Levins, MD [facility]

## 2022-03-10 ENCOUNTER — Encounter: Payer: Self-pay | Admitting: Urology

## 2022-03-10 NOTE — Patient Instructions (Signed)
Kidney Cancer  Kidney cancer is a type of cancer in which an abnormal growth of cells (tumor) forms in one or both kidneys. The kidneys filter waste from your blood and make urine. Kidney cancer may spread to other parts of your body. This type of cancer may also be called renal cell carcinoma. What are the causes? The cause of this condition is not known. What increases the risk? You may be more likely to develop kidney cancer if: You are over age 60. You have certain conditions that are passed from parent to child (inherited). These include von Hippel-Lindau disease, tuberous sclerosis, and hereditary papillary renal carcinoma. You smoke or have been exposed to certain chemicals. You have advanced kidney disease, especially if you need to use a machine to clean your blood (dialysis). You are obese. You have high blood pressure (hypertension). You are male. What are the signs or symptoms? At first, kidney cancer may not cause any symptoms. As the cancer grows, symptoms may include: Blood in the urine. Pain in the upper back or abdomen, just below the rib cage. You may feel pain on one or both sides of your body. Tiredness (fatigue). Weight loss that cannot be explained. Fever. How is this diagnosed? This condition may be diagnosed based on: Your symptoms. Your medical history. A physical exam. You may also have tests, such as: Blood and urine tests. X-rays. Imaging tests, such as CT scans, MRIs, and PET scans. Angiogram. This is when dye is injected into your blood to show your blood vessels. Intravenous pyelogram. This is when dye is injected into your blood to show your kidneys and the other organs that help with making and storing urine. Biopsy. This is when a piece of tissue is removed from your kidney and looked at under a microscope. Your cancer will be assessed (staged), based on how severe it is and how much it has spread. How is this treated? Treatment depends on the type  and stage of the cancer. Treatment may include: Surgery to remove: Just the tumor (nephron-sparing surgery). The entire kidney (nephrectomy). The kidney, some of the healthy tissue around it, nearby lymph nodes, and sometimes the adrenal gland (radical nephrectomy). Medicines to: Kill cancer cells (chemotherapy). Help your body's disease-fighting system (immune system) fight cancer cells. This is known as immunotherapy. Radiation therapy. This uses high-energy rays to kill cancer cells. Targeted therapy to only attack genes and proteins that allow a tumor to grow. This type of therapy tries to limit damage to your healthy cells. Cryoablation. This uses gas or liquid to freeze cancer cells. It is sent through a needle. Radiofrequency ablation. This uses high-energy radio waves to destroy cancer cells. It is sent through a needle-like probe. Embolization. This is a procedure to block the artery that supplies blood to the tumor. Follow these instructions at home: Eating and drinking Some of your treatments may affect your appetite and your ability to chew and swallow. If you have problems eating, or if you do not have an appetite, meet with an expert in diet and nutrition (dietitian). If you have side effects that affect eating, it may help to: Eat smaller meals more often. Eat bland or soft foods. Avoid foods that are hot, spicy, or hard to swallow. Drink shakes or supplements that are high in nutrition and calories. Do not drink alcohol. Lifestyle Do not use any products that contain nicotine or tobacco. These products include cigarettes, chewing tobacco, and vaping devices, such as e-cigarettes. If you need   help quitting, ask your health care provider. Get enough sleep. Most adults need 6-8 hours of sleep each night. During treatment, you may need more sleep. General instructions Take over-the-counter and prescription medicines only as told by your health care provider. Consider joining a  support group. This can help you cope with the stress of having kidney cancer. Work with your health care provider to manage any side effects of treatment. Keep all follow-up visits. Your health care provider will want to make sure your treatment is working. Where to find more information American Cancer Society: cancer.org National Cancer Institute (NCI): cancer.gov Contact a health care provider if: You bruise or bleed easily. You lose weight without trying. You have new or worse fatigue or weakness. You have a fever. Your pain suddenly increases. You have more blood in your urine. Your skin or the white parts of your eyes turn yellow (jaundice). Get help right away if: You have chest pain. You are short of breath. You have irregular heartbeats (palpitations). These symptoms may be an emergency. Get help right away. Call 911. Do not wait to see if the symptoms will go away. Do not drive yourself to the hospital. This information is not intended to replace advice given to you by your health care provider. Make sure you discuss any questions you have with your health care provider. Document Revised: 10/08/2021 Document Reviewed: 10/08/2021 Elsevier Patient Education  2023 [employer] Inc.  

## 2022-03-11 ENCOUNTER — Telehealth: Payer: Self-pay

## 2022-03-11 ENCOUNTER — Other Ambulatory Visit: Payer: Self-pay

## 2022-03-11 DIAGNOSIS — N1330 Unspecified hydronephrosis: Secondary | ICD-10-CM

## 2022-03-11 MED ORDER — MAGNESIUM CITRATE PO SOLN: 1.0000 | Freq: Once | ORAL | 0 refills | Status: AC

## 2022-03-11 NOTE — Telephone Encounter (Signed)
Message left on patient answering machine of medication and instructions.  Along with surgery date and to return call to office if any questions.  Pre op will reach out to patient to schedule pre op appt.

## 2022-03-17 ENCOUNTER — Ambulatory Visit (HOSPITAL_COMMUNITY): Payer: Medicare Other

## 2022-03-17 NOTE — Therapy (Incomplete)
OUTPATIENT PHYSICAL THERAPY CERVICAL EVALUATION   Patient Name: Dustin Walker MRN: [id number] DOB:November 22, 1950, 71 y.o., male Today's Date: 03/17/2022    Past Medical History:  Diagnosis Date   Allergic rhinitis    Arthritis    BCC (basal cell carcinoma) 04/02/2015   left upper eyelid, 06/12/15 MOHs   Chronic pain    left hand, crush injury in 2013   COPD (chronic obstructive pulmonary disease) (HCC)    GERD (gastroesophageal reflux disease)    Headache    migraines   SCC (squamous cell carcinoma) 10/29/2015   well diff, upper mid bac (CX3, 5FU)   Past Surgical History:  Procedure Laterality Date   COLONOSCOPY  2010   Dr. Gala Romney: tubular adenoma removed. was due for surveillance in 2015.   COLONOSCOPY WITH PROPOFOL N/A 02/22/2018   Procedure: COLONOSCOPY WITH PROPOFOL;  Surgeon: Daneil Dolin, MD;  [facility];  Service: Endoscopy;  Laterality: N/A;  12:00pm   CYSTOSCOPY Right 12/06/2021   Procedure: CYSTOSCOPY;  Surgeon: Cleon Gustin, MD;  Location: [facility];  Service: Urology;  Laterality: Right;   CYSTOSCOPY WITH RETROGRADE PYELOGRAM, URETEROSCOPY AND STENT PLACEMENT Right 02/24/2022   Procedure: CYSTOSCOPY WITH RETROGRADE PYELOGRAM,   AND STENT PLACEMENT;  Surgeon: Cleon Gustin, MD;  Location: [facility];  Service: Urology;  Laterality: Right;   HAND SURGERY Right    middle finger   I & D EXTREMITY  06/19/2011   Procedure: IRRIGATION AND DEBRIDEMENT EXTREMITY;  Surgeon: Dennie Bible, MD;  Location: [facility];  Service: Plastics;;   IR NEPHROSTOGRAM RIGHT THRU EXISTING ACCESS  02/10/2022   IR NEPHROSTOMY PLACEMENT RIGHT  12/20/2021   IR NEPHROSTOMY PLACEMENT RIGHT  02/05/2022   JOINT REPLACEMENT     knee   KNEE SURGERY Bilateral    6 times per knee and total knee replacement on the left   ORIF ULNAR FRACTURE  06/19/2011   Procedure: OPEN REDUCTION INTERNAL FIXATION (ORIF) ULNAR FRACTURE;  Surgeon: Dennie Bible, MD;  Location: [facility];  Service:  Plastics;  Laterality: Left;   SHOULDER SURGERY Right    SPINAL CORD STIMULATOR BATTERY EXCHANGE Left 02/11/2021   Procedure: REPLACEMENT PULSE GENERATOR LEFT FLANK (MEDTRONIC);  Surgeon: [name], MD;  Location: [facility];  Service: Neurosurgery;  Laterality: Left;  LOCAL W/ MAC   TRANSURETHRAL RESECTION OF BLADDER TUMOR N/A 12/06/2021   Procedure: TRANSURETHRAL RESECTION OF BLADDER TUMOR (TURBT);  Surgeon: Cleon Gustin, MD;  Location: [facility];  Service: Urology;  Laterality: N/A;   URETERAL BIOPSY Right 02/24/2022   Procedure: URETERAL BIOPSY;  Surgeon: Cleon Gustin, MD;  Location: [facility];  Service: Urology;  Laterality: Right;   Patient Active Problem List   Diagnosis Date Noted   Therapeutic opioid induced constipation 09/08/2017   Hx of adenomatous colonic polyps 09/08/2017   Chronic progressive renal failure, stage 3 (moderate) ([place]) 06/30/2017   Headache, common migraine 07/30/2016   Neuropathic pain of left hand 06/07/2015   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  Rationale for Evaluation and Treatment: Rehabilitation  ONSET DATE: ***  SUBJECTIVE:  SUBJECTIVE STATEMENT: ***  PERTINENT HISTORY:  ***  PAIN:  Are you having pain? {OPRCPAIN:27236}  PRECAUTIONS: {Therapy precautions:24002}  WEIGHT BEARING RESTRICTIONS: {Yes ***/No:24003}  FALLS:  Has patient fallen in last 6 months? {fallsyesno:27318}  LIVING  ENVIRONMENT: Lives with: {OPRC lives with:25569::"lives with their family"} Lives in: {Lives in:25570} Stairs: {opstairs:27293} Has following equipment at home: {Assistive devices:23999}  OCCUPATION: ***  PLOF: {PLOF:24004}  PATIENT GOALS: ***  NEXT MD VISIT:   OBJECTIVE:   DIAGNOSTIC FINDINGS:  ***  PATIENT SURVEYS:  {rehab surveys:24030}  COGNITION: Overall cognitive status: {cognition:24006}  SENSATION: {sensation:27233}  POSTURE: {posture:25561}  PALPATION: ***   CERVICAL ROM:   {AROM/PROM:27142} ROM A/PROM (deg) eval  Flexion   Extension   Right lateral flexion   Left lateral flexion   Right rotation   Left rotation    (Blank rows = not tested)  UPPER EXTREMITY ROM:  {AROM/PROM:27142} ROM Right eval Left eval  Shoulder flexion    Shoulder extension    Shoulder abduction    Shoulder adduction    Shoulder extension    Shoulder internal rotation    Shoulder external rotation    Elbow flexion    Elbow extension    Wrist flexion    Wrist extension    Wrist ulnar deviation    Wrist radial deviation    Wrist pronation    Wrist supination     (Blank rows = not tested)  UPPER EXTREMITY MMT:  MMT Right eval Left eval  Shoulder flexion    Shoulder extension    Shoulder abduction    Shoulder adduction    Shoulder extension    Shoulder internal rotation    Shoulder external rotation    Middle trapezius    Lower trapezius    Elbow flexion    Elbow extension    Wrist flexion    Wrist extension    Wrist ulnar deviation    Wrist radial deviation    Wrist pronation    Wrist supination    Grip strength     (Blank rows = not tested)  CERVICAL SPECIAL TESTS:  {Cervical special tests:25246}  FUNCTIONAL TESTS:  {Functional tests:24029}  TODAY'S TREATMENT:       physical therapy evaluation and HEP instruction                                                                                                                       DATE:  03/17/22   PATIENT EDUCATION:  Education details: Patient educated on exam findings, POC, scope of PT, HEP, and ***. Person educated: Patient Education method: Explanation, Demonstration, and Handouts Education comprehension: verbalized understanding, returned demonstration, verbal cues required, and tactile cues required  HOME EXERCISE PROGRAM: ***  ASSESSMENT:  CLINICAL IMPRESSION: Patient is a *** y.o. *** who was seen today for physical therapy evaluation and treatment for R51.9,G89.29 (ICD-10-CM) - Chronic intractable headache, unspecified headache type M54.2 (ICD-10-CM) - Cervicalgia G89.21 (ICD-10-CM) - Chronic pain due to trauma.   OBJECTIVE IMPAIRMENTS: {opptimpairments:25111}.  ACTIVITY LIMITATIONS: {activitylimitations:27494}  PARTICIPATION LIMITATIONS: {participationrestrictions:25113}  PERSONAL FACTORS: {Personal factors:25162} are also affecting patient's functional outcome.   REHAB POTENTIAL: {rehabpotential:25112}  CLINICAL DECISION MAKING: {clinical decision making:25114}  EVALUATION COMPLEXITY: {Evaluation complexity:25115}   GOALS: Goals reviewed with patient? No  SHORT TERM GOALS: Target date: {follow up:25551}   Patient will be independent with initial HEP  Baseline: *** Goal status: INITIAL  2.  *** Baseline: *** Goal status: {GOALSTATUS:25110}  3.  *** Baseline: *** Goal status: {GOALSTATUS:25110}  4.  *** Baseline: *** Goal status: {GOALSTATUS:25110}  5.  *** Baseline: *** Goal status: {GOALSTATUS:25110}  6.  *** Baseline: *** Goal status: {GOALSTATUS:25110}  LONG TERM GOALS: Target date: {follow up:25551}  Patient will be independent in self management strategies to improve quality of life and functional outcomes.  Baseline: *** Goal status: INITIAL  2.  *** Baseline: *** Goal status: {GOALSTATUS:25110}  3.  *** Baseline: *** Goal status: {GOALSTATUS:25110}  4.  *** Baseline: *** Goal status:  {GOALSTATUS:25110}  5.  *** Baseline: *** Goal status: {GOALSTATUS:25110}  6.  *** Baseline: *** Goal status: {GOALSTATUS:25110}   PLAN:  PT FREQUENCY: {rehab frequency:25116}  PT DURATION: {rehab duration:25117}  PLANNED INTERVENTIONS: Therapeutic exercises, Therapeutic activity, Neuromuscular re-education, Balance training, Gait training, Patient/Family education, Joint manipulation, Joint mobilization, Stair training, Orthotic/Fit training, DME instructions, Aquatic Therapy, Dry Needling, Electrical stimulation, Spinal manipulation, Spinal mobilization, Cryotherapy, Moist heat, Compression bandaging, scar mobilization, Splintting, Taping, Traction, Ultrasound, Ionotophoresis 18m/ml Dexamethasone, and Manual therapy  PLAN FOR NEXT SESSION: Review HEP and goals   7:20 AM, 03/17/22 Lashaundra Lehrmann Small Ritu Gagliardo MPT [facility] [place] #[phone]

## 2022-03-25 NOTE — Patient Instructions (Signed)
Dustin Walker  03/25/2022     '@[email or web address]'$ @   Your procedure is scheduled on  03/31/2022.   Report to [facility] at  [place]   Call this number if you have problems the morning of surgery:  [phone]  If you experience any cold or flu symptoms such as cough, fever, chills, shortness of breath, etc. between now and your scheduled surgery, please notify us at the above number.   Remember:  Do not eat or drink after midnight.      Take these medicines the morning of surgery with A SIP OF WATER        nurtec(if needed), oxycodone(if needed), protonix, topamax.     Do not wear jewelry, make-up or nail polish.  Do not wear lotions, powders, or perfumes, or deodorant.  Do not shave 48 hours prior to surgery.  Men may shave face and neck.  Do not bring valuables to the hospital.  [facility] is not responsible for any belongings or valuables.  Contacts, dentures or bridgework may not be worn into surgery.  Leave your suitcase in the car.  After surgery it may be brought to your room.  For patients admitted to the hospital, discharge time will be determined by your treatment team.  Patients discharged the day of surgery will not be allowed to drive home.    Special instructions:   DO NOT smoke tobacco or vape for 24 hours before your procedure.  Please read over the following fact sheets that you were given. Pain Booklet, Coughing and Deep Breathing, Blood Transfusion Information, Surgical Site Infection Prevention, Anesthesia Post-op Instructions, and Care and Recovery After Surgery      Minimally Invasive Nephrectomy, Care After This sheet gives you information about how to care for yourself after your procedure. Your health care provider may also give you more specific instructions. If you have problems or questions, contact your health care provider. What can I expect after the procedure? After the procedure, it is common to  have: Pain. Soreness and numbness in the area around your incisions. Follow these instructions at home: Medicines  Take over-the-counter and prescription medicines only as told by your health care provider. Avoid using NSAIDs regularly over a long period of time. These include aspirin and ibuprofen. Doing so may damage your remaining kidney. Ask your health care provider if the medicine prescribed to you: Requires you to avoid driving or using machinery. Can cause constipation. You may need to take these actions to prevent or treat constipation: Drink enough fluid to keep your urine pale yellow. Take over-the-counter or prescription medicines. Eat foods that are high in fiber, such as beans, whole grains, and fresh fruits and vegetables. Limit foods that are high in fat and processed sugars, such as fried or sweet foods. Incision care and drain care  Follow instructions from your health care provider about how to take care of your incisions. Make sure you: Wash your hands with soap and water for at least 20 seconds before and after you change your bandage (dressing). If soap and water are not available, use hand sanitizer. Change your dressing as told by your health care provider. Leave stitches (sutures), skin glue, or adhesive strips in place. These skin closures may need to be in place for 2 weeks or longer. If adhesive strip edges start to loosen and curl up, you may trim the loose edges. Do not remove adhesive strips completely  unless your health care provider tells you to do that. Check your incision area every day for signs of infection. Check for: Redness, swelling, or more pain. Fluid or blood. Warmth. Pus or a bad smell. If you have a drain in place, follow your health care provider's instructions for drain care. This may include emptying the drain and keeping track of the amount of drainage. Activity  Rest as told by your health care provider. Avoid sitting for a long time  without moving. Get up to take short walks every 1-2 hours. This is important to improve blood flow and breathing. Ask for help if you feel weak or unsteady. Do not lift anything that is heavier than 10 lb (4.5 kg), or the limit that you are told, until your health care provider says that it is safe. Do not play contact sports. Doing so may damage your remaining kidney. Return to your normal activities as told by your health care provider. Ask your health care provider what activities are safe for you. Catheter care If you have a urinary catheter, care for it as told by your health care provider. You may be told to: Wash your hands with soap and water for at least 20 seconds before and after touching the catheter, tubing, or drainage bag. If soap and water are not available, use hand sanitizer. Empty the drainage bag every 2-4 hours, or more often if needed. Do not let the bag get completely full. Monitor the amount and color of your urine as told by your health care provider. Keep the area around the catheter clean and dry. Make sure to keep the catheter secured to avoid pulling and accidental removal. Always keep the urine collection bag below the level of your bladder. Check the catheter tubing regularly to make sure there are no kinks or blockages. Make sure that the catheter is not placed under water. General instructions Do not take baths, swim, or use a hot tub until your health care provider approves. Ask your health care provider if you may take showers. You may only be allowed to take sponge baths. Do deep breathing exercises as told by your health care provider. These help to prevent lung infection (pneumonia). Wear compression stockings as told by your health care provider. These stockings help to prevent blood clots and reduce swelling in your legs. Keep all follow-up visits. This is important. Contact a health care provider if: You have a fever or chills. You have pain that is not  controlled by your pain medicine. You have redness, swelling, or more pain at an incision site. You have a cough. An incision is warm to the touch. You have blood in your urine. You have not had a bowel movement in 3 days. You have diarrhea. Get help right away if: You have trouble breathing or feel short of breath. You have chest pain. You have severe pain. There is fluid or blood coming from an incision. There is pus or a bad smell coming from an incision. You cannot urinate. You have warmth, redness, and tenderness in your leg. These symptoms may represent a serious problem that is an emergency. Do not wait to see if the symptoms will go away. Get medical help right away. Call your local emergency services (911 in the [place] Do not drive yourself to the hospital. Summary Follow instructions from your health care provider about how to take care of your incisions. Check your incision area every day for signs of infection. Take over-the-counter and  prescription medicines only as told by your health care provider. Return to your normal activities as told by your health care provider. Ask your health care provider what activities are safe for you. Keep all follow-up visits. This is important. This information is not intended to replace advice given to you by your health care provider. Make sure you discuss any questions you have with your health care provider. Document Revised: 08/22/2019 Document Reviewed: 08/22/2019 Elsevier Patient Education  [place]. Transurethral Resection of Bladder Tumor, Care After The following information offers guidance on how to care for yourself after your procedure. Your health care provider may also give you more specific instructions. If you have problems or questions, contact your health care provider. What can I expect after the procedure? After the procedure, it is common to have: A small amount of blood or small blood clots in your urine for  up to 2 weeks. Soreness or mild pain from your catheter. After your catheter is removed, you may have mild soreness, especially when urinating. A need to urinate often. Pain in your lower abdomen. Follow these instructions at home: Medicines  Take over-the-counter and prescription medicines only as told by your health care provider. If you were prescribed an antibiotic medicine, take it as told by your health care provider. Do not stop taking the antibiotic even if you start to feel better. Ask your health care provider if the medicine prescribed to you: Requires you to avoid driving or using machinery. Can cause constipation. You may need to take these actions to prevent or treat constipation: Drink enough fluid to keep your urine pale yellow. Take over-the-counter or prescription medicines. Eat foods that are high in fiber, such as beans, whole grains, and fresh fruits and vegetables. Limit foods that are high in fat and processed sugars, such as fried or sweet foods. Activity  If you were given a sedative during the procedure, it can affect you for several hours. Do not drive or operate machinery until your health care provider says that it is safe. Rest as told by your health care provider. Avoid sitting for a long time without moving. Get up to take short walks every 1-2 hours. This is important to improve blood flow and breathing. Ask for help if you feel weak or unsteady. Do not lift anything that is heavier than 10 lb (4.5 kg), or the limit that you are told, until your health care provider says that it is safe. Avoid intense physical activity for as long as told by your health care provider. Do not have sex until your health care provider approves. Return to your normal activities as told by your health care provider. Ask your health care provider what activities are safe for you. General instructions If you have a catheter, follow instructions from your health care provider about  caring for your catheter and your drainage bag. Do not drink alcohol for as long as told by your health care provider. This is especially important if you are taking prescription pain medicines. Do not use any products that contain nicotine or tobacco. These products include cigarettes, chewing tobacco, and vaping devices, such as e-cigarettes. If you need help quitting, ask your health care provider. Wear compression stockings as told by your health care provider. These stockings help to prevent blood clots and reduce swelling in your legs. Keep all follow-up visits. This is important. You will need to be followed closely with regular checks of your bladder and urethra (cystoscopies) to make  sure that the cancer does not come back. Contact a health care provider if: You have blood in your urine for more than 2 weeks. You become constipated. Signs of constipation may include: Having fewer than three bowel movements in a week. Difficulty having a bowel movement. Stools that are dry, hard, or larger than normal. You have a urinary catheter in place, and you have: Spasms or pain. Problems with your catheter or your catheter is blocked. Your catheter has been taken out but you are unable to urinate. You have signs of infection, such as: Fever or chills. Cloudy or bad-smelling urine. Get help right away if: You have severe abdominal pain that gets worse or does not improve with medicine. You have a lot of large blood clots in your urine. You develop swelling or pain in your leg. You have difficulty breathing. These symptoms may be an emergency. Get help right away. Call 911. Do not wait to see if the symptoms will go away. Do not drive yourself to the hospital. Summary After your procedure, it is common to have a small amount of blood or small blood clots in your urine, soreness or mild pain from your catheter, and pain in your lower abdomen. Take over-the-counter and prescription medicines  only as told by your health care provider. Rest as told by your health care provider. Follow your health care provider's instructions about returning to normal activities. Ask what activities are safe for you. If you have a catheter, follow instructions from your health care provider about caring for your catheter and your drainage bag. This information is not intended to replace advice given to you by your health care provider. Make sure you discuss any questions you have with your health care provider. Document Revised: 05/03/2021 Document Reviewed: 05/03/2021 Elsevier Patient Education  [place] Anesthesia, Adult, Care After The following information offers guidance on how to care for yourself after your procedure. Your health care provider may also give you more specific instructions. If you have problems or questions, contact your health care provider. What can I expect after the procedure? After the procedure, it is common for people to: Have pain or discomfort at the IV site. Have nausea or vomiting. Have a sore throat or hoarseness. Have trouble concentrating. Feel cold or chills. Feel weak, sleepy, or tired (fatigue). Have soreness and body aches. These can affect parts of the body that were not involved in surgery. Follow these instructions at home: For the time period you were told by your health care provider:  Rest. Do not participate in activities where you could fall or become injured. Do not drive or use machinery. Do not drink alcohol. Do not take sleeping pills or medicines that cause drowsiness. Do not make important decisions or sign legal documents. Do not take care of children on your own. General instructions Drink enough fluid to keep your urine pale yellow. If you have sleep apnea, surgery and certain medicines can increase your risk for breathing problems. Follow instructions from your health care provider about wearing your sleep  device: Anytime you are sleeping, including during daytime naps. While taking prescription pain medicines, sleeping medicines, or medicines that make you drowsy. Return to your normal activities as told by your health care provider. Ask your health care provider what activities are safe for you. Take over-the-counter and prescription medicines only as told by your health care provider. Do not use any products that contain nicotine or tobacco. These products include cigarettes, chewing  tobacco, and vaping devices, such as e-cigarettes. These can delay incision healing after surgery. If you need help quitting, ask your health care provider. Contact a health care provider if: You have nausea or vomiting that does not get better with medicine. You vomit every time you eat or drink. You have pain that does not get better with medicine. You cannot urinate or have bloody urine. You develop a skin rash. You have a fever. Get help right away if: You have trouble breathing. You have chest pain. You vomit blood. These symptoms may be an emergency. Get help right away. Call 911. Do not wait to see if the symptoms will go away. Do not drive yourself to the hospital. Summary After the procedure, it is common to have a sore throat, hoarseness, nausea, vomiting, or to feel weak, sleepy, or fatigue. For the time period you were told by your health care provider, do not drive or use machinery. Get help right away if you have difficulty breathing, have chest pain, or vomit blood. These symptoms may be an emergency. This information is not intended to replace advice given to you by your health care provider. Make sure you discuss any questions you have with your health care provider. Document Revised: 07/26/2021 Document Reviewed: 07/26/2021 Elsevier Patient Education  [place]. How to Use Chlorhexidine Before Surgery Chlorhexidine gluconate (CHG) is a germ-killing (antiseptic) solution that is  used to clean the skin. It can get rid of the bacteria that normally live on the skin and can keep them away for about 24 hours. To clean your skin with CHG, you may be given: A CHG solution to use in the shower or as part of a sponge bath. A prepackaged cloth that contains CHG. Cleaning your skin with CHG may help lower the risk for infection: While you are staying in the intensive care unit of the hospital. If you have a vascular access, such as a central line, to provide short-term or long-term access to your veins. If you have a catheter to drain urine from your bladder. If you are on a ventilator. A ventilator is a machine that helps you breathe by moving air in and out of your lungs. After surgery. What are the risks? Risks of using CHG include: A skin reaction. Hearing loss, if CHG gets in your ears and you have a perforated eardrum. Eye injury, if CHG gets in your eyes and is not rinsed out. The CHG product catching fire. Make sure that you avoid smoking and flames after applying CHG to your skin. Do not use CHG: If you have a chlorhexidine allergy or have previously reacted to chlorhexidine. On babies younger than 104 months of age. How to use CHG solution Use CHG only as told by your health care provider, and follow the instructions on the label. Use the full amount of CHG as directed. Usually, this is one bottle. During a shower Follow these steps when using CHG solution during a shower (unless your health care provider gives you different instructions): Start the shower. Use your normal soap and shampoo to wash your face and hair. Turn off the shower or move out of the shower stream. Pour the CHG onto a clean washcloth. Do not use any type of brush or rough-edged sponge. Starting at your neck, lather your body down to your toes. Make sure you follow these instructions: If you will be having surgery, pay special attention to the part of your body where you will be having  surgery. Scrub this area for at least 1 minute. Do not use CHG on your head or face. If the solution gets into your ears or eyes, rinse them well with water. Avoid your genital area. Avoid any areas of skin that have broken skin, cuts, or scrapes. Scrub your back and under your arms. Make sure to wash skin folds. Let the lather sit on your skin for 1-2 minutes or as long as told by your health care provider. Thoroughly rinse your entire body in the shower. Make sure that all body creases and crevices are rinsed well. Dry off with a clean towel. Do not put any substances on your body afterward--such as powder, lotion, or perfume--unless you are told to do so by your health care provider. Only use lotions that are recommended by the manufacturer. Put on clean clothes or pajamas. If it is the night before your surgery, sleep in clean sheets.  During a sponge bath Follow these steps when using CHG solution during a sponge bath (unless your health care provider gives you different instructions): Use your normal soap and shampoo to wash your face and hair. Pour the CHG onto a clean washcloth. Starting at your neck, lather your body down to your toes. Make sure you follow these instructions: If you will be having surgery, pay special attention to the part of your body where you will be having surgery. Scrub this area for at least 1 minute. Do not use CHG on your head or face. If the solution gets into your ears or eyes, rinse them well with water. Avoid your genital area. Avoid any areas of skin that have broken skin, cuts, or scrapes. Scrub your back and under your arms. Make sure to wash skin folds. Let the lather sit on your skin for 1-2 minutes or as long as told by your health care provider. Using a different clean, wet washcloth, thoroughly rinse your entire body. Make sure that all body creases and crevices are rinsed well. Dry off with a clean towel. Do not put any substances on your body  afterward--such as powder, lotion, or perfume--unless you are told to do so by your health care provider. Only use lotions that are recommended by the manufacturer. Put on clean clothes or pajamas. If it is the night before your surgery, sleep in clean sheets. How to use CHG prepackaged cloths Only use CHG cloths as told by your health care provider, and follow the instructions on the label. Use the CHG cloth on clean, dry skin. Do not use the CHG cloth on your head or face unless your health care provider tells you to. When washing with the CHG cloth: Avoid your genital area. Avoid any areas of skin that have broken skin, cuts, or scrapes. Before surgery Follow these steps when using a CHG cloth to clean before surgery (unless your health care provider gives you different instructions): Using the CHG cloth, vigorously scrub the part of your body where you will be having surgery. Scrub using a back-and-forth motion for 3 minutes. The area on your body should be completely wet with CHG when you are done scrubbing. Do not rinse. Discard the cloth and let the area air-dry. Do not put any substances on the area afterward, such as powder, lotion, or perfume. Put on clean clothes or pajamas. If it is the night before your surgery, sleep in clean sheets.  For general bathing Follow these steps when using CHG cloths for general bathing (unless your health care  provider gives you different instructions). Use a separate CHG cloth for each area of your body. Make sure you wash between any folds of skin and between your fingers and toes. Wash your body in the following order, switching to a new cloth after each step: The front of your neck, shoulders, and chest. Both of your arms, under your arms, and your hands. Your stomach and groin area, avoiding the genitals. Your right leg and foot. Your left leg and foot. The back of your neck, your back, and your buttocks. Do not rinse. Discard the cloth and  let the area air-dry. Do not put any substances on your body afterward--such as powder, lotion, or perfume--unless you are told to do so by your health care provider. Only use lotions that are recommended by the manufacturer. Put on clean clothes or pajamas. Contact a health care provider if: Your skin gets irritated after scrubbing. You have questions about using your solution or cloth. You swallow any chlorhexidine. Call your local poison control center (1-409-096-3126 in the [place] Get help right away if: Your eyes itch badly, or they become very red or swollen. Your skin itches badly and is red or swollen. Your hearing changes. You have trouble seeing. You have swelling or tingling in your mouth or throat. You have trouble breathing. These symptoms may represent a serious problem that is an emergency. Do not wait to see if the symptoms will go away. Get medical help right away. Call your local emergency services (911 in the [place] Do not drive yourself to the hospital. Summary Chlorhexidine gluconate (CHG) is a germ-killing (antiseptic) solution that is used to clean the skin. Cleaning your skin with CHG may help to lower your risk for infection. You may be given CHG to use for bathing. It may be in a bottle or in a prepackaged cloth to use on your skin. Carefully follow your health care provider's instructions and the instructions on the product label. Do not use CHG if you have a chlorhexidine allergy. Contact your health care provider if your skin gets irritated after scrubbing. This information is not intended to replace advice given to you by your health care provider. Make sure you discuss any questions you have with your health care provider. Document Revised: 08/26/2021 Document Reviewed: 07/09/2020 Elsevier Patient Education  [place].

## 2022-03-26 ENCOUNTER — Other Ambulatory Visit (HOSPITAL_COMMUNITY): Payer: Self-pay | Admitting: Adult Medicine

## 2022-03-26 DIAGNOSIS — Z122 Encounter for screening for malignant neoplasm of respiratory organs: Secondary | ICD-10-CM

## 2022-03-27 ENCOUNTER — Encounter (HOSPITAL_COMMUNITY): Payer: Self-pay

## 2022-03-27 ENCOUNTER — Encounter (HOSPITAL_COMMUNITY)
Admission: RE | Admit: 2022-03-27 | Discharge: 2022-03-27 | Disposition: A | Discharge status: Home / Self Care | Payer: Medicare Other | Source: Ambulatory Visit | Attending: Urology | Admitting: Urology

## 2022-03-27 DIAGNOSIS — J441 Chronic obstructive pulmonary disease with (acute) exacerbation: Secondary | ICD-10-CM | POA: Insufficient documentation

## 2022-03-27 DIAGNOSIS — C661 Malignant neoplasm of right ureter: Secondary | ICD-10-CM | POA: Insufficient documentation

## 2022-03-27 DIAGNOSIS — I517 Cardiomegaly: Secondary | ICD-10-CM | POA: Insufficient documentation

## 2022-03-27 DIAGNOSIS — Z01818 Encounter for other preprocedural examination: Secondary | ICD-10-CM | POA: Diagnosis present

## 2022-03-27 DIAGNOSIS — N183 Chronic kidney disease, stage 3 unspecified: Secondary | ICD-10-CM | POA: Diagnosis not present

## 2022-03-27 LAB — CBC WITH DIFFERENTIAL/PLATELET
Abs Immature Granulocytes: 0.01 10*3/uL (ref 0.00–0.07)
Basophils Absolute: 0 10*3/uL (ref 0.0–0.1)
Basophils Relative: 1 %
Eosinophils Absolute: 0.2 10*3/uL (ref 0.0–0.5)
Eosinophils Relative: 4 %
HCT: 42.6 % (ref 39.0–52.0)
Hemoglobin: 13.9 g/dL (ref 13.0–17.0)
Immature Granulocytes: 0 %
Lymphocytes Relative: 32 %
Lymphs Abs: 1.9 10*3/uL (ref 0.7–4.0)
MCH: 30.9 pg (ref 26.0–34.0)
MCHC: 32.6 g/dL (ref 30.0–36.0)
MCV: 94.7 fL (ref 80.0–100.0)
Monocytes Absolute: 0.4 10*3/uL (ref 0.1–1.0)
Monocytes Relative: 7 %
Neutro Abs: 3.2 10*3/uL (ref 1.7–7.7)
Neutrophils Relative %: 56 %
Platelets: 203 10*3/uL (ref 150–400)
RBC: 4.5 MIL/uL (ref 4.22–5.81)
RDW: 13.6 % (ref 11.5–15.5)
WBC: 5.8 10*3/uL (ref 4.0–10.5)
nRBC: 0 % (ref 0.0–0.2)

## 2022-03-27 LAB — TYPE AND SCREEN
ABO/RH(D): O POS
Antibody Screen: NEGATIVE

## 2022-03-31 ENCOUNTER — Encounter (HOSPITAL_COMMUNITY): Payer: Self-pay | Admitting: Urology

## 2022-03-31 ENCOUNTER — Encounter (HOSPITAL_COMMUNITY)
Admission: RE | Disposition: A | Discharge status: Home Health | Payer: Self-pay | Source: Home / Self Care | Attending: Urology

## 2022-03-31 ENCOUNTER — Other Ambulatory Visit: Payer: Self-pay

## 2022-03-31 ENCOUNTER — Inpatient Hospital Stay (HOSPITAL_COMMUNITY)
Admission: RE | Admit: 2022-03-31 | Discharge: 2022-04-04 | DRG: 657 | Disposition: A | Discharge status: Home Health | Payer: Medicare Other | Attending: Urology | Admitting: Urology

## 2022-03-31 ENCOUNTER — Inpatient Hospital Stay (HOSPITAL_COMMUNITY): Payer: Medicare Other | Admitting: Anesthesiology

## 2022-03-31 DIAGNOSIS — Z85828 Personal history of other malignant neoplasm of skin: Secondary | ICD-10-CM

## 2022-03-31 DIAGNOSIS — F17210 Nicotine dependence, cigarettes, uncomplicated: Secondary | ICD-10-CM

## 2022-03-31 DIAGNOSIS — K219 Gastro-esophageal reflux disease without esophagitis: Secondary | ICD-10-CM | POA: Diagnosis present

## 2022-03-31 DIAGNOSIS — N1330 Unspecified hydronephrosis: Secondary | ICD-10-CM | POA: Diagnosis present

## 2022-03-31 DIAGNOSIS — K660 Peritoneal adhesions (postprocedural) (postinfection): Secondary | ICD-10-CM | POA: Diagnosis present

## 2022-03-31 DIAGNOSIS — J439 Emphysema, unspecified: Secondary | ICD-10-CM | POA: Diagnosis present

## 2022-03-31 DIAGNOSIS — N189 Chronic kidney disease, unspecified: Secondary | ICD-10-CM

## 2022-03-31 DIAGNOSIS — N400 Enlarged prostate without lower urinary tract symptoms: Secondary | ICD-10-CM | POA: Diagnosis present

## 2022-03-31 DIAGNOSIS — Z681 Body mass index (BMI) 19 or less, adult: Secondary | ICD-10-CM

## 2022-03-31 DIAGNOSIS — C661 Malignant neoplasm of right ureter: Secondary | ICD-10-CM | POA: Diagnosis present

## 2022-03-31 DIAGNOSIS — C641 Malignant neoplasm of right kidney, except renal pelvis: Secondary | ICD-10-CM | POA: Diagnosis not present

## 2022-03-31 DIAGNOSIS — J449 Chronic obstructive pulmonary disease, unspecified: Secondary | ICD-10-CM | POA: Diagnosis not present

## 2022-03-31 DIAGNOSIS — R319 Hematuria, unspecified: Secondary | ICD-10-CM | POA: Diagnosis present

## 2022-03-31 DIAGNOSIS — C679 Malignant neoplasm of bladder, unspecified: Principal | ICD-10-CM

## 2022-03-31 DIAGNOSIS — E440 Moderate protein-calorie malnutrition: Secondary | ICD-10-CM | POA: Diagnosis present

## 2022-03-31 HISTORY — PX: CYSTECTOMY: SHX5119

## 2022-03-31 HISTORY — PX: CYSTOSCOPY: SHX5120

## 2022-03-31 HISTORY — PX: TRANSURETHRAL RESECTION OF BLADDER TUMOR: SHX2575

## 2022-03-31 HISTORY — PX: ROBOT ASSISTED LAPAROSCOPIC NEPHRECTOMY: SHX5140

## 2022-03-31 LAB — BASIC METABOLIC PANEL
Anion gap: 4 — ABNORMAL LOW (ref 5–15)
BUN: 25 mg/dL — ABNORMAL HIGH (ref 8–23)
CO2: 21 mmol/L — ABNORMAL LOW (ref 22–32)
Calcium: 7.7 mg/dL — ABNORMAL LOW (ref 8.9–10.3)
Chloride: 111 mmol/L (ref 98–111)
Creatinine, Ser: 1.67 mg/dL — ABNORMAL HIGH (ref 0.61–1.24)
GFR, Estimated: 44 mL/min — ABNORMAL LOW (ref >60)
Glucose, Bld: 176 mg/dL — ABNORMAL HIGH (ref 70–99)
Potassium: 4.1 mmol/L (ref 3.5–5.1)
Sodium: 136 mmol/L (ref 135–145)

## 2022-03-31 LAB — CBC
HCT: 38.6 % — ABNORMAL LOW (ref 39.0–52.0)
Hemoglobin: 12.4 g/dL — ABNORMAL LOW (ref 13.0–17.0)
MCH: 30.8 pg (ref 26.0–34.0)
MCHC: 32.1 g/dL (ref 30.0–36.0)
MCV: 95.8 fL (ref 80.0–100.0)
Platelets: 177 10*3/uL (ref 150–400)
RBC: 4.03 MIL/uL — ABNORMAL LOW (ref 4.22–5.81)
RDW: 13.4 % (ref 11.5–15.5)
WBC: 12.5 10*3/uL — ABNORMAL HIGH (ref 4.0–10.5)
nRBC: 0 % (ref 0.0–0.2)

## 2022-03-31 SURGERY — NEPHRECTOMY, RADICAL, ROBOT-ASSISTED, LAPAROSCOPIC, ADULT
Anesthesia: General | Site: Flank | Laterality: Right

## 2022-03-31 MED ORDER — HYDROMORPHONE HCL 1 MG/ML IJ SOLN
INTRAMUSCULAR | Status: AC
  Filled 2022-03-31: qty 0.5

## 2022-03-31 MED ORDER — BUPIVACAINE LIPOSOME 1.3 % IJ SUSP
INTRAMUSCULAR | Status: AC
  Filled 2022-03-31: qty 20

## 2022-03-31 MED ORDER — OXYBUTYNIN CHLORIDE 5 MG PO TABS: 5.0000 mg | ORAL_TABLET | Freq: Three times a day (TID) | ORAL | Status: DC | PRN

## 2022-03-31 MED ORDER — OXYCODONE HCL 5 MG PO TABS
5.0000 mg | ORAL_TABLET | ORAL | Status: DC | PRN
  Administered 2022-04-01 – 2022-04-04 (×8): 5 mg via ORAL
  Filled 2022-03-31 (×8): qty 1

## 2022-03-31 MED ORDER — LACTATED RINGERS IV SOLN: INTRAVENOUS | Status: DC

## 2022-03-31 MED ORDER — HEMOSTATIC AGENTS (NO CHARGE) OPTIME
TOPICAL | Status: DC | PRN
  Administered 2022-03-31 (×2): 1 via TOPICAL

## 2022-03-31 MED ORDER — ONDANSETRON HCL 4 MG/2ML IJ SOLN
4.0000 mg | INTRAMUSCULAR | Status: DC | PRN
  Administered 2022-04-03: 4 mg via INTRAVENOUS
  Filled 2022-03-31: qty 2

## 2022-03-31 MED ORDER — ROCURONIUM 10MG/ML (10ML) SYRINGE FOR MEDFUSION PUMP - OPTIME
INTRAVENOUS | Status: DC | PRN
  Administered 2022-03-31: 50 mg via INTRAVENOUS
  Administered 2022-03-31: 20 mg via INTRAVENOUS

## 2022-03-31 MED ORDER — CEFAZOLIN SODIUM-DEXTROSE 2-4 GM/100ML-% IV SOLN
2.0000 g | INTRAVENOUS | Status: DC
  Administered 2022-03-31: 2 g via INTRAVENOUS
  Filled 2022-03-31: qty 100

## 2022-03-31 MED ORDER — POVIDONE-IODINE 10 % EX OINT
TOPICAL_OINTMENT | CUTANEOUS | Status: AC
  Filled 2022-03-31: qty 1

## 2022-03-31 MED ORDER — EPHEDRINE SULFATE (PRESSORS) 50 MG/ML IJ SOLN
INTRAMUSCULAR | Status: DC | PRN
  Administered 2022-03-31 (×2): 10 mg via INTRAVENOUS

## 2022-03-31 MED ORDER — PROPOFOL 10 MG/ML IV BOLUS
INTRAVENOUS | Status: AC
  Filled 2022-03-31: qty 20

## 2022-03-31 MED ORDER — DEXAMETHASONE SODIUM PHOSPHATE 10 MG/ML IJ SOLN
INTRAMUSCULAR | Status: AC
  Filled 2022-03-31: qty 1

## 2022-03-31 MED ORDER — ROCURONIUM BROMIDE 10 MG/ML (PF) SYRINGE
PREFILLED_SYRINGE | INTRAVENOUS | Status: AC
  Filled 2022-03-31: qty 40

## 2022-03-31 MED ORDER — MIDAZOLAM HCL 2 MG/2ML IJ SOLN
INTRAMUSCULAR | Status: AC
  Filled 2022-03-31: qty 2

## 2022-03-31 MED ORDER — ALBUTEROL SULFATE HFA 108 (90 BASE) MCG/ACT IN AERS: 2.0000 | INHALATION_SPRAY | Freq: Four times a day (QID) | RESPIRATORY_TRACT | Status: DC | PRN

## 2022-03-31 MED ORDER — SODIUM CHLORIDE 0.9 % IV SOLN: INTRAVENOUS | Status: DC

## 2022-03-31 MED ORDER — SODIUM CHLORIDE 0.9 % IR SOLN
Status: DC | PRN
  Administered 2022-03-31 (×2): 3000 mL

## 2022-03-31 MED ORDER — SENNOSIDES-DOCUSATE SODIUM 8.6-50 MG PO TABS
2.0000 | ORAL_TABLET | Freq: Every day | ORAL | Status: DC
  Administered 2022-03-31 – 2022-04-03 (×4): 2 via ORAL
  Filled 2022-03-31 (×4): qty 2

## 2022-03-31 MED ORDER — LIDOCAINE HCL (CARDIAC) PF 50 MG/5ML IV SOSY
PREFILLED_SYRINGE | INTRAVENOUS | Status: DC | PRN
  Administered 2022-03-31: 80 mg via INTRAVENOUS

## 2022-03-31 MED ORDER — CHLORHEXIDINE GLUCONATE CLOTH 2 % EX PADS
6.0000 | MEDICATED_PAD | Freq: Every day | CUTANEOUS | Status: DC
  Administered 2022-03-31 – 2022-04-04 (×6): 6 via TOPICAL

## 2022-03-31 MED ORDER — PROPOFOL 10 MG/ML IV BOLUS
INTRAVENOUS | Status: DC | PRN
  Administered 2022-03-31: 175 mg via INTRAVENOUS

## 2022-03-31 MED ORDER — FENTANYL CITRATE (PF) 250 MCG/5ML IJ SOLN
INTRAMUSCULAR | Status: AC
  Filled 2022-03-31: qty 5

## 2022-03-31 MED ORDER — HYDROMORPHONE HCL 1 MG/ML IJ SOLN: 0.5000 mg | INTRAMUSCULAR | Status: DC | PRN

## 2022-03-31 MED ORDER — FENTANYL CITRATE (PF) 100 MCG/2ML IJ SOLN
INTRAMUSCULAR | Status: DC | PRN
  Administered 2022-03-31 (×2): 50 ug via INTRAVENOUS

## 2022-03-31 MED ORDER — TAMSULOSIN HCL 0.4 MG PO CAPS
0.4000 mg | ORAL_CAPSULE | Freq: Every day | ORAL | Status: DC
  Administered 2022-04-01 – 2022-04-04 (×4): 0.4 mg via ORAL
  Filled 2022-03-31 (×4): qty 1

## 2022-03-31 MED ORDER — ONDANSETRON HCL 4 MG/2ML IJ SOLN
INTRAMUSCULAR | Status: AC
  Filled 2022-03-31: qty 4

## 2022-03-31 MED ORDER — ROCURONIUM BROMIDE 10 MG/ML (PF) SYRINGE
PREFILLED_SYRINGE | INTRAVENOUS | Status: AC
  Filled 2022-03-31: qty 10

## 2022-03-31 MED ORDER — ALBUTEROL SULFATE (2.5 MG/3ML) 0.083% IN NEBU: 2.5000 mg | INHALATION_SOLUTION | Freq: Four times a day (QID) | RESPIRATORY_TRACT | Status: DC | PRN

## 2022-03-31 MED ORDER — FENTANYL CITRATE PF 50 MCG/ML IJ SOSY: 25.0000 ug | PREFILLED_SYRINGE | INTRAMUSCULAR | Status: DC | PRN

## 2022-03-31 MED ORDER — HYDROMORPHONE HCL 1 MG/ML IJ SOLN
0.5000 mg | INTRAMUSCULAR | Status: AC | PRN
  Administered 2022-03-31 (×4): 0.5 mg via INTRAVENOUS
  Filled 2022-03-31 (×3): qty 0.5

## 2022-03-31 MED ORDER — STERILE WATER FOR IRRIGATION IR SOLN
Status: DC | PRN
  Administered 2022-03-31: 1000 mL
  Administered 2022-03-31: 500 mL

## 2022-03-31 MED ORDER — ORAL CARE MOUTH RINSE: 15.0000 mL | Freq: Once | OROMUCOSAL | Status: AC

## 2022-03-31 MED ORDER — SODIUM CHLORIDE 0.9 % IV SOLN
INTRAVENOUS | Status: DC | PRN
  Administered 2022-03-31: 60 mL

## 2022-03-31 MED ORDER — ONDANSETRON HCL 4 MG/2ML IJ SOLN
INTRAMUSCULAR | Status: AC
  Filled 2022-03-31: qty 2

## 2022-03-31 MED ORDER — PANTOPRAZOLE SODIUM 40 MG PO TBEC
40.0000 mg | DELAYED_RELEASE_TABLET | Freq: Every morning | ORAL | Status: DC
  Administered 2022-04-01 – 2022-04-04 (×4): 40 mg via ORAL
  Filled 2022-03-31 (×4): qty 1

## 2022-03-31 MED ORDER — TOPIRAMATE 25 MG PO TABS
50.0000 mg | ORAL_TABLET | Freq: Two times a day (BID) | ORAL | Status: DC
  Administered 2022-03-31 – 2022-04-04 (×8): 50 mg via ORAL
  Filled 2022-03-31 (×8): qty 2

## 2022-03-31 MED ORDER — NORTRIPTYLINE HCL 25 MG PO CAPS
100.0000 mg | ORAL_CAPSULE | Freq: Every day | ORAL | Status: DC
  Administered 2022-03-31 – 2022-04-03 (×4): 100 mg via ORAL
  Filled 2022-03-31 (×6): qty 4

## 2022-03-31 MED ORDER — HYDROMORPHONE HCL 1 MG/ML IJ SOLN
0.5000 mg | INTRAMUSCULAR | Status: DC | PRN
  Administered 2022-03-31 – 2022-04-04 (×13): 1 mg via INTRAVENOUS
  Filled 2022-03-31 (×13): qty 1

## 2022-03-31 MED ORDER — CHLORHEXIDINE GLUCONATE CLOTH 2 % EX PADS
6.0000 | MEDICATED_PAD | Freq: Once | CUTANEOUS | Status: DC
  Administered 2022-03-31: 6 via TOPICAL

## 2022-03-31 MED ORDER — CEFAZOLIN SODIUM-DEXTROSE 1-4 GM/50ML-% IV SOLN
1.0000 g | Freq: Three times a day (TID) | INTRAVENOUS | Status: AC
  Administered 2022-03-31 (×2): 1 g via INTRAVENOUS
  Filled 2022-03-31 (×3): qty 50

## 2022-03-31 MED ORDER — BUPIVACAINE HCL (PF) 0.25 % IJ SOLN
INTRAMUSCULAR | Status: AC
  Filled 2022-03-31: qty 30

## 2022-03-31 MED ORDER — CHLORHEXIDINE GLUCONATE 0.12 % MT SOLN
15.0000 mL | Freq: Once | OROMUCOSAL | Status: AC
  Administered 2022-03-31: 15 mL via OROMUCOSAL

## 2022-03-31 MED ORDER — SODIUM CHLORIDE (PF) 0.9 % IJ SOLN
INTRAMUSCULAR | Status: AC
  Filled 2022-03-31: qty 20

## 2022-03-31 MED ORDER — LIDOCAINE HCL (PF) 2 % IJ SOLN
INTRAMUSCULAR | Status: AC
  Filled 2022-03-31: qty 5

## 2022-03-31 MED ORDER — DEXAMETHASONE SODIUM PHOSPHATE 10 MG/ML IJ SOLN
INTRAMUSCULAR | Status: DC | PRN
  Administered 2022-03-31: 8 mg via INTRAVENOUS

## 2022-03-31 MED ORDER — ZOLPIDEM TARTRATE 5 MG PO TABS: 5.0000 mg | ORAL_TABLET | Freq: Every evening | ORAL | Status: DC | PRN

## 2022-03-31 MED ORDER — DIPHENHYDRAMINE HCL 50 MG/ML IJ SOLN: 12.5000 mg | Freq: Four times a day (QID) | INTRAMUSCULAR | Status: DC | PRN

## 2022-03-31 MED ORDER — ACETAMINOPHEN 325 MG PO TABS
650.0000 mg | ORAL_TABLET | ORAL | Status: DC | PRN
  Administered 2022-04-02 (×2): 650 mg via ORAL
  Filled 2022-03-31 (×2): qty 2

## 2022-03-31 MED ORDER — LIDOCAINE HCL (PF) 2 % IJ SOLN
INTRAMUSCULAR | Status: AC
  Filled 2022-03-31: qty 10

## 2022-03-31 MED ORDER — DIPHENHYDRAMINE HCL 12.5 MG/5ML PO ELIX: 12.5000 mg | ORAL_SOLUTION | Freq: Four times a day (QID) | ORAL | Status: DC | PRN

## 2022-03-31 MED ORDER — ONDANSETRON HCL 4 MG/2ML IJ SOLN
INTRAMUSCULAR | Status: DC | PRN
  Administered 2022-03-31: 4 mg via INTRAVENOUS

## 2022-03-31 MED ORDER — PHENYLEPHRINE HCL-NACL 20-0.9 MG/250ML-% IV SOLN
INTRAVENOUS | Status: DC | PRN
  Administered 2022-03-31: 20 ug/min via INTRAVENOUS

## 2022-03-31 MED ORDER — ONDANSETRON HCL 4 MG/2ML IJ SOLN: 4.0000 mg | Freq: Once | INTRAMUSCULAR | Status: DC | PRN

## 2022-03-31 MED ORDER — MIDAZOLAM HCL 5 MG/5ML IJ SOLN
INTRAMUSCULAR | Status: DC | PRN
  Administered 2022-03-31: 2 mg via INTRAVENOUS

## 2022-03-31 SURGICAL SUPPLY — 85 items
ADH SKN CLS APL DERMABOND .7 (GAUZE/BANDAGES/DRESSINGS) ×4
APL ESCP 73.6OZ SRGCL (TIP) ×2
APL PRP STRL LF DISP 70% ISPRP (MISCELLANEOUS) ×2
BAG DRAIN URO TABLE W/ADPT NS (BAG) ×3 IMPLANT
BAG DRN 8 ADPR NS SKTRN CSTL (BAG) ×2
BAG DRN RND TRDRP ANRFLXCHMBR (UROLOGICAL SUPPLIES) ×4
BAG URINE DRAIN 2000ML AR STRL (UROLOGICAL SUPPLIES) ×6 IMPLANT
BLADE SURG 15 STRL LF DISP TIS (BLADE) ×3 IMPLANT
BLADE SURG 15 STRL SS (BLADE) ×2
CATH FOL 3WAY LX 20X30 (CATHETERS) IMPLANT
CHLORAPREP W/TINT 26 (MISCELLANEOUS) ×3 IMPLANT
CLIP LIGATING HEM O LOK PURPLE (MISCELLANEOUS) ×6 IMPLANT
CLOTH BEACON ORANGE TIMEOUT ST (SAFETY) ×3 IMPLANT
COVER LIGHT HANDLE STERIS (MISCELLANEOUS) IMPLANT
COVER TIP SHEARS 8 DVNC (MISCELLANEOUS) ×3 IMPLANT
COVER TIP SHEARS 8MM DA VINCI (MISCELLANEOUS) ×2
CUTTER FLEX LINEAR 45M (STAPLE) IMPLANT
DERMABOND ADVANCED .7 DNX12 (GAUZE/BANDAGES/DRESSINGS) ×3 IMPLANT
DRAIN CHANNEL RND F F (WOUND CARE) IMPLANT
DRAPE ARM DVNC X/XI (DISPOSABLE) ×12 IMPLANT
DRAPE COLUMN DVNC XI (DISPOSABLE) ×3 IMPLANT
DRAPE DA VINCI XI ARM (DISPOSABLE) ×8
DRAPE DA VINCI XI COLUMN (DISPOSABLE) ×2
DRAPE HALF SHEET 40X57 (DRAPES) IMPLANT
DRAPE INCISE IOBAN 66X45 STRL (DRAPES) ×3 IMPLANT
ELECT LOOP 22F BIPOLAR SML (ELECTROSURGICAL) ×2
ELECT PENCIL ROCKER SW 15FT (MISCELLANEOUS) IMPLANT
ELECT REM PT RETURN 15FT ADLT (MISCELLANEOUS) ×3 IMPLANT
ELECTRODE LOOP 22F BIPOLAR SML (ELECTROSURGICAL) ×3 IMPLANT
EVACUATOR DRAINAGE 10X20 100CC (DRAIN) IMPLANT
EVACUATOR SILICONE 100CC (DRAIN) ×2
GAUZE SPONGE 4X4 12PLY STRL (GAUZE/BANDAGES/DRESSINGS) ×3 IMPLANT
GLOVE BIO SURGEON STRL SZ8 (GLOVE) ×6 IMPLANT
GLOVE BIOGEL PI IND STRL 7.0 (GLOVE) ×9 IMPLANT
GLOVE BIOGEL PI IND STRL 8 (GLOVE) ×6 IMPLANT
GLOVE SURG SS PI 7.0 STRL IVOR (GLOVE) IMPLANT
GLOVE SURG SS PI 7.5 STRL IVOR (GLOVE) IMPLANT
GOWN STRL REUS W/TWL LRG LVL3 (GOWN DISPOSABLE) ×6 IMPLANT
GOWN STRL REUS W/TWL XL LVL3 (GOWN DISPOSABLE) ×6 IMPLANT
IV NS IRRIG 3000ML ARTHROMATIC (IV SOLUTION) ×6 IMPLANT
KIT TURNOVER CYSTO (KITS) ×3 IMPLANT
MANIFOLD NEPTUNE II (INSTRUMENTS) ×3 IMPLANT
NDL HYPO 21X1.5 SAFETY (NEEDLE) IMPLANT
NDL INSUFFLATION 14GA 120MM (NEEDLE) ×3 IMPLANT
NEEDLE HYPO 21X1.5 SAFETY (NEEDLE) ×2 IMPLANT
NEEDLE INSUFFLATION 14GA 120MM (NEEDLE) ×2 IMPLANT
PACK CYSTO (CUSTOM PROCEDURE TRAY) ×3 IMPLANT
PACK LAP CHOLE LZT030E (CUSTOM PROCEDURE TRAY) ×3 IMPLANT
PAD ARMBOARD 7.5X6 YLW CONV (MISCELLANEOUS) ×3 IMPLANT
PLUG CATH AND CAP STER (CATHETERS) IMPLANT
POWDER SURGICEL 3.0 GRAM (HEMOSTASIS) IMPLANT
RELOAD 45 VASCULAR/THIN (ENDOMECHANICALS) ×4 IMPLANT
RELOAD STAPLE 45 2.5 WHT GRN (ENDOMECHANICALS) IMPLANT
RELOAD STAPLE 45 2.6 WHT THIN (STAPLE) IMPLANT
SEAL CANN UNIV 5-8 DVNC XI (MISCELLANEOUS) ×12 IMPLANT
SEAL XI 5MM-8MM UNIVERSAL (MISCELLANEOUS) ×8
SET BASIN LINEN APH (SET/KITS/TRAYS/PACK) ×3 IMPLANT
SET TUBE SMOKE EVAC HIGH FLOW (TUBING) ×3 IMPLANT
SOL ANTI FOG 6CC (MISCELLANEOUS) IMPLANT
SOLUTION ELECTROLUBE (MISCELLANEOUS) ×3 IMPLANT
SPONGE T-LAP 4X18 ~~LOC~~+RFID (SPONGE) IMPLANT
STAPLE RELOAD 45 WHT (STAPLE) ×4 IMPLANT
STAPLE RELOAD 45MM WHITE (STAPLE) ×4
SUT ETHILON 3 0 PS 1 (SUTURE) IMPLANT
SUT MNCRL AB 4-0 PS2 18 (SUTURE) IMPLANT
SUT PDS AB 0 CTX 60 (SUTURE) IMPLANT
SUT V-LOC BARB 180 2/0GR6 GS22 (SUTURE) ×2
SUT VIC AB 2-0 CT1 27 (SUTURE) ×2
SUT VIC AB 2-0 CT1 TAPERPNT 27 (SUTURE) IMPLANT
SUT VICRYL 0 UR6 27IN ABS (SUTURE) IMPLANT
SUTURE V-LC BRB 180 2/0GR6GS22 (SUTURE) IMPLANT
SYR 20ML LL LF (SYRINGE) ×6 IMPLANT
SYR 30ML LL (SYRINGE) ×3 IMPLANT
SYR BULB IRRIG 60ML STRL (SYRINGE) ×3 IMPLANT
SYR TOOMEY IRRIG 70ML (MISCELLANEOUS) ×2
SYRINGE TOOMEY IRRIG 70ML (MISCELLANEOUS) ×3 IMPLANT
SYS BAG RETRIEVAL 10MM (BASKET) ×2
SYSTEM BAG RETRIEVAL 10MM (BASKET) IMPLANT
TIP ENDOSCOPIC SURGICEL (TIP) IMPLANT
TOWEL OR 17X26 4PK STRL BLUE (TOWEL DISPOSABLE) ×3 IMPLANT
TROCAR Z-THAD FIOS HNDL 12X100 (TROCAR) IMPLANT
TROCAR Z-THREAD FIOS 5X100MM (TROCAR) ×3 IMPLANT
TUBE CONNECTING 12X1/4 (SUCTIONS) IMPLANT
WATER STERILE IRR 1000ML POUR (IV SOLUTION) ×3 IMPLANT
WATER STERILE IRR 500ML POUR (IV SOLUTION) ×3 IMPLANT

## 2022-03-31 NOTE — Anesthesia Preprocedure Evaluation (Signed)
Anesthesia Evaluation  Patient identified by MRN, date of birth, ID band Patient awake    Reviewed: Allergy & Precautions, H&P , NPO status , Patient's Chart, lab work & pertinent test results, reviewed documented beta blocker date and time   Airway Mallampati: II  TM Distance: >3 FB Neck ROM: full    Dental no notable dental hx.    Pulmonary COPD, Current Smoker and Patient abstained from smoking.   Pulmonary exam normal breath sounds clear to auscultation       Cardiovascular Exercise Tolerance: Good negative cardio ROS  Rhythm:regular Rate:Normal     Neuro/Psych  Headaches  negative psych ROS   GI/Hepatic Neg liver ROS,GERD  Medicated,,  Endo/Other  negative endocrine ROS    Renal/GU CRFRenal disease  negative genitourinary   Musculoskeletal   Abdominal   Peds  Hematology negative hematology ROS (+)   Anesthesia Other Findings   Reproductive/Obstetrics negative OB ROS                             Anesthesia Physical Anesthesia Plan  ASA: 3  Anesthesia Plan: General and General ETT   Post-op Pain Management: Dilaudid IV   Induction:   PONV Risk Score and Plan: Ondansetron  Airway Management Planned:   Additional Equipment: Arterial line  Intra-op Plan:   Post-operative Plan:   Informed Consent: I have reviewed the patients History and Physical, chart, labs and discussed the procedure including the risks, benefits and alternatives for the proposed anesthesia with the patient or authorized representative who has indicated his/her understanding and acceptance.     Dental Advisory Given  Plan Discussed with: CRNA  Anesthesia Plan Comments:         Anesthesia Quick Evaluation

## 2022-03-31 NOTE — Transfer of Care (Signed)
Immediate Anesthesia Transfer of Care Note  Patient: Dustin Walker  Procedure(s) Performed: XI ROBOTIC ASSISTED LAPAROSCOPIC NEPHRECTOMY/URETERCTOMY (Right: Flank) CYSTOSCOPY (Bladder) TRANSURETHRAL RESECTION OF BLADDER TUMOR (TURBT) (Bladder) CYSTECTOMY PARTIAL (Bladder)  Patient Location: PACU  Anesthesia Type:General  Level of Consciousness: awake  Airway & Oxygen Therapy: Patient Spontanous Breathing  Post-op Assessment: Report given to RN  Post vital signs: Reviewed and stable  Last Vitals:  Vitals Value Taken Time  BP 115/67 03/31/22 1300  Temp 36.3 C 03/31/22 1305  Pulse 51 03/31/22 1307  Resp 11 03/31/22 1307  SpO2 100 % 03/31/22 1307  Vitals shown include unvalidated device data.  Last Pain:  Vitals:   03/31/22 1253  PainSc: 9       Patients Stated Pain Goal: 5 (12/24/46 1856)  Complications: No notable events documented.

## 2022-03-31 NOTE — Progress Notes (Signed)
Patient received via stretcher from PACU, transferred self from stretcher to bed, foley cath in place with bloody output 100cc, JP drain RLQ of abdomen in place with dry dressing 25cc of bloody drainage emptied, pain 10/10 medicated with PRN dilaudid effective within 15 minutes noted by patient rating 4/10 and resting quietly with eyes closed. Patient able to move all extremities AROM, Tender abdominal area with guarding r/t surgical sites, patient repositioned for comfort, currently watching TV with call bell in reach.

## 2022-03-31 NOTE — Interval H&P Note (Signed)
History and Physical Interval Note:  03/31/2022 7:40 AM  Dustin Walker  has presented today for surgery, with the diagnosis of right ureteral Transitional cell cancer.  The various methods of treatment have been discussed with the patient and family. After consideration of risks, benefits and other options for treatment, the patient has consented to  Procedure(s): XI ROBOTIC ASSISTED LAPAROSCOPIC NEPHRECTOMY/URETERCTOMY (Right) CYSTOSCOPY (N/A) TRANSURETHRAL RESECTION OF BLADDER TUMOR (TURBT) (N/A) CYSTECTOMY PARTIAL (N/A) as a surgical intervention.  The patient's history has been reviewed, patient examined, no change in status, stable for surgery.  I have reviewed the patient's chart and labs.  Questions were answered to the patient's satisfaction.     [name]

## 2022-03-31 NOTE — Anesthesia Procedure Notes (Signed)
Procedure Name: Intubation Date/Time: 03/31/2022 9:14 AM  Performed by: Ollen Bowl, CRNAPre-anesthesia Checklist: Patient identified, Patient being monitored, Timeout performed, Emergency Drugs available and Suction available Patient Re-evaluated:Patient Re-evaluated prior to induction Oxygen Delivery Method: Circle system utilized Preoxygenation: Pre-oxygenation with 100% oxygen Induction Type: IV induction Ventilation: Mask ventilation without difficulty Laryngoscope Size: Mac and 3 Grade View: Grade II Tube type: Oral Tube size: 7.5 mm Number of attempts: 1 Airway Equipment and Method: Stylet Placement Confirmation: ETT inserted through vocal cords under direct vision, positive ETCO2 and breath sounds checked- equal and bilateral Secured at: 22 cm Tube secured with: Tape Dental Injury: Teeth and Oropharynx as per pre-operative assessment

## 2022-03-31 NOTE — Plan of Care (Signed)
  Problem: Education: Goal: Knowledge of the prescribed therapeutic regimen will improve Outcome: Not Progressing   Problem: Bowel/Gastric: Goal: Gastrointestinal status for postoperative course will improve Outcome: Not Progressing   Problem: Health Behavior/Discharge Planning: Goal: Identification of resources available to assist in meeting health care needs will improve Outcome: Not Progressing   Problem: Skin Integrity: Goal: Demonstration of wound healing without infection will improve Outcome: Not Progressing   Problem: Urinary Elimination: Goal: Ability to avoid or minimize complications of infection will improve Outcome: Not Progressing   Problem: Education: Goal: Knowledge of General Education information will improve Description: Including pain rating scale, medication(s)/side effects and non-pharmacologic comfort measures Outcome: Not Progressing   Problem: Health Behavior/Discharge Planning: Goal: Ability to manage health-related needs will improve Outcome: Not Progressing   Problem: Clinical Measurements: Goal: Ability to maintain clinical measurements within normal limits will improve Outcome: Not Progressing Goal: Will remain free from infection Outcome: Not Progressing Goal: Diagnostic test results will improve Outcome: Not Progressing Goal: Respiratory complications will improve Outcome: Not Progressing Goal: Cardiovascular complication will be avoided Outcome: Not Progressing   Problem: Activity: Goal: Risk for activity intolerance will decrease Outcome: Not Progressing   Problem: Nutrition: Goal: Adequate nutrition will be maintained Outcome: Not Progressing   Problem: Coping: Goal: Level of anxiety will decrease Outcome: Not Progressing   Problem: Elimination: Goal: Will not experience complications related to bowel motility Outcome: Not Progressing Goal: Will not experience complications related to urinary retention Outcome: Not  Progressing   Problem: Pain Managment: Goal: General experience of comfort will improve Outcome: Not Progressing   Problem: Safety: Goal: Ability to remain free from injury will improve Outcome: Not Progressing   Problem: Skin Integrity: Goal: Risk for impaired skin integrity will decrease Outcome: Not Progressing

## 2022-03-31 NOTE — Anesthesia Procedure Notes (Signed)
Arterial Line Insertion Start/End11/20/2023 7:30 AM, 03/31/2022 7:36 AM Performed by: Myna Bright, CRNA, CRNA  Patient location: Pre-op. Preanesthetic checklist: patient identified, IV checked, risks and benefits discussed, surgical consent, monitors and equipment checked, pre-op evaluation and timeout performed Lidocaine 1% used for infiltration Left, radial was placed Catheter size: 20 G Hand hygiene performed  and maximum sterile barriers used  Allen's test indicative of satisfactory collateral circulation Attempts: 1 Procedure performed without using ultrasound guided technique. Following insertion, Biopatch and dressing applied. Post procedure assessment: normal  Patient tolerated the procedure well with no immediate complications.

## 2022-03-31 NOTE — Op Note (Addendum)
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  Right robot assisted laparoscopic radical nephroureterectomy 2.  transurethral resection of bladder tumor, medium   Attending: [name], MD   Assistant: Aviva Signs, MD   Anesthesia: General   Estimated blood loss: 50 cc   Drains: 22 French Foley catheter, JP drain   Specimens: 1. Bladder tumor 2. right radical nephroureterectomy and bladder cuff   Antibiotics: ancef   Findings: 3cm sessile tumor involving right ureteral orifice. No bladder cuff leak at 150cc. The assistant was utilized for suction, retraction, deploying vascular stapler, passing suture and deploying endocatch bag    Indications: Patient is a 71 year old with a history of right high grade ureteral TCC.  Marland Kitchen  After discussing treatment options patient decided to proceed with right robot assisted laparoscopic radical nephroureterectomy   Procedure in detail: Prior to procedure consent was obtained. Patient was brought to the operating room and briefing was done sure correct patient, correct procedure, correct site.  General anesthesia was in administered patient was placed in the dorsal lithotomy position. He genitalia was prepped and draped in the usual sterile fashion. A 14fench resectoscope was passed in to the urethral and then the bladder. We inspected the bladder and noted sessile tumor involving the right ureteral orifice. Using the bipolar resectoscope we removed the tumor. Hemostasis was obtained with electrocautery. We then irrigated the tumor chips from the bladder. Once this was complete we remove the scope and placed a 22 french foley. We then repositioned the patient into the left lateral decubitus position. Their abdomen and flank was then prepped and draped usual sterile fashion.  A Veress needle was used to obtain pneumoperitoneum.  Once pneumoperitoneum was reestablished to 15 mmHg we then  placed a 8 mm camera port lateral to the umbilicus at the latera; edge of rectus.  We then proceeded to place 4 more robotic ports. We then placed 2 assistant ports. We then docked the robot.  We then started this dissection by removing a extensive amount of anterior abdominal wall adhesions.  We then dissected along the white line of Toldt.  We then reflected the colon medially.  We then identified the psoas muscle.  Once this was done we traced it down to the iliac vessels and identified the ureter.  Once we identified the gonadal vein and ureter were then traced this to the renal hilum.  The renal vein and renal artery were skeletonized.  We did we identified one renal vein one renal artery.  We placed a hemolock clip at the base of the renal artery. Using the Ethicon power stapler we ligated the renal artery.  Once this was done we then used a second staple load to ligate the renal vein.  Once this was done we then freed the kidney from its lateral and posterior attachments. Once the kidney was freed we then proceeded to dissect the ureter further. We dissected it down to where it crossed the iliac vessels. We then dissected down to the bladder. We excised the ureter with appoximately a 2cm bladder cuff. We then closed the defect with a 2-0 V-lock suture in 2 layers. We then filed the bladder with 150cc and noted no leak. We then used a Endo Catch bag to remove the specimen.  Once the specimen was in the Endo Catch bag we then inspected the retroperitoneum and noted no residual bleeding. We then placed a JP drain in the right lower  quadrant robot port. This was secured to the skin with a 2-0 nylon. We then removed our instruments, undocked the robot, and released the pneumoperitoneum.  We then made a midline incision above the umbilicus to remove the specimen.  Once the specimen was removed we then closed the camera and assistant ports with 0 Vicryl in interrupted fashion.  We then closed the midline incision  with looped PDS in a running fashion.  We then closed the overlying skin with 2-0 Vicryl in running fashion.  These skin was then subcuticularly closed with 4-0 Monocryl.  We then placed Dermabond over all the incisions.  This included the procedure which resulted by the patient.   Complications: None   Condition: Stable, x-rayed, transferred to PACU.   Plan: Patient is to be admitted for inpatient stay. They will be started on a clear liquid diet POD#1. His foley will remain in place for 2 weeks and he will have a cystogram prior to removal.

## 2022-04-01 LAB — CBC
HCT: 32.8 % — ABNORMAL LOW (ref 39.0–52.0)
Hemoglobin: 10.3 g/dL — ABNORMAL LOW (ref 13.0–17.0)
MCH: 30.9 pg (ref 26.0–34.0)
MCHC: 31.4 g/dL (ref 30.0–36.0)
MCV: 98.5 fL (ref 80.0–100.0)
Platelets: 147 10*3/uL — ABNORMAL LOW (ref 150–400)
RBC: 3.33 MIL/uL — ABNORMAL LOW (ref 4.22–5.81)
RDW: 13.5 % (ref 11.5–15.5)
WBC: 8.6 10*3/uL (ref 4.0–10.5)
nRBC: 0 % (ref 0.0–0.2)

## 2022-04-01 LAB — BASIC METABOLIC PANEL
Anion gap: 3 — ABNORMAL LOW (ref 5–15)
BUN: 27 mg/dL — ABNORMAL HIGH (ref 8–23)
CO2: 19 mmol/L — ABNORMAL LOW (ref 22–32)
Calcium: 6.9 mg/dL — ABNORMAL LOW (ref 8.9–10.3)
Chloride: 115 mmol/L — ABNORMAL HIGH (ref 98–111)
Creatinine, Ser: 1.65 mg/dL — ABNORMAL HIGH (ref 0.61–1.24)
GFR, Estimated: 45 mL/min — ABNORMAL LOW (ref >60)
Glucose, Bld: 123 mg/dL — ABNORMAL HIGH (ref 70–99)
Potassium: 4.8 mmol/L (ref 3.5–5.1)
Sodium: 137 mmol/L (ref 135–145)

## 2022-04-01 LAB — HEMOGLOBIN AND HEMATOCRIT, BLOOD
HCT: 35.1 % — ABNORMAL LOW (ref 39.0–52.0)
Hemoglobin: 11.3 g/dL — ABNORMAL LOW (ref 13.0–17.0)

## 2022-04-01 MED ORDER — SODIUM CHLORIDE 0.9 % IV BOLUS
500.0000 mL | Freq: Once | INTRAVENOUS | Status: AC
  Administered 2022-04-01: 500 mL via INTRAVENOUS

## 2022-04-01 MED ORDER — ADULT MULTIVITAMIN W/MINERALS CH
1.0000 | ORAL_TABLET | Freq: Every day | ORAL | Status: DC
  Administered 2022-04-01 – 2022-04-04 (×4): 1 via ORAL
  Filled 2022-04-01 (×4): qty 1

## 2022-04-01 MED ORDER — BOOST / RESOURCE BREEZE PO LIQD CUSTOM
1.0000 | Freq: Three times a day (TID) | ORAL | Status: DC
  Administered 2022-04-01 – 2022-04-04 (×8): 1 via ORAL

## 2022-04-01 NOTE — TOC Progression Note (Signed)
  Transition of Care [facility]) Screening Note   Patient Details  Name: Dustin Walker Date of Birth: 11-May-1951   Transition of Care [facility]) CM/SW Contact:    Shade Flood, LCSW Phone Number: 04/01/2022, 12:05 PM    Transition of Care Department [facility]) has reviewed patient and no TOC needs have been identified at this time. We will continue to monitor patient advancement through interdisciplinary progression rounds. If new patient transition needs arise, please place a TOC consult.

## 2022-04-01 NOTE — Progress Notes (Signed)
Initial Nutrition Assessment  DOCUMENTATION CODES:   Non-severe (moderate) malnutrition in context of chronic illness  INTERVENTION:  Boost Breeze po TID, each supplement provides 250 kcal and 9 grams of protein   When patient is ready for full liquids - start Ensure Enlive TID  NUTRITION DIAGNOSIS:   Moderate Malnutrition related to chronic illness as evidenced by moderate fat depletion, moderate muscle depletion, severe muscle depletion.   GOAL:  Patient will meet greater than or equal to 90% of their needs   MONITOR:  Diet advancement, Supplement acceptance, Weight trends, Skin, Labs, I & O's, PO intake  REASON FOR ASSESSMENT:   Malnutrition Screening Tool    ASSESSMENT: Patient is a 71 yo male with hx of right ureteral transitional cell cancer.    11/20- pt underwent right robot assisted laparoscopic radical nephroureterectomy and transurethral resection of bladder tumor.   Patient diet advanced to clears. He denies nausea. Patient sister is bedside. Patient lives with wife and 2 grandchildren. Home diet is regular. Meal intake average 2 x daily. Patient has ensure at home and is agreeable to consume during hospitalization. He is at risk for worsening nutrition status given his limited nutrition intake and increased needs.   Weight history reflects range of 53-57 kg the past 2+ years.   Medications: senna-docusate, protonix.  IVF-NS@ 75 ml/hr stopped today at 1057       Latest Ref Rng & Units 04/01/2022    4:40 AM 03/31/2022    1:31 PM 03/03/2022   11:54 AM  BMP  Glucose 70 - 99 mg/dL 123  176  100   BUN 8 - 23 mg/dL 27  25  36   Creatinine 0.61 - 1.24 mg/dL 1.65  1.67  1.74   BUN/Creat Ratio 10 - 24   21   Sodium 135 - 145 mmol/L 137  136  141   Potassium 3.5 - 5.1 mmol/L 4.8  4.1  4.7   Chloride 98 - 111 mmol/L 115  111  108   CO2 22 - 32 mmol/L '19  21  19   '$ Calcium 8.9 - 10.3 mg/dL 6.9  7.7  8.9      NUTRITION - FOCUSED PHYSICAL EXAM:  Flowsheet Row  Most Recent Value  Orbital Region Moderate depletion  Thoracic and Lumbar Region Moderate depletion  Buccal Region Mild depletion  Temple Region Moderate depletion  Clavicle and Acromion Bone Region Moderate depletion  Dorsal Hand No depletion  Patellar Region Severe depletion  Anterior Thigh Region Severe depletion  Posterior Calf Region Unable to assess  Edema (RD Assessment) None  [BLE]  Hair Reviewed  Eyes Reviewed  Mouth Reviewed  Skin Reviewed  Nails Reviewed       Diet Order:   Diet Order             Diet clear liquid Room service appropriate? Yes; Fluid consistency: Thin  Diet effective now                   EDUCATION NEEDS:  Education needs have been addressed  Skin:  Skin Assessment: Skin Integrity Issues: Skin Integrity Issues:: Incisions Incisions: perineum and abdomen  Last BM:  11/19  Height:   Ht Readings from Last 1 Encounters:  03/31/22 '5\' 8"'$  (1.727 m)    Weight:   Wt Readings from Last 1 Encounters:  03/31/22 56.3 kg    Ideal Body Weight:   70 kg   BMI:  Body mass index is 18.87 kg/m.  Estimated Nutritional  Needs:   Kcal:  1281-1886  Protein:  95-105 gr  Fluid:  1.9-2.0 liters   Colman Cater MS,RD,CSG,LDN Contact: Shea Evans

## 2022-04-01 NOTE — Progress Notes (Signed)
1 Day Post-Op Subjective: Patient reports incisional pain and pain control good.Urine clear. No nausea/vomiting, negative flatus.   Objective: Vital signs in last 24 hours: Temp:  [95.8 F (35.4 C)-98.2 F (36.8 C)] 98 F (36.7 C) (11/21 0400) Pulse Rate:  [49-62] 51 (11/21 0630) Resp:  [7-16] 14 (11/21 0630) BP: (82-117)/(40-87) 83/49 (11/21 0630) SpO2:  [92 %-100 %] 97 % (11/21 0630) Arterial Line BP: (113-128)/(38-46) 113/38 (11/20 1430) Weight:  [56.3 kg] 56.3 kg (11/20 1515)  Intake/Output from previous day: 11/20 0701 - 11/21 0700 In: 4770.1 [P.O.:250; I.V.:1830.3; IV Piggyback:2619.8] Out: 805 [Urine:625; Drains:130; Blood:50] Intake/Output this shift: No intake/output data recorded.  Physical Exam:  General:alert, cooperative, and appears stated age GI: soft, non tender, normal bowel sounds, no palpable masses, no organomegaly, no inguinal hernia Male genitalia: not done Extremities: extremities normal, atraumatic, no cyanosis or edema  Lab Results: Recent Labs    03/31/22 1331 04/01/22 0104 04/01/22 0440  HGB 12.4* 11.3* 10.3*  HCT 38.6* 35.1* 32.8*   BMET Recent Labs    03/31/22 1331 04/01/22 0440  NA 136 137  K 4.1 4.8  CL 111 115*  CO2 21* 19*  GLUCOSE 176* 123*  BUN 25* 27*  CREATININE 1.67* 1.65*  CALCIUM 7.7* 6.9*   No results for input(s): "LABPT", "INR" in the last 72 hours. No results for input(s): "LABURIN" in the last 72 hours. Results for orders placed or performed in visit on 02/12/22  Microscopic Examination     Status: Abnormal   Collection Time: 02/12/22  3:19 PM   Urine  Result Value Ref Range Status   WBC, UA 11-30 (A) 0 - 5 /hpf Final   RBC, Urine 0-2 0 - 2 /hpf Final   Epithelial Cells (non renal) 0-10 0 - 10 /hpf Final   Bacteria, UA None seen None seen/Few Final    Studies/Results: No results found.  Assessment/Plan: POD#1 right nephroureterectomy Advance diet to clears Ambulate with assistance Transfer to Med  Surg bed Continue current pain control regiment   LOS: 1 day   [name] 04/01/2022, 8:08 AM

## 2022-04-01 NOTE — Anesthesia Postprocedure Evaluation (Signed)
Anesthesia Post Note  Patient: Dustin Walker  Procedure(s) Performed: XI ROBOTIC ASSISTED LAPAROSCOPIC NEPHRECTOMY/URETERCTOMY (Right: Flank) CYSTOSCOPY (Bladder) TRANSURETHRAL RESECTION OF BLADDER TUMOR (TURBT) (Bladder) CYSTECTOMY PARTIAL (Bladder)  Patient location during evaluation: Phase II Anesthesia Type: General Level of consciousness: awake Pain management: pain level controlled Vital Signs Assessment: post-procedure vital signs reviewed and stable Respiratory status: spontaneous breathing and respiratory function stable Cardiovascular status: blood pressure returned to baseline and stable Postop Assessment: no headache and no apparent nausea or vomiting Anesthetic complications: no Comments: Late entry   No notable events documented.   Last Vitals:  Vitals:   04/01/22 1100 04/01/22 1208  BP: 105/63   Pulse: 69   Resp: 14   Temp: 37.1 C 36.7 C  SpO2: 95%     Last Pain:  Vitals:   04/01/22 1208  TempSrc: Oral  PainSc:                  Louann Sjogren

## 2022-04-02 DIAGNOSIS — E440 Moderate protein-calorie malnutrition: Secondary | ICD-10-CM | POA: Insufficient documentation

## 2022-04-02 MED ORDER — MUPIROCIN 2 % EX OINT
1.0000 | TOPICAL_OINTMENT | Freq: Two times a day (BID) | CUTANEOUS | Status: DC
  Administered 2022-04-02 – 2022-04-04 (×5): 1 via NASAL
  Filled 2022-04-02 (×3): qty 22

## 2022-04-02 MED ORDER — ALUM & MAG HYDROXIDE-SIMETH 200-200-20 MG/5ML PO SUSP
15.0000 mL | ORAL | Status: DC | PRN
  Administered 2022-04-04: 15 mL via ORAL
  Filled 2022-04-02: qty 30

## 2022-04-02 MED ORDER — ENSURE ENLIVE PO LIQD: 237.0000 mL | Freq: Three times a day (TID) | ORAL | Status: DC

## 2022-04-02 MED ORDER — MAGNESIUM HYDROXIDE 400 MG/5ML PO SUSP
30.0000 mL | Freq: Every day | ORAL | Status: DC
  Administered 2022-04-02 – 2022-04-04 (×3): 30 mL via ORAL
  Filled 2022-04-02 (×3): qty 30

## 2022-04-02 NOTE — Progress Notes (Signed)
Dr. Alyson Ingles paged and made aware of copious secretions from the skin site of JP drain as well as patients lower abdomen red, firm and hot to touch.

## 2022-04-02 NOTE — Care Management Important Message (Signed)
Important Message  Patient Details  Name: Dustin Walker MRN: [id number] Date of Birth: 1950-08-16   Medicare Important Message Given:  N/A - LOS <3 / Initial given by admissions     Tommy Medal 04/02/2022, 1:23 PM

## 2022-04-02 NOTE — Plan of Care (Signed)
  Problem: Acute Rehab PT Goals(only PT should resolve) Goal: Pt Will Go Supine/Side To Sit Outcome: Progressing Flowsheets (Taken 04/02/2022 1608) Pt will go Supine/Side to Sit:  with modified independence  with supervision Goal: Patient Will Transfer Sit To/From Stand Outcome: Progressing Flowsheets (Taken 04/02/2022 1608) Patient will transfer sit to/from stand:  with modified independence  with supervision Goal: Pt Will Transfer Bed To Chair/Chair To Bed Outcome: Progressing Flowsheets (Taken 04/02/2022 1608) Pt will Transfer Bed to Chair/Chair to Bed:  with modified independence  with supervision Goal: Pt Will Ambulate Outcome: Progressing Flowsheets (Taken 04/02/2022 1608) Pt will Ambulate:  > 125 feet  with supervision  with modified independence  with rolling walker   4:09 PM, 04/02/22 Lonell Grandchild, MPT Physical Therapist with [facility] [phone] office [phone] mobile phone

## 2022-04-02 NOTE — Progress Notes (Signed)
Pt arrived to room #339 via wheelchair from ICU. Pt ambulatory with standby assist from [facility] to recliner, tolerated well. IVF continue per order with no s/s infiltration. Foley cath intact with clear yellow urine noted. JP intact to RLQ with serosanguinous drainage noted in bulb. Dressing to JP insertion site saturated with serosanguinous drainage that leaks down into pt's groin area. Old dressing removed and new gauze dressing applied to site. Redness and warmth noted to lower midline abd incision. No drainage noted but area blanched with touch. Redness marked for future monitoring. Pt oriented to room and safety procedures, states understanding.

## 2022-04-02 NOTE — Progress Notes (Signed)
2 Days Post-Op Subjective: Patient reports incisional pain and pain control good.Urine clear. No nausea/vomiting, negative flatus. Patient tolerating clear liquids   Objective: Vital signs in last 24 hours: Temp:  [98.3 F (36.8 C)-99.6 F (37.6 C)] 98.3 F (36.8 C) (11/22 1141) Pulse Rate:  [49-80] 65 (11/22 1300) Resp:  [11-18] 18 (11/22 1300) BP: (96-132)/(48-81) 118/60 (11/22 1300) SpO2:  [91 %-99 %] 97 % (11/22 1300) Weight:  [54.9 kg] 54.9 kg (11/22 0407)  Intake/Output from previous day: 11/21 0701 - 11/22 0700 In: 1304 [I.V.:1194] Out: 1130 [Urine:1000; Drains:130] Intake/Output this shift: Total I/O In: 145.5 [I.V.:145.5] Out: 970 [Urine:800; Drains:170]  Physical Exam:  General:alert, cooperative, and appears stated age GI: soft, non tender, normal bowel sounds, no palpable masses, no organomegaly, no inguinal hernia Male genitalia: not done Extremities: extremities normal, atraumatic, no cyanosis or edema  Lab Results: Recent Labs    03/31/22 1331 04/01/22 0104 04/01/22 0440  HGB 12.4* 11.3* 10.3*  HCT 38.6* 35.1* 32.8*   BMET Recent Labs    03/31/22 1331 04/01/22 0440  NA 136 137  K 4.1 4.8  CL 111 115*  CO2 21* 19*  GLUCOSE 176* 123*  BUN 25* 27*  CREATININE 1.67* 1.65*  CALCIUM 7.7* 6.9*   No results for input(s): "LABPT", "INR" in the last 72 hours. No results for input(s): "LABURIN" in the last 72 hours. Results for orders placed or performed in visit on 02/12/22  Microscopic Examination     Status: Abnormal   Collection Time: 02/12/22  3:19 PM   Urine  Result Value Ref Range Status   WBC, UA 11-30 (A) 0 - 5 /hpf Final   RBC, Urine 0-2 0 - 2 /hpf Final   Epithelial Cells (non renal) 0-10 0 - 10 /hpf Final   Bacteria, UA None seen None seen/Few Final    Studies/Results: No results found.  Assessment/Plan: POD#2 right nephroureterectomy Advance diet to regular Ambulate with assistance Transfer to Med Surg bed Continue current  pain control regiment PT/OT referral   LOS: 2 days   [name] 04/02/2022, 1:57 PM

## 2022-04-02 NOTE — Evaluation (Signed)
Physical Therapy Evaluation Patient Details Name: Dustin Walker MRN: [id number] DOB: February 17, 1951 Today's Date: 04/02/2022  History of Present Illness  Dustin Walker is a 71 y/o male, s/p 1.  Right robot assisted laparoscopic radical nephroureterectomy  2.  transurethral resection of bladder tumor, medium on 03/31/22, with the diagnosis of right ureteral Transitional cell cancer.   Clinical Impression  Patient demonstrates slow labored movement for getting into/out of bed due to increasing lower abdominal pain at surgical site and fair/good return for log rolling technique for sit to side lying and side lying to sitting.  Patient has to flex trunk to relive lower abdominal pain and required hand held assist when walking without AD, required the use of RW for safety demonstrating increased endurance/distance for ambulation without loss of balance and tolerated sitting up in chair to transported to 300 floor after therapy.  Patient will benefit from continued skilled physical therapy in hospital and recommended venue below to increase strength, balance, endurance for safe ADLs and gait.      Recommendations for follow up therapy are one component of a multi-disciplinary discharge planning process, led by the attending physician.  Recommendations may be updated based on patient status, additional functional criteria and insurance authorization.  Follow Up Recommendations Home health PT      Assistance Recommended at Discharge Set up Supervision/Assistance  Patient can return home with the following  A little help with walking and/or transfers;A little help with bathing/dressing/bathroom;Help with stairs or ramp for entrance;Assistance with cooking/housework    Equipment Recommendations None recommended by PT  Recommendations for Other Services       Functional Status Assessment Patient has had a recent decline in their functional status and demonstrates the ability to make significant  improvements in function in a reasonable and predictable amount of time.     Precautions / Restrictions Precautions Precautions: Fall Restrictions Weight Bearing Restrictions: No      Mobility  Bed Mobility Overal bed mobility: Needs Assistance Bed Mobility: Rolling, Sidelying to Sit, Sit to Sidelying Rolling: Min guard Sidelying to sit: Min guard, Min assist     Sit to sidelying: Min guard, Min assist General bed mobility comments: increased time, labored movement with c/o lower abdominal pain    Transfers Overall transfer level: Needs assistance Equipment used: Rolling walker (2 wheels), None Transfers: Sit to/from Stand, Bed to chair/wheelchair/BSC Sit to Stand: Min guard, Min assist   Step pivot transfers: Min guard, Min assist       General transfer comment: has to lean on nearby objects for support with flexed trunk due to increased lower abdominal pain, safer using RW    Ambulation/Gait   Gait Distance (Feet): 100 Feet Assistive device: Rolling walker (2 wheels) Gait Pattern/deviations: Decreased step length - right, Decreased step length - left, Decreased stride length, Trunk flexed Gait velocity: decreased     General Gait Details: can ambulate a few steps without AD requiring hand held assist mostly due to increased lower abdominal pain, safer using RW with increased endurance/distance for gait training without loss of balance  Stairs            Wheelchair Mobility    Modified Rankin (Stroke Patients Only)       Balance Overall balance assessment: Needs assistance Sitting-balance support: Feet supported, No upper extremity supported Sitting balance-Leahy Scale: Fair Sitting balance - Comments: fair/good seated at EOB   Standing balance support: During functional activity, No upper extremity supported Standing balance-Leahy Scale: Fair Standing balance  comment: fair/poor without AD, fair/good using RW                              Pertinent Vitals/Pain Pain Assessment Pain Assessment: 0-10 Pain Score: 6  Pain Location: lower abdominal area at surgical site Pain Descriptors / Indicators: Sore, Guarding, Grimacing Pain Intervention(s): Limited activity within patient's tolerance, Patient requesting pain meds-RN notified, RN gave pain meds during session, Premedicated before session    Home Living Family/patient expects to be discharged to:: Private residence Living Arrangements: Spouse/significant other Available Help at Discharge: Family;Available 24 hours/day Type of Home: House Home Access: Stairs to enter Entrance Stairs-Rails: Left Entrance Stairs-Number of Steps: 3   Home Layout: One level Home Equipment: Conservation officer, nature (2 wheels);Cane - single point;BSC/3in1      Prior Function Prior Level of Function : Independent/Modified Independent;Driving             Mobility Comments: Hydrographic surveyor without AD ADLs Comments: Independent     Hand Dominance        Extremity/Trunk Assessment   Upper Extremity Assessment Upper Extremity Assessment: Overall WFL for tasks assessed    Lower Extremity Assessment Lower Extremity Assessment: Generalized weakness    Cervical / Trunk Assessment Cervical / Trunk Assessment: Kyphotic  Communication   Communication: No difficulties  Cognition Arousal/Alertness: Awake/alert Behavior During Therapy: WFL for tasks assessed/performed Overall Cognitive Status: Within Functional Limits for tasks assessed                                          General Comments      Exercises     Assessment/Plan    PT Assessment Patient needs continued PT services  PT Problem List Decreased strength;Decreased activity tolerance;Decreased balance;Decreased mobility       PT Treatment Interventions DME instruction;Gait training;Stair training;Functional mobility training;Therapeutic activities;Therapeutic exercise;Patient/family  education;Balance training    PT Goals (Current goals can be found in the Care Plan section)  Acute Rehab PT Goals Patient Stated Goal: return home with family to assist PT Goal Formulation: With patient Time For Goal Achievement: 04/09/22 Potential to Achieve Goals: Good    Frequency Min 3X/week     Co-evaluation               AM-PAC PT "6 Clicks" Mobility  Outcome Measure Help needed turning from your back to your side while in a flat bed without using bedrails?: A Little Help needed moving from lying on your back to sitting on the side of a flat bed without using bedrails?: A Little Help needed moving to and from a bed to a chair (including a wheelchair)?: A Little Help needed standing up from a chair using your arms (e.g., wheelchair or bedside chair)?: A Little Help needed to walk in hospital room?: A Little Help needed climbing 3-5 steps with a railing? : A Lot 6 Click Score: 17    End of Session   Activity Tolerance: Patient tolerated treatment well;Patient limited by fatigue Patient left: in chair;with call bell/phone within reach Nurse Communication: Mobility status PT Visit Diagnosis: Unsteadiness on feet (R26.81);Other abnormalities of gait and mobility (R26.89);Muscle weakness (generalized) (M62.81)    Time: 1027-2536 PT Time Calculation (min) (ACUTE ONLY): 20 min   Charges:   PT Evaluation $PT Eval Moderate Complexity: 1 Mod PT Treatments $Therapeutic Activity: 8-22 mins  4:07 PM, 04/02/22 Lonell Grandchild, MPT Physical Therapist with [facility] 336 [phone] office [phone] mobile phone

## 2022-04-02 NOTE — TOC Progression Note (Signed)
Transition of Care [facility]) - Progression Note    Patient Details  Name: Dustin Walker MRN: [id number] Date of Birth: [birth year]  Transition of Care [facility]) CM/SW Contact  Salome Arnt, [place] Phone Number: 04/02/2022, 3:41 PM  Clinical Narrative:  PT recommends home health. Discussed with pt who has no preference on agency. Referred and accepted by [facility] with Alvis Lemmings. TOC will continue to follow.        Barriers to Discharge: Continued Medical Work up  Expected Discharge Plan and Services                                                 Social Determinants of Health (SDOH) Interventions    Readmission Risk Interventions     No data to display

## 2022-04-03 MED ORDER — BISACODYL 10 MG RE SUPP
10.0000 mg | Freq: Two times a day (BID) | RECTAL | Status: DC
  Administered 2022-04-03: 10 mg via RECTAL
  Filled 2022-04-03 (×3): qty 1

## 2022-04-03 MED ORDER — MAGNESIUM HYDROXIDE 400 MG/5ML PO SUSP
30.0000 mL | Freq: Every day | ORAL | Status: DC
  Filled 2022-04-03 (×2): qty 30

## 2022-04-03 MED ORDER — SULFAMETHOXAZOLE-TRIMETHOPRIM 800-160 MG PO TABS
1.0000 | ORAL_TABLET | Freq: Two times a day (BID) | ORAL | Status: DC
  Administered 2022-04-03 – 2022-04-04 (×3): 1 via ORAL
  Filled 2022-04-03 (×3): qty 1

## 2022-04-03 NOTE — TOC Transition Note (Signed)
Transition of Care [facility]) - CM/SW Discharge Note   Patient Details  Name: Dustin Walker MRN: [id number] Date of Birth: 1950-11-18  Transition of Care [facility]) CM/SW Contact:  Iona Beard, [place] Phone Number: 04/03/2022, 12:34 PM   Clinical Narrative:    MD placed [place] PT orders. CSW updated Tommi Rumps with Alvis Lemmings of plan for possible D/C home today. TOC signing off.   Final next level of care: [place] Barriers to Discharge: Barriers Resolved   Patient Goals and CMS Choice Patient states their goals for this hospitalization and ongoing recovery are:: return home CMS Medicare.gov Compare Post Acute Care list provided to:: Patient Choice offered to / list presented to : Patient  Discharge Placement                       Discharge Plan and Services                          HH Arranged: PT [facility] Agency: [place] Date [place]: 04/03/22   Representative spoke with at [place]: [place]) Interventions     Readmission Risk Interventions     No data to display

## 2022-04-03 NOTE — Progress Notes (Signed)
3 Days Post-Op Subjective: Patient reports incisional pain and pain control good.Urine clear. No nausea/vomiting, positive flatus. Patient tolerating regular diet. Increase JP output 350cc/24 hours  Objective: Vital signs in last 24 hours: Temp:  [97.9 F (36.6 C)-99.4 F (37.4 C)] 99.4 F (37.4 C) (11/23 0441) Pulse Rate:  [66-100] 96 (11/23 0441) Resp:  [15-20] 20 (11/23 0441) BP: (97-113)/(64-74) 113/70 (11/23 0441) SpO2:  [92 %-96 %] 92 % (11/23 0441)  Intake/Output from previous day: 11/22 0701 - 11/23 0700 In: 1408.1 [I.V.:1408.1] Out: 1575 [Urine:1250; Drains:325] Intake/Output this shift: No intake/output data recorded.  Physical Exam:  General:alert, cooperative, and appears stated age GI: soft, non tender, normal bowel sounds, no palpable masses, no organomegaly, no inguinal hernia Male genitalia: not done Extremities: extremities normal, atraumatic, no cyanosis or edema  Lab Results: Recent Labs    04/01/22 0104 04/01/22 0440  HGB 11.3* 10.3*  HCT 35.1* 32.8*   BMET Recent Labs    04/01/22 0440  NA 137  K 4.8  CL 115*  CO2 19*  GLUCOSE 123*  BUN 27*  CREATININE 1.65*  CALCIUM 6.9*   No results for input(s): "LABPT", "INR" in the last 72 hours. No results for input(s): "LABURIN" in the last 72 hours. Results for orders placed or performed in visit on 02/12/22  Microscopic Examination     Status: Abnormal   Collection Time: 02/12/22  3:19 PM   Urine  Result Value Ref Range Status   WBC, UA 11-30 (A) 0 - 5 /hpf Final   RBC, Urine 0-2 0 - 2 /hpf Final   Epithelial Cells (non renal) 0-10 0 - 10 /hpf Final   Bacteria, UA None seen None seen/Few Final    Studies/Results: No results found.  Assessment/Plan: POD#3 right nephroureterectomy  Regular diet Ambulate with assistance Dulcolax BID,  Continue current pain control regiment PT/OT Home [place] JP creatinine   LOS: 3 days   [name] 04/03/2022, 2:47 PM

## 2022-04-04 LAB — SURGICAL PATHOLOGY

## 2022-04-04 MED ORDER — OXYCODONE HCL 10 MG PO TABS: 10.0000 mg | ORAL_TABLET | ORAL | 0 refills | Status: DC | PRN

## 2022-04-04 MED ORDER — SULFAMETHOXAZOLE-TRIMETHOPRIM 800-160 MG PO TABS: 1.0000 | ORAL_TABLET | Freq: Two times a day (BID) | ORAL | 0 refills | Status: DC

## 2022-04-04 MED ORDER — ONDANSETRON HCL 4 MG PO TABS: 4.0000 mg | ORAL_TABLET | Freq: Every day | ORAL | 1 refills | Status: DC | PRN

## 2022-04-04 NOTE — Discharge Summary (Signed)
Physician Discharge Summary  Patient ID: Dustin Walker MRN: [id number] DOB/AGE: June 01, 1950 71 y.o.  Admit date: 03/31/2022 Discharge date: 04/04/2022  Admission Diagnoses:  Ureteral cancer, right [facility])  Discharge Diagnoses:  Principal Problem:   Ureteral cancer, right ([place]) Active Problems:   Malnutrition of moderate degree   Past Medical History:  Diagnosis Date   Allergic rhinitis    Arthritis    BCC (basal cell carcinoma) 04/02/2015   left upper eyelid, 06/12/15 MOHs   Chronic pain    left hand, crush injury in 2013   COPD (chronic obstructive pulmonary disease) ([place])    GERD (gastroesophageal reflux disease)    Headache    migraines   SCC (squamous cell carcinoma) 10/29/2015   well diff, upper mid bac (CX3, 5FU)    Surgeries: Procedure(s): XI ROBOTIC ASSISTED LAPAROSCOPIC NEPHRECTOMY/URETERCTOMY CYSTOSCOPY TRANSURETHRAL RESECTION OF BLADDER TUMOR (TURBT) CYSTECTOMY PARTIAL on 03/31/2022   Consultants (if any):   Discharged Condition: Improved  Hospital Course: Dustin Walker is an 71 y.o. male who was admitted 03/31/2022 with a diagnosis of Ureteral cancer, right ([place]) and went to the operating room on 03/31/2022 and underwent the above named procedures.    He was given perioperative antibiotics:  Anti-infectives (From admission, onward)    Start     Dose/Rate Route Frequency Ordered Stop   04/04/22 0000  sulfamethoxazole-trimethoprim (BACTRIM DS) 800-160 MG tablet        1 tablet Oral 2 times daily 04/04/22 1008     04/03/22 1345  sulfamethoxazole-trimethoprim (BACTRIM DS) 800-160 MG per tablet 1 tablet        1 tablet Oral Every 12 hours 04/03/22 1256     03/31/22 1600  ceFAZolin (ANCEF) IVPB 1 g/50 mL premix        1 g 100 mL/hr over 30 Minutes Intravenous Every 8 hours 03/31/22 1510 04/01/22 0006   03/31/22 0730  ceFAZolin (ANCEF) IVPB 2g/100 mL premix  Status:  Discontinued        2 g 200 mL/hr over 30 Minutes Intravenous 30 min pre-op 03/31/22  0714 03/31/22 0830     .  He was given sequential compression devices, early ambulation for DVT prophylaxis.  He benefited maximally from the hospital stay and there were no complications.    Recent vital signs:  Vitals:   04/03/22 2238 04/04/22 0317  BP: 111/74 107/74  Pulse: 95 85  Resp: 16 16  Temp: 98.3 F (36.8 C) 98.6 F (37 C)  SpO2: 96% 94%    Recent laboratory studies:  Lab Results  Component Value Date   HGB 10.3 (L) 04/01/2022   HGB 11.3 (L) 04/01/2022   HGB 12.4 (L) 03/31/2022   Lab Results  Component Value Date   WBC 8.6 04/01/2022   PLT 147 (L) 04/01/2022   Lab Results  Component Value Date   INR 1.1 02/05/2022   Lab Results  Component Value Date   NA 137 04/01/2022   K 4.8 04/01/2022   CL 115 (H) 04/01/2022   CO2 19 (L) 04/01/2022   BUN 27 (H) 04/01/2022   CREATININE 1.65 (H) 04/01/2022   GLUCOSE 123 (H) 04/01/2022    Discharge Medications:   Allergies as of 04/04/2022       Reactions   Naproxen Sodium Rash, Other (See Comments)   "Burns from inside out" Aleve   Codeine Rash, Other (See Comments)   Burning        Medication List     TAKE these medications  albuterol 108 (90 Base) MCG/ACT inhaler Commonly known as: VENTOLIN HFA Inhale 2 puffs into the lungs every 6 (six) hours as needed for wheezing or shortness of breath.   hydroxypropyl methylcellulose / hypromellose 2.5 % ophthalmic solution Commonly known as: ISOPTO TEARS / GONIOVISC Place 1 drop into both eyes as needed for dry eyes.   Magnesium 250 MG Tabs Take 250 mg by mouth 2 (two) times daily.   multivitamin with minerals Tabs tablet Take 1 tablet by mouth daily.   nortriptyline 50 MG capsule Commonly known as: PAMELOR Take 2 capsules (100 mg total) by mouth at bedtime.   Nurtec 75 MG Tbdp Generic drug: Rimegepant Sulfate Take 75 mg by mouth daily as needed (Migraine).   ondansetron 4 MG tablet Commonly known as: Zofran Take 1 tablet (4 mg total) by  mouth daily as needed for nausea or vomiting.   Oxycodone HCl 10 MG Tabs Take 1 tablet (10 mg total) by mouth every 4 (four) hours as needed for moderate pain.   oxyCODONE-acetaminophen 7.5-325 MG tablet Commonly known as: PERCOCET Take 1 tablet by mouth See admin instructions. Take three to 4 times a day What changed:  when to take this additional instructions   pantoprazole 40 MG tablet Commonly known as: PROTONIX Take 1 tablet (40 mg total) by mouth daily. What changed: when to take this   phenazopyridine 100 MG tablet Commonly known as: Pyridium Take 1 tablet (100 mg total) by mouth 3 (three) times daily as needed for pain.   sulfamethoxazole-trimethoprim 800-160 MG tablet Commonly known as: BACTRIM DS Take 1 tablet by mouth 2 (two) times daily.   tamsulosin 0.4 MG Caps capsule Commonly known as: FLOMAX Take 1 capsule (0.4 mg total) by mouth daily.   topiramate 50 MG tablet Commonly known as: TOPAMAX Take 50 mg by mouth 2 (two) times daily.        Diagnostic Studies: No results found.  Disposition: Discharge disposition: 06-Home-Health Care Svc       Discharge Instructions     Discharge patient   Complete by: As directed    Discharge disposition: 06-Home-Health Care Svc   Discharge patient date: 04/04/2022        Follow-up Information     [facility] [place] Follow up.   Specialty: Home Health Services Why: Will contact you to schedule home health visits. Contact information: [address] [place] [address] [phone]         Cleon Gustin, MD Follow up on 04/16/2022.   Specialty: Urology Why: 10:30 Contact information: [address]  [place] [address] [phone]                  Signed: [name] 04/04/2022, 10:10 AM

## 2022-04-04 NOTE — Care Management Important Message (Signed)
Important Message  Patient Details  Name: Dustin Walker MRN: [id number] Date of Birth: Oct 11, 1950   Medicare Important Message Given:  Yes (spoke with by phone at [phone] to review letter, no additonal copy needed)     Tommy Medal 04/04/2022, 10:51 AM

## 2022-04-08 ENCOUNTER — Encounter: Payer: Self-pay | Admitting: Urology

## 2022-04-08 ENCOUNTER — Encounter (HOSPITAL_COMMUNITY): Payer: Self-pay | Admitting: Urology

## 2022-04-14 ENCOUNTER — Encounter: Payer: Self-pay | Admitting: Urology

## 2022-04-14 ENCOUNTER — Ambulatory Visit (HOSPITAL_COMMUNITY)
Admission: RE | Admit: 2022-04-14 | Discharge: 2022-04-14 | Disposition: A | Discharge status: Home / Self Care | Payer: Medicare Other | Source: Ambulatory Visit | Attending: Urology | Admitting: Urology

## 2022-04-14 DIAGNOSIS — N1330 Unspecified hydronephrosis: Secondary | ICD-10-CM | POA: Diagnosis not present

## 2022-04-14 MED ORDER — IOTHALAMATE MEGLUMINE 17.2 % UR SOLN
URETHRAL | Status: AC
  Administered 2022-04-14: 250 mL via INTRAVESICAL
  Filled 2022-04-14: qty 500

## 2022-04-15 ENCOUNTER — Ambulatory Visit: Payer: Medicare Other | Admitting: Urology

## 2022-04-16 ENCOUNTER — Ambulatory Visit (INDEPENDENT_AMBULATORY_CARE_PROVIDER_SITE_OTHER): Payer: Medicare Other | Admitting: Urology

## 2022-04-16 VITALS — BP 98/60 | HR 98

## 2022-04-16 DIAGNOSIS — C641 Malignant neoplasm of right kidney, except renal pelvis: Secondary | ICD-10-CM | POA: Diagnosis not present

## 2022-04-16 MED ORDER — DOXYCYCLINE HYCLATE 100 MG PO CAPS: 100.0000 mg | ORAL_CAPSULE | Freq: Two times a day (BID) | ORAL | 0 refills | Status: DC

## 2022-04-16 NOTE — Progress Notes (Signed)
04/16/2022 11:23 AM   Dustin Walker 12-13-1950 [id number]  Referring provider: Dulce Sellar, MD [address] [place],  [place] [address]  Followup right ureteral cancer  HPI: Mr Griffith is a 71yo here for followup after right radical nephroureterectomy. Pathology High grade T1, ureteral margin positive but TURBT margin around ureter was negative.    PMH: Past Medical History:  Diagnosis Date   Allergic rhinitis    Arthritis    BCC (basal cell carcinoma) 04/02/2015   left upper eyelid, 06/12/15 MOHs   Chronic pain    left hand, crush injury in 2013   COPD (chronic obstructive pulmonary disease) (HCC)    GERD (gastroesophageal reflux disease)    Headache    migraines   SCC (squamous cell carcinoma) 10/29/2015   well diff, upper mid bac (CX3, 5FU)    Surgical History: Past Surgical History:  Procedure Laterality Date   COLONOSCOPY  2010   Dr. Gala Romney: tubular adenoma removed. was due for surveillance in 2015.   COLONOSCOPY WITH PROPOFOL N/A 02/22/2018   Procedure: COLONOSCOPY WITH PROPOFOL;  Surgeon: Daneil Dolin, MD;  [facility];  Service: Endoscopy;  Laterality: N/A;  12:00pm   CYSTECTOMY N/A 03/31/2022   Procedure: CYSTECTOMY PARTIAL;  Surgeon: Cleon Gustin, MD;  [facility];  Service: Urology;  Laterality: N/A;   CYSTOSCOPY Right 12/06/2021   Procedure: CYSTOSCOPY;  Surgeon: Cleon Gustin, MD;  [facility];  Service: Urology;  Laterality: Right;   CYSTOSCOPY N/A 03/31/2022   Procedure: CYSTOSCOPY;  Surgeon: Cleon Gustin, MD;  [facility];  Service: Urology;  Laterality: N/A;   CYSTOSCOPY WITH RETROGRADE PYELOGRAM, URETEROSCOPY AND STENT PLACEMENT Right 02/24/2022   Procedure: CYSTOSCOPY WITH RETROGRADE PYELOGRAM,   AND STENT PLACEMENT;  Surgeon: Cleon Gustin, MD;  [facility];  Service: Urology;  Laterality: Right;   HAND SURGERY Right    middle finger   I & D EXTREMITY  06/19/2011    Procedure: IRRIGATION AND DEBRIDEMENT EXTREMITY;  Surgeon: Dennie Bible, MD;  Location: [facility];  Service: Plastics;;   IR NEPHROSTOGRAM RIGHT THRU EXISTING ACCESS  02/10/2022   IR NEPHROSTOMY PLACEMENT RIGHT  12/20/2021   IR NEPHROSTOMY PLACEMENT RIGHT  02/05/2022   JOINT REPLACEMENT     knee   KNEE SURGERY Bilateral    6 times per knee and total knee replacement on the left   ORIF ULNAR FRACTURE  06/19/2011   Procedure: OPEN REDUCTION INTERNAL FIXATION (ORIF) ULNAR FRACTURE;  Surgeon: Dennie Bible, MD;  Location: [facility];  Service: Plastics;  Laterality: Left;   ROBOT ASSISTED LAPAROSCOPIC NEPHRECTOMY Right 03/31/2022   Procedure: XI ROBOTIC ASSISTED LAPAROSCOPIC NEPHRECTOMY/URETERCTOMY;  Surgeon: Cleon Gustin, MD;  [facility];  Service: Urology;  Laterality: Right;   SHOULDER SURGERY Right    SPINAL CORD STIMULATOR BATTERY EXCHANGE Left 02/11/2021   Procedure: REPLACEMENT PULSE GENERATOR LEFT FLANK (MEDTRONIC);  Surgeon: [name], MD;  Location: [facility];  Service: Neurosurgery;  Laterality: Left;  LOCAL W/ MAC   TRANSURETHRAL RESECTION OF BLADDER TUMOR N/A 12/06/2021   Procedure: TRANSURETHRAL RESECTION OF BLADDER TUMOR (TURBT);  Surgeon: Cleon Gustin, MD;  [facility];  Service: Urology;  Laterality: N/A;   TRANSURETHRAL RESECTION OF BLADDER TUMOR N/A 03/31/2022   Procedure: TRANSURETHRAL RESECTION OF BLADDER TUMOR (TURBT);  Surgeon: Cleon Gustin, MD;  [facility];  Service: Urology;  Laterality: N/A;   URETERAL BIOPSY Right 02/24/2022   Procedure: URETERAL  BIOPSY;  Surgeon: Cleon Gustin, MD;  [facility];  Service: Urology;  Laterality: Right;    Home Medications:  Allergies as of 04/16/2022       Reactions   Naproxen Sodium Rash, Other (See Comments)   "Burns from inside out" Aleve   Codeine Rash, Other (See Comments)   Burning        Medication List        Accurate as of April 16, 2022 11:23 AM. If you have any  questions, ask your nurse or doctor.          albuterol 108 (90 Base) MCG/ACT inhaler Commonly known as: VENTOLIN HFA Inhale 2 puffs into the lungs every 6 (six) hours as needed for wheezing or shortness of breath.   hydroxypropyl methylcellulose / hypromellose 2.5 % ophthalmic solution Commonly known as: ISOPTO TEARS / GONIOVISC Place 1 drop into both eyes as needed for dry eyes.   Magnesium 250 MG Tabs Take 250 mg by mouth 2 (two) times daily.   multivitamin with minerals Tabs tablet Take 1 tablet by mouth daily.   nortriptyline 50 MG capsule Commonly known as: PAMELOR Take 2 capsules (100 mg total) by mouth at bedtime.   Nurtec 75 MG Tbdp Generic drug: Rimegepant Sulfate Take 75 mg by mouth daily as needed (Migraine).   ondansetron 4 MG tablet Commonly known as: Zofran Take 1 tablet (4 mg total) by mouth daily as needed for nausea or vomiting.   Oxycodone HCl 10 MG Tabs Take 1 tablet (10 mg total) by mouth every 4 (four) hours as needed for moderate pain.   oxyCODONE-acetaminophen 7.5-325 MG tablet Commonly known as: PERCOCET Take 1 tablet by mouth See admin instructions. Take three to 4 times a day What changed:  when to take this additional instructions   pantoprazole 40 MG tablet Commonly known as: PROTONIX Take 1 tablet (40 mg total) by mouth daily. What changed: when to take this   phenazopyridine 100 MG tablet Commonly known as: Pyridium Take 1 tablet (100 mg total) by mouth 3 (three) times daily as needed for pain.   sulfamethoxazole-trimethoprim 800-160 MG tablet Commonly known as: BACTRIM DS Take 1 tablet by mouth 2 (two) times daily.   tamsulosin 0.4 MG Caps capsule Commonly known as: FLOMAX Take 1 capsule (0.4 mg total) by mouth daily.   topiramate 50 MG tablet Commonly known as: TOPAMAX Take 50 mg by mouth 2 (two) times daily.        Allergies:  Allergies  Allergen Reactions   Naproxen Sodium Rash and Other (See Comments)    "Burns  from inside out" Aleve   Codeine Rash and Other (See Comments)    Burning    Family History: Family History  Problem Relation Age of Onset   Colon cancer Neg Hx     Social History:  reports that he has been smoking cigarettes. He started smoking about 59 years ago. He has a 49.00 pack-year smoking history. He has never used smokeless tobacco. He reports that he does not currently use alcohol. He reports that he does not use drugs.  ROS: All other review of systems were reviewed and are negative except what is noted above in HPI  Physical Exam: BP 98/60   Pulse 98   Constitutional:  Alert and oriented, No acute distress. HEENT: [place] AT, moist mucus membranes.  Trachea midline, no masses. Cardiovascular: No clubbing, cyanosis, or edema. Respiratory: Normal respiratory effort, no increased work of breathing. GI: Abdomen is  soft, nontender, nondistended, no abdominal masses GU: No CVA tenderness.  Lymph: No cervical or inguinal lymphadenopathy. Skin: No rashes, bruises or suspicious lesions. Neurologic: Grossly intact, no focal deficits, moving all 4 extremities. Psychiatric: Normal mood and affect.  Laboratory Data: Lab Results  Component Value Date   WBC 8.6 04/01/2022   HGB 10.3 (L) 04/01/2022   HCT 32.8 (L) 04/01/2022   MCV 98.5 04/01/2022   PLT 147 (L) 04/01/2022    Lab Results  Component Value Date   CREATININE 1.65 (H) 04/01/2022    No results found for: "PSA"  No results found for: "TESTOSTERONE"  No results found for: "HGBA1C"  Urinalysis    Component Value Date/Time   COLORURINE YELLOW 06/26/2010 1000   APPEARANCEUR Hazy (A) 02/12/2022 1519   LABSPEC 1.007 06/26/2010 1000   PHURINE 6.5 06/26/2010 1000   GLUCOSEU Negative 02/12/2022 1519   HGBUR NEGATIVE 06/26/2010 1000   BILIRUBINUR Negative 02/12/2022 1519   KETONESUR NEGATIVE 06/26/2010 1000   PROTEINUR 3+ (A) 02/12/2022 1519   PROTEINUR NEGATIVE 06/26/2010 1000   UROBILINOGEN 0.2 06/26/2010 1000    NITRITE Negative 02/12/2022 1519   NITRITE NEGATIVE 06/26/2010 1000   LEUKOCYTESUR 1+ (A) 02/12/2022 1519    Lab Results  Component Value Date   LABMICR See below: 02/12/2022   WBCUA 11-30 (A) 02/12/2022   LABEPIT 0-10 02/12/2022   MUCUS Present 01/15/2022   BACTERIA None seen 02/12/2022    Pertinent Imaging: Cystogram: Images reviewed and discussed with the patient  Results for orders placed in visit on 10/18/21  Abdomen 1 view (KUB)  Narrative CLINICAL DATA:  Nephrolithiasis  EXAM: ABDOMEN - 1 VIEW  COMPARISON:  None Available.  FINDINGS: Nonobstructive bowel gas pattern. No free intraperitoneal gas. Large colonic stool burden. No definite nephro or urolithiasis. Mild vascular calcification noted within the pelvis. No organomegaly. Osseous structures are unremarkable.  IMPRESSION: No definite nephro or urolithiasis.  Large colonic stool burden.   Electronically Signed By: Fidela Salisbury M.D. On: 10/19/2021 21:30  No results found for this or any previous visit.  No results found for this or any previous visit.  No results found for this or any previous visit.  Results for orders placed during the hospital encounter of 11/27/21  Ultrasound renal complete  Narrative CLINICAL DATA:  hydronephrosis  EXAM: RENAL / URINARY TRACT ULTRASOUND COMPLETE  COMPARISON:  CT dated Sep 23, 2021; ultrasound in Sep 10, 2021  FINDINGS: Right Kidney:  Renal measurements: 8.9 x 5.2 x 4.6 cm = volume: 109 mL. Echogenicity within normal limits. Persistent severe hydronephrosis with mild cortical thinning. This persists postvoid. This is not significantly changed in comparison to prior ultrasound.  Left Kidney:  Renal measurements: 10.0 x 5.7 x 4.7 cm = volume: 141 mL. Echogenicity within normal limits. No mass or hydronephrosis visualized.  Bladder:  Decompressed and not visualized.  Other:  None.  IMPRESSION: Persistent severe RIGHT-sided  hydronephrosis, similar in comparison to prior ultrasound and CT. This persists postvoid. There is mild cortical thinning.   Electronically Signed By: Valentino Saxon M.D. On: 11/27/2021 17:18  No valid procedures specified. No results found for this or any previous visit.  Results for orders placed during the hospital encounter of 09/23/21  CT RENAL STONE STUDY  Narrative CLINICAL DATA:  Right flank pain. Right hydronephrosis. Chronic kidney disease.  EXAM: CT ABDOMEN AND PELVIS WITHOUT CONTRAST  TECHNIQUE: Multidetector CT imaging of the abdomen and pelvis was performed following the standard protocol without IV contrast.  RADIATION  DOSE REDUCTION: This exam was performed according to the departmental dose-optimization program which includes automated exposure control, adjustment of the mA and/or kV according to patient size and/or use of iterative reconstruction technique.  COMPARISON:  Ultrasound on 09/10/2021  FINDINGS: Lower chest: No acute findings. 6 mm pulmonary nodule in the lateral right lower lobe remains stable since earlier chest CT in 2020, consistent with benign etiology. Emphysema again noted in both lung bases.  Hepatobiliary: No mass visualized on this unenhanced exam. Gallbladder is unremarkable. No evidence of biliary ductal dilatation.  Pancreas: No mass or inflammatory process visualized on this unenhanced exam.  Spleen:  Within normal limits in size.  Adrenals/Urinary tract: Moderate diffuse right renal atrophy is seen. Moderate right hydroureteronephrosis is seen, with a 2 mm calculus in the region of right UVJ. Another small calculus is seen in the bladder in the region of the left UVJ, however there is no evidence of left-sided hydroureteronephrosis.  Stomach/Bowel: No evidence of obstruction, inflammatory process, or abnormal fluid collections.  Vascular/Lymphatic: No pathologically enlarged lymph nodes identified. No evidence  of abdominal aortic aneurysm. Aortic atherosclerotic calcification incidentally noted.  Reproductive:  Mildly enlarged prostate.  Other:  None.  Musculoskeletal:  No suspicious bone lesions identified.  IMPRESSION: Moderate right hydroureteronephrosis, with 2 mm calculus in bladder in region of right UVJ.  Moderate diffuse right renal atrophy.  Small bladder calculus in region of left UVJ, but no left-sided hydronephrosis  Mildly enlarged prostate.   Electronically Signed By: Marlaine Hind M.D. On: 09/24/2021 20:30   Assessment & Plan:    1. Transitional cell carcinoma of kidney, right (HCC) -Followup 3 months with Ct and/pelvis, CMP and CXR - Bladder Voiding Trial   No follow-ups on file.  [name], MD  [facility] [place]

## 2022-04-16 NOTE — Progress Notes (Signed)
Fill and Pull Catheter Removal  Patient is present today for a catheter removal.  Patient was cleaned and prepped in a sterile fashion 138m of sterile water/ saline was instilled into the bladder when the patient felt the urge to urinate. 262mof water was then drained from the balloon.  A 20FR foley cath was removed from the bladder no complications were noted .  Patient as then given some time to void on their own.  Patient can void  12576mn their own after some time.  Patient tolerated well.  Performed by: Sherrel Shafer LPN  Follow up/ Additional notes: Per MD note

## 2022-04-17 ENCOUNTER — Encounter: Payer: Self-pay | Admitting: Urology

## 2022-05-08 ENCOUNTER — Ambulatory Visit (HOSPITAL_COMMUNITY)
Admission: RE | Admit: 2022-05-08 | Discharge: 2022-05-08 | Disposition: A | Discharge status: Home / Self Care | Payer: Medicare Other | Source: Ambulatory Visit | Attending: Adult Medicine | Admitting: Adult Medicine

## 2022-05-08 DIAGNOSIS — Z122 Encounter for screening for malignant neoplasm of respiratory organs: Secondary | ICD-10-CM | POA: Diagnosis present

## 2022-05-08 DIAGNOSIS — I2510 Atherosclerotic heart disease of native coronary artery without angina pectoris: Secondary | ICD-10-CM | POA: Insufficient documentation

## 2022-05-08 DIAGNOSIS — J432 Centrilobular emphysema: Secondary | ICD-10-CM | POA: Diagnosis not present

## 2022-05-08 DIAGNOSIS — Z87891 Personal history of nicotine dependence: Secondary | ICD-10-CM | POA: Diagnosis not present

## 2022-05-08 DIAGNOSIS — I700 Atherosclerosis of aorta: Secondary | ICD-10-CM | POA: Diagnosis not present

## 2022-05-19 ENCOUNTER — Ambulatory Visit (HOSPITAL_COMMUNITY): Payer: Medicare Other | Attending: Neurology | Admitting: Physical Therapy

## 2022-05-19 DIAGNOSIS — G8921 Chronic pain due to trauma: Secondary | ICD-10-CM | POA: Insufficient documentation

## 2022-05-19 DIAGNOSIS — G8929 Other chronic pain: Secondary | ICD-10-CM | POA: Diagnosis not present

## 2022-05-19 DIAGNOSIS — R519 Headache, unspecified: Secondary | ICD-10-CM | POA: Insufficient documentation

## 2022-05-19 DIAGNOSIS — M542 Cervicalgia: Secondary | ICD-10-CM | POA: Insufficient documentation

## 2022-05-19 NOTE — Therapy (Signed)
OUTPATIENT PHYSICAL THERAPY CERVICAL EVALUATION   Patient Name: Dustin Walker MRN: [id number] DOB:1950/10/31, 72 y.o., male Today's Date: 05/19/2022  END OF SESSION:  PT End of Session - 05/19/22 1121     Visit Number 1    Number of Visits 1    Date for PT Re-Evaluation 05/19/22    Authorization Type [employer] Medicare    PT Start Time 1035    PT Stop Time 1100    PT Time Calculation (min) 25 min    Activity Tolerance Patient tolerated treatment well    Behavior During Therapy WFL for tasks assessed/performed             Past Medical History:  Diagnosis Date   Allergic rhinitis    Arthritis    BCC (basal cell carcinoma) 04/02/2015   left upper eyelid, 06/12/15 MOHs   Chronic pain    left hand, crush injury in 2013   COPD (chronic obstructive pulmonary disease) (HCC)    GERD (gastroesophageal reflux disease)    Headache    migraines   SCC (squamous cell carcinoma) 10/29/2015   well diff, upper mid bac (CX3, 5FU)   Past Surgical History:  Procedure Laterality Date   COLONOSCOPY  2010   Dr. Gala Romney: tubular adenoma removed. was due for surveillance in 2015.   COLONOSCOPY WITH PROPOFOL N/A 02/22/2018   Procedure: COLONOSCOPY WITH PROPOFOL;  Surgeon: Daneil Dolin, MD;  [facility];  Service: Endoscopy;  Laterality: N/A;  12:00pm   CYSTECTOMY N/A 03/31/2022   Procedure: CYSTECTOMY PARTIAL;  Surgeon: Cleon Gustin, MD;  [facility];  Service: Urology;  Laterality: N/A;   CYSTOSCOPY Right 12/06/2021   Procedure: CYSTOSCOPY;  Surgeon: Cleon Gustin, MD;  [facility];  Service: Urology;  Laterality: Right;   CYSTOSCOPY N/A 03/31/2022   Procedure: CYSTOSCOPY;  Surgeon: Cleon Gustin, MD;  [facility];  Service: Urology;  Laterality: N/A;   CYSTOSCOPY WITH RETROGRADE PYELOGRAM, URETEROSCOPY AND STENT PLACEMENT Right 02/24/2022   Procedure: CYSTOSCOPY WITH RETROGRADE PYELOGRAM,   AND STENT PLACEMENT;  Surgeon: Cleon Gustin, MD;   [facility];  Service: Urology;  Laterality: Right;   HAND SURGERY Right    middle finger   I & D EXTREMITY  06/19/2011   Procedure: IRRIGATION AND DEBRIDEMENT EXTREMITY;  Surgeon: Dennie Bible, MD;  Location: [place];  Service: Plastics;;   IR NEPHROSTOGRAM RIGHT THRU EXISTING ACCESS  02/10/2022   IR NEPHROSTOMY PLACEMENT RIGHT  12/20/2021   IR NEPHROSTOMY PLACEMENT RIGHT  02/05/2022   JOINT REPLACEMENT     knee   KNEE SURGERY Bilateral    6 times per knee and total knee replacement on the left   ORIF ULNAR FRACTURE  06/19/2011   Procedure: OPEN REDUCTION INTERNAL FIXATION (ORIF) ULNAR FRACTURE;  Surgeon: Dennie Bible, MD;  Location: [place];  Service: Plastics;  Laterality: Left;   ROBOT ASSISTED LAPAROSCOPIC NEPHRECTOMY Right 03/31/2022   Procedure: XI ROBOTIC ASSISTED LAPAROSCOPIC NEPHRECTOMY/URETERCTOMY;  Surgeon: Cleon Gustin, MD;  [facility];  Service: Urology;  Laterality: Right;   SHOULDER SURGERY Right    SPINAL CORD STIMULATOR BATTERY EXCHANGE Left 02/11/2021   Procedure: REPLACEMENT PULSE GENERATOR LEFT FLANK (MEDTRONIC);  Surgeon: [name], MD;  Location: [facility];  Service: Neurosurgery;  Laterality: Left;  LOCAL W/ MAC   TRANSURETHRAL RESECTION OF BLADDER TUMOR N/A 12/06/2021   Procedure: TRANSURETHRAL RESECTION OF BLADDER TUMOR (TURBT);  Surgeon: Cleon Gustin, MD;  [facility];  Service: Urology;  Laterality: N/A;   TRANSURETHRAL RESECTION OF BLADDER TUMOR N/A 03/31/2022   Procedure: TRANSURETHRAL RESECTION OF BLADDER TUMOR (TURBT);  Surgeon: Cleon Gustin, MD;  [facility];  Service: Urology;  Laterality: N/A;   URETERAL BIOPSY Right 02/24/2022   Procedure: URETERAL BIOPSY;  Surgeon: Cleon Gustin, MD;  [facility];  Service: Urology;  Laterality: Right;   Patient Active Problem List   Diagnosis Date Noted   Malnutrition of moderate degree 04/02/2022   Ureteral cancer, right ([place]) 03/31/2022   Therapeutic opioid  induced constipation 09/08/2017   Hx of adenomatous colonic polyps 09/08/2017   Chronic progressive renal failure, stage 3 (moderate) (HCC) 06/30/2017   Headache, common migraine 07/30/2016   Neuropathic pain of left hand 06/07/2015   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   COPD exacerbation ([place]) 03/21/2013    PCP: Winfred Burn MD   REFERRING PROVIDER: Shellia Carwin, MD  REFERRING DIAG: R51.9,G89.29 (ICD-10-CM) - Chronic intractable headache, unspecified headache type M54.2 (ICD-10-CM) - Cervicalgia G89.21 (ICD-10-CM) - Chronic pain due to trauma  THERAPY DIAG:  Cervicalgia  Rationale for Evaluation and Treatment: Rehabilitation  ONSET DATE: mid 2023  SUBJECTIVE:                                                                                                                                                                                                         SUBJECTIVE STATEMENT: Patient present to therapy with referral for neck pain. Patient states he was having severe neck pain that began insidiously last year. He reports history of kidney disease and recently had one of his kidneys removed in November. He says since this surgery his neck has not been bothering him at all ,other than a slight restriction in motion when he turns his head to the right.   PERTINENT HISTORY:  Kidney removed 03/2022  PAIN:  Are you having pain? No  PRECAUTIONS: None  WEIGHT BEARING RESTRICTIONS: No  FALLS:  Has patient fallen in last 6 months? No  LIVING ENVIRONMENT: Lives with: lives with their family and lives with their spouse Lives in: House/apartment Stairs: Yes: External: 3 steps; on left going up Has following equipment at home: None  OCCUPATION: Retired  PLOF: Independent with basic ADLs  PATIENT GOALS: None stated   NEXT MD VISIT: 12/06/22  OBJECTIVE:   DIAGNOSTIC FINDINGS:  MPRESSION: 1. No acute intracranial abnormality. 2. Mild encephalomalacia involving the  far posterior left frontal lobe, may represent sequela of remote injury or ischemia.   COGNITION: Overall cognitive status: Within  functional limits for tasks assessed  SENSATION: WFL  POSTURE: forward head  PALPATION: No noted TTP about cervical/ scapular area   CERVICAL ROM:   Active ROM A/PROM (deg) eval  Flexion 50  Extension 55  Right lateral flexion   Left lateral flexion   Right rotation 70  Left rotation 70   (Blank rows = not tested)  UPPER EXTREMITY ROM:  Bilat EU AROM WFL   UPPER EXTREMITY MMT:  MMT Right eval Left eval  Shoulder flexion 5 5  Shoulder extension    Shoulder abduction 5 5  Shoulder adduction    Shoulder extension    Shoulder internal rotation 5 5  Shoulder external rotation 5 5  Middle trapezius    Lower trapezius    Elbow flexion    Elbow extension    Wrist flexion    Wrist extension    Wrist ulnar deviation    Wrist radial deviation    Wrist pronation    Wrist supination    Grip strength     (Blank rows = not tested)  TODAY'S TREATMENT:                                                                                                                              DATE:   05/19/22 Eval    PATIENT EDUCATION:  Education details: on eval findings and HEP  Person educated: Patient Education method: Explanation Education comprehension: verbalized understanding  HOME EXERCISE PROGRAM: Access Code: [email or web address] URL: [email or web address] Date: 05/19/2022 Prepared by: Josue Hector  Exercises - Seated Cervical Rotation AROM  - 1 x daily - 7 x weekly - 1 sets - 10 reps - Seated Cervical Flexion AROM  - 1 x daily - 7 x weekly - 1 sets - 10 reps - Seated Cervical Sidebending AROM  - 1 x daily - 7 x weekly - 1 sets - 10 reps - Seated Scapular Retraction  - 1 x daily - 7 x weekly - 1 sets - 10 reps - 5 second hold - Standing Shoulder Row with Anchored Resistance  - 1 x daily - 7 x weekly - 2-3 sets - 10 reps -  Shoulder extension with resistance - Neutral  - 1 x daily - 7 x weekly - 2-3 sets - 10 reps  ASSESSMENT:  CLINICAL IMPRESSION: Patient is a 72 yo male who presents to therapy with referral for neck pain. Objective testing shows cervical and UE AROM and strength WFL. Patient does not have chief complaint involving neck at this time, other than slight perceived ROM limitation/ stiffness. Discussed exam findings and educated patient on daily HEP routine for neck ROM and postural strengthening. Patient will not need skilled therapy services at this time and presents for 1 x Eval only. Issued HEP handout and encouraged patient to follow up with any further questions or concerns.   OBJECTIVE IMPAIRMENTS: decreased ROM and pain.   ACTIVITY LIMITATIONS: carrying, lifting, sitting, and standing  PARTICIPATION LIMITATIONS: cleaning, laundry, driving, community activity, and yard work  PERSONAL FACTORS:  NA  are also affecting patient's functional outcome.   REHAB POTENTIAL: Excellent  CLINICAL DECISION MAKING: Stable/uncomplicated  EVALUATION COMPLEXITY: Low   GOALS: 1 x Eval only   PLAN:  PT FREQUENCY: Eval only   PT DURATION: Eval only   PLANNED INTERVENTIONS: Therapeutic exercises, Therapeutic activity, Neuromuscular re-education, Balance training, Gait training, Patient/Family education, Joint manipulation, Joint mobilization, Stair training, Aquatic Therapy, Dry Needling, Electrical stimulation, Spinal manipulation, Spinal mobilization, Cryotherapy, Moist heat, scar mobilization, Taping, Traction, Ultrasound, Biofeedback, Ionotophoresis 50m/ml Dexamethasone, and Manual therapy.   PLAN FOR NEXT SESSION: Eval only. Follow up with any further questions or concerns.  11:22 AM, 05/19/22 CJosue HectorPT DPT  Physical Therap[facility] ([phone]

## 2022-06-02 ENCOUNTER — Ambulatory Visit: Payer: Medicare Other | Admitting: Urology
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
## 2022-06-12 ENCOUNTER — Encounter: Payer: Self-pay | Admitting: Neurology

## 2022-06-12 ENCOUNTER — Ambulatory Visit: Payer: Medicare Other | Admitting: Neurology

## 2022-06-12 VITALS — BP 89/56 | HR 82 | Ht 68.0 in | Wt 109.0 lb

## 2022-06-12 DIAGNOSIS — M542 Cervicalgia: Secondary | ICD-10-CM

## 2022-06-12 DIAGNOSIS — G8921 Chronic pain due to trauma: Secondary | ICD-10-CM | POA: Diagnosis not present

## 2022-06-12 DIAGNOSIS — R519 Headache, unspecified: Secondary | ICD-10-CM

## 2022-06-12 DIAGNOSIS — G8929 Other chronic pain: Secondary | ICD-10-CM | POA: Diagnosis not present

## 2022-06-12 NOTE — Patient Instructions (Signed)
Plan: Migraine prevention:  Nortriptyline 100 mg daily, Topiramate 50 mg twice daily Migraine rescue:  Nurtec 75 mg as needed Limit use of pain relievers to no more than 2 days out of week to prevent risk of rebound or medication-overuse headache.  Return to clinic in 6 months  Please let me know if you have any questions or concerns in the meantime.  The physicians and staff at [facility] are committed to providing excellent care. You may receive a survey requesting feedback about your experience at our office. We strive to receive "very good" responses to the survey questions. If you feel that your experience would prevent you from giving the office a "very good " response, please contact our office to try to remedy the situation. We may be reached at [phone]. Thank you for taking the time out of your busy day to complete the survey.  Kai Levins, MD [facility]

## 2022-07-13 ENCOUNTER — Encounter: Payer: Self-pay | Admitting: Urology

## 2022-07-14 ENCOUNTER — Other Ambulatory Visit: Payer: Medicare Other

## 2022-07-14 ENCOUNTER — Other Ambulatory Visit: Payer: Self-pay

## 2022-07-14 DIAGNOSIS — C641 Malignant neoplasm of right kidney, except renal pelvis: Secondary | ICD-10-CM

## 2022-07-15 LAB — COMPREHENSIVE METABOLIC PANEL
ALT: 15 IU/L (ref 0–44)
AST: 19 IU/L (ref 0–40)
Albumin/Globulin Ratio: 1.5 (ref 1.2–2.2)
Albumin: 4.3 g/dL (ref 3.9–4.9)
Alkaline Phosphatase: 78 IU/L (ref 44–121)
BUN/Creatinine Ratio: 21 (ref 10–24)
BUN: 38 mg/dL — ABNORMAL HIGH (ref 8–27)
Bilirubin Total: 0.3 mg/dL (ref 0.0–1.2)
CO2: 21 mmol/L (ref 20–29)
Calcium: 9.2 mg/dL (ref 8.6–10.2)
Chloride: 109 mmol/L — ABNORMAL HIGH (ref 96–106)
Creatinine, Ser: 1.84 mg/dL — ABNORMAL HIGH (ref 0.76–1.27)
Globulin, Total: 2.8 g/dL (ref 1.5–4.5)
Glucose: 99 mg/dL (ref 70–99)
Potassium: 4.6 mmol/L (ref 3.5–5.2)
Sodium: 143 mmol/L (ref 134–144)
Total Protein: 7.1 g/dL (ref 6.0–8.5)
eGFR: 39 mL/min/{1.73_m2} — ABNORMAL LOW (ref >59)

## 2022-07-16 ENCOUNTER — Encounter (HOSPITAL_COMMUNITY): Payer: Self-pay | Admitting: Radiology

## 2022-07-16 ENCOUNTER — Ambulatory Visit (HOSPITAL_COMMUNITY)
Admission: RE | Admit: 2022-07-16 | Discharge: 2022-07-16 | Disposition: A | Discharge status: Home / Self Care | Payer: Medicare Other | Source: Ambulatory Visit | Attending: Urology | Admitting: Urology

## 2022-07-16 DIAGNOSIS — C641 Malignant neoplasm of right kidney, except renal pelvis: Secondary | ICD-10-CM

## 2022-07-16 MED ORDER — IOHEXOL 300 MG/ML  SOLN
100.0000 mL | Freq: Once | INTRAMUSCULAR | Status: AC | PRN
  Administered 2022-07-16: 100 mL via INTRAVENOUS

## 2022-07-21 ENCOUNTER — Ambulatory Visit: Payer: Medicare Other | Admitting: Urology

## 2022-07-21 VITALS — BP 105/56 | HR 90

## 2022-07-21 DIAGNOSIS — D494 Neoplasm of unspecified behavior of bladder: Secondary | ICD-10-CM | POA: Diagnosis not present

## 2022-07-21 DIAGNOSIS — C641 Malignant neoplasm of right kidney, except renal pelvis: Secondary | ICD-10-CM

## 2022-07-21 MED ORDER — CIPROFLOXACIN HCL 500 MG PO TABS
500.0000 mg | ORAL_TABLET | Freq: Once | ORAL | Status: AC
  Administered 2022-07-21: 500 mg via ORAL

## 2022-07-21 NOTE — Progress Notes (Signed)
I spoke with Dustin Walker. We have discussed possible surgery dates and 08/21/2022 was agreed upon by all parties. Patient given information about surgery date, what to expect pre-operatively and post operatively.    We discussed that a pre-op nurse will be calling to set up the pre-op visit that will take place prior to surgery. Informed patient that our office will communicate any additional care to be provided after surgery.    Patients questions or concerns were discussed during our call. Advised to call our office should there be any additional information, questions or concerns that arise. Patient verbalized understanding.

## 2022-07-21 NOTE — Progress Notes (Unsigned)
   07/21/22  CC: followup TCC   HPI: Dustin Walker is a 72yo here for followup for bladder cancer Blood pressure (!) 105/56, pulse 90. NED. A&Ox3.   No respiratory distress   Abd soft, NT, ND Normal phallus with bilateral descended testicles  Cystoscopy Procedure Note  Patient identification was confirmed, informed consent was obtained, and patient was prepped using Betadine solution.  Lidocaine jelly was administered per urethral meatus.     Pre-Procedure: - Inspection reveals a normal caliber ureteral meatus.  Procedure: The flexible cystoscope was introduced without difficulty - No urethral strictures/lesions are present. - Enlarged prostate  - Normal bladder neck - left ureteral orifice identified - 2cm papillary bladder tumor at previous right ureteral orifice - No bladder stones - No trabeculation     Post-Procedure: - Patient tolerated the procedure well  Assessment/ Plan: We discussed the management of bladder tumors inclduing transurethral resection. After discussing the procedure the patient wishes to proceed with surgery. Risks/benefits/alternatives discussed  No follow-ups on file.  [name], MD

## 2022-07-22 ENCOUNTER — Encounter: Payer: Self-pay | Admitting: Urology

## 2022-07-22 NOTE — Patient Instructions (Signed)

## 2022-07-28 ENCOUNTER — Encounter: Payer: Self-pay | Admitting: Neurology

## 2022-07-29 ENCOUNTER — Telehealth: Payer: Self-pay

## 2022-07-29 ENCOUNTER — Other Ambulatory Visit: Payer: Self-pay | Admitting: Neurology

## 2022-07-29 DIAGNOSIS — R519 Headache, unspecified: Secondary | ICD-10-CM

## 2022-07-29 DIAGNOSIS — G43009 Migraine without aura, not intractable, without status migrainosus: Secondary | ICD-10-CM

## 2022-07-29 MED ORDER — NURTEC 75 MG PO TBDP: 75.0000 mg | ORAL_TABLET | Freq: Every day | ORAL | 11 refills | Status: AC | PRN

## 2022-08-04 ENCOUNTER — Telehealth: Payer: Self-pay

## 2022-08-04 ENCOUNTER — Encounter: Payer: Self-pay | Admitting: Neurology

## 2022-08-04 ENCOUNTER — Other Ambulatory Visit: Payer: Self-pay | Admitting: Neurology

## 2022-08-04 DIAGNOSIS — G43009 Migraine without aura, not intractable, without status migrainosus: Secondary | ICD-10-CM

## 2022-08-04 MED ORDER — NORTRIPTYLINE HCL 50 MG PO CAPS: 100.0000 mg | ORAL_CAPSULE | Freq: Every day | ORAL | 11 refills | Status: DC

## 2022-08-04 NOTE — Telephone Encounter (Signed)
Called Pt and made him aware of Dr. Karren Burly answers. He understood.

## 2022-08-19 ENCOUNTER — Encounter (HOSPITAL_COMMUNITY)
Admission: RE | Admit: 2022-08-19 | Discharge: 2022-08-19 | Disposition: A | Discharge status: Home / Self Care | Payer: Medicare Other | Source: Ambulatory Visit | Attending: Urology | Admitting: Urology

## 2022-08-19 ENCOUNTER — Encounter (HOSPITAL_COMMUNITY): Payer: Self-pay

## 2022-08-19 DIAGNOSIS — Z01818 Encounter for other preprocedural examination: Secondary | ICD-10-CM | POA: Insufficient documentation

## 2022-08-19 NOTE — Patient Instructions (Signed)
Dustin Walker  08/19/2022     @PREFPERIOPPHARMACY @   Your procedure is scheduled on 08/21/2022.  Report to [name] at 9:30 A.M.  Call this number if you have problems the morning of surgery:  [phone]  If you experience any cold or flu symptoms such as cough, fever, chills, shortness of breath, etc. between now and your scheduled surgery, please notify us at the above number.   Remember:  Do not eat or drink after midnight.      Take these medicines the morning of surgery with A SIP OF WATER : Nurtec Oxycodone Pantoprazole and Topamax    Do not wear jewelry, make-up or nail polish.  Do not wear lotions, powders, or perfumes, or deodorant.  Do not shave 48 hours prior to surgery.  Men may shave face and neck.  Do not bring valuables to the hospital.  [facility] is not responsible for any belongings or valuables.  Contacts, dentures or bridgework may not be worn into surgery.  Leave your suitcase in the car.  After surgery it may be brought to your room.  For patients admitted to the hospital, discharge time will be determined by your treatment team.  Patients discharged the day of surgery will not be allowed to drive home.   Name and phone number of your driver:   Family Special instructions:  N/A  Please read over the following fact sheets that you were given. Care and Recovery After Surgery   Transurethral Resection of Bladder Tumor  Transurethral resection of a bladder tumor is the removal (resection) of cancerous tissue (tumor) from the inside wall of the bladder. The bladder is the organ that holds urine. The tumor is removed through the tube that carries urine out of the body (urethra). In a transurethral resection, a thin telescope with a light, a tiny camera, and an electric cutting edge (resectoscope) is passed through the urethra. In men, the opening of the urethra is at the end of the penis. In women, it is just above the opening of the vagina. Tell a  health care provider about: Any allergies you have. All medicines you are taking, including vitamins, herbs, eye drops, creams, and over-the-counter medicines. Any problems you or family members have had with anesthetic medicines. Any bleeding problems you have. Any surgeries you have had. Any medical conditions you have, including recent urinary tract infections. Whether you are pregnant or may be pregnant. What are the risks? Generally, this is a safe procedure. However, problems may occur, including: Infection. Bleeding. Allergic reactions to medicines. Damage to nearby structures or organs. Difficulty urinating from blockage of the urethra or not being able to urinate (urinary retention). Deep vein thrombosis. This is a blood clot that can develop in your leg. Recurring cancer. What happens before the procedure? When to stop eating and drinking Follow instructions from your health care provider about what you may eat and drink before your procedure. These may include: 8 hours before your procedure Stop eating most foods. Do not eat meat, fried foods, or fatty foods. Eat only light foods, such as toast or crackers. All liquids are okay except energy drinks and alcohol. 6 hours before your procedure Stop eating. Drink only clear liquids, such as water, clear fruit juice, black coffee, plain tea, and sports drinks. Do not drink energy drinks or alcohol. 2 hours before your procedure Stop drinking all liquids. You may be allowed to take medicines with small sips of water. Medicines Ask your health  care provider about: Changing or stopping your regular medicines. This is especially important if you are taking diabetes medicines or blood thinners. Taking medicines such as aspirin and ibuprofen. These medicines can thin your blood. Do not take these medicines unless your health care provider tells you to take them. Taking over-the-counter medicines, vitamins, herbs, and  supplements. General instructions If you will be going home right after the procedure, plan to have a responsible adult: Take you home from the hospital or clinic. You will not be allowed to drive. Care for you for the time you are told. Ask your health care provider what steps will be taken to help prevent infection. These steps may include: Washing skin with a germ-killing soap. Taking antibiotic medicine. Do not use any products that contain nicotine or tobacco for at least 4 weeks before the procedure. These products include cigarettes, chewing tobacco, and vaping devices, such as e-cigarettes. If you need help quitting, ask your health care provider. What happens during the procedure? An IV will be inserted into one of your veins. You will be given one or more of the following: A medicine to help you relax (sedative). A medicine that is injected into your spine to numb the area below and slightly above the injection site (spinal anesthetic). A medicine that is injected into an area of your body to numb everything below the injection site (regional anesthetic). A medicine to make you fall asleep (general anesthetic). Your legs will be placed in foot rests (stirrups) to open your legs and bend your knees. The resectoscope will be passed through your urethra and into your bladder. The part of your bladder with the tumor will be resected by the cutting edge of the resectoscope. Fluid will be passed to rinse out the cut tissues (irrigation). The resectoscope will then be taken out. A small, thin tube (catheter) will be passed through your urethra and into your bladder. The catheter will drain urine into a bag outside of your body. The procedure may vary among health care providers and hospitals. What happens after the procedure? Your blood pressure, heart rate, breathing rate, and blood oxygen level will be monitored until you leave the hospital or clinic. You may continue to receive fluids  and medicines through an IV. You will be given pain medicine to relieve pain. You will have a catheter to drain your urine. The amount of urine will be measured. If you have blood in your urine, your bladder may be rinsed out by passing fluid through your catheter. You will be encouraged to walk as soon as you can. You may have to wear compression stockings. These stockings help to prevent blood clots and reduce swelling in your legs. If you were given a sedative during the procedure, it can affect you for several hours. Do not drive or operate machinery until your health care provider says that it is safe. Summary Transurethral resection of a bladder tumor is the removal (resection) of a cancerous growth (tumor) on the inside wall of the bladder. To do this procedure, your health care provider uses a thin telescope with a light, a tiny camera, and an electric cutting edge (resectoscope) that is guided to your bladder through your urethra. The part of your bladder that is affected by the tumor will be resected by the cutting edge of the resectoscope. A catheter will be passed through your urethra and into your bladder. The catheter will drain urine into a bag outside of your body. If you will  be going home right after the procedure, plan to have a responsible adult take you home from the hospital or clinic. You will not be allowed to drive. This information is not intended to replace advice given to you by your health care provider. Make sure you discuss any questions you have with your health care provider. Document Revised: 05/03/2021 Document Reviewed: 05/03/2021 Elsevier Patient Education  2023 [employer]  General Anesthesia, Adult General anesthesia is the use of medicine to make you fall asleep (unconscious) for a medical procedure. General anesthesia must be used for certain procedures. It is often recommended for surgery or procedures that: Last a long time. Require you to be still or  in an unusual position. Are major and can cause blood loss. Affect your breathing. The medicines used for general anesthesia are called general anesthetics. During general anesthesia, these medicines are given along with medicines that: Prevent pain. Control your blood pressure. Relax your muscles. Prevent nausea and vomiting after the procedure. Tell a health care provider about: Any allergies you have. All medicines you are taking, including vitamins, herbs, eye drops, creams, and over-the-counter medicines. Your history of any: Medical conditions you have, including: High blood pressure. Bleeding problems. Diabetes. Heart or lung conditions, such as: Heart failure. Sleep apnea. Asthma. Chronic obstructive pulmonary disease (COPD). Current or recent illnesses, such as: Upper respiratory, chest, or ear infections. Cough or fever. Tobacco or drug use, including marijuana or alcohol use. Depression or anxiety. Surgeries and types of anesthetics you have had. Problems you or family members have had with anesthetic medicines. Whether you are pregnant or may be pregnant. Whether you have any chipped or loose teeth, dentures, caps, bridgework, or issues with your mouth, swallowing, or choking. What are the risks? Your health care provider will talk with you about risks. These may include: Allergic reaction to the medicines. Lung and heart problems. Inhaling food or liquid from the stomach into the lungs (aspiration). Nerve injury. Injury to the lips, mouth, teeth, or gums. Stroke. Waking up during your procedure and being unable to move. This is rare. These problems are more likely to develop if you are having a major surgery or if you have an advanced or serious medical condition. You can prevent some of these complications by answering all of your health care provider's questions thoroughly and by following all instructions before your procedure. General anesthesia can cause side  effects, including: Nausea or vomiting. A sore throat or hoarseness from the breathing tube. Wheezing or coughing. Shaking chills or feeling cold. Body aches. Sleepiness. Confusion, agitation (delirium), or anxiety. What happens before the procedure? When to stop eating and drinking Follow instructions from your health care provider about what you may eat and drink before your procedure. If you do not follow your health care provider's instructions, your procedure may be delayed or canceled. Medicines Ask your health care provider about: Changing or stopping your regular medicines. These include any diabetes medicines or blood thinners you take. Taking medicines such as aspirin and ibuprofen. These medicines can thin your blood. Do not take them unless your health care provider tells you to. Taking over-the-counter medicines, vitamins, herbs, and supplements. General instructions Do not use any products that contain nicotine or tobacco for at least 4 weeks before the procedure. These products include cigarettes, chewing tobacco, and vaping devices, such as e-cigarettes. If you need help quitting, ask your health care provider. If you brush your teeth on the morning of the procedure, make sure to spit  out all of the water and toothpaste. If told by your health care provider, bring your sleep apnea device with you to surgery (if applicable). If you will be going home right after the procedure, plan to have a responsible adult: Take you home from the hospital or clinic. You will not be allowed to drive. Care for you for the time you are told. What happens during the procedure?  An IV will be inserted into one of your veins. You will be given one or more of the following through a face mask or IV: A sedative. This helps you relax. Anesthesia. This will: Numb certain areas of your body. Make you fall asleep for surgery. After you are unconscious, a breathing tube may be inserted down your  throat to help you breathe. This will be removed before you wake up. An anesthesia provider, such as an anesthesiologist, will stay with you throughout your procedure. The anesthesia provider will: Keep you comfortable and safe by continuing to give you medicines and adjusting the amount of medicine that you get. Monitor your blood pressure, heart rate, and oxygen levels to make sure that the anesthetics do not cause any problems. The procedure may vary among health care providers and hospitals. What happens after the procedure? Your blood pressure, temperature, heart rate, breathing rate, and blood oxygen level will be monitored until you leave the hospital or clinic. You will wake up in a recovery area. You may wake up slowly. You may be given medicine to help you with pain, nausea, or any other side effects from the anesthesia. Summary General anesthesia is the use of medicine to make you fall asleep (unconscious) for a medical procedure. Follow your health care provider's instructions about when to stop eating, drinking, or taking certain medicines before your procedure. Plan to have a responsible adult take you home from the hospital or clinic. This information is not intended to replace advice given to you by your health care provider. Make sure you discuss any questions you have with your health care provider. Document Revised: 07/25/2021 Document Reviewed: 07/25/2021 Elsevier Patient Education  2023 [employer]  How to Use Chlorhexidine Before Surgery Chlorhexidine gluconate (CHG) is a germ-killing (antiseptic) solution that is used to clean the skin. It can get rid of the bacteria that normally live on the skin and can keep them away for about 24 hours. To clean your skin with CHG, you may be given: A CHG solution to use in the shower or as part of a sponge bath. A prepackaged cloth that contains CHG. Cleaning your skin with CHG may help lower the risk for infection: While you are  staying in the intensive care unit of the hospital. If you have a vascular access, such as a central line, to provide short-term or long-term access to your veins. If you have a catheter to drain urine from your bladder. If you are on a ventilator. A ventilator is a machine that helps you breathe by moving air in and out of your lungs. After surgery. What are the risks? Risks of using CHG include: A skin reaction. Hearing loss, if CHG gets in your ears and you have a perforated eardrum. Eye injury, if CHG gets in your eyes and is not rinsed out. The CHG product catching fire. Make sure that you avoid smoking and flames after applying CHG to your skin. Do not use CHG: If you have a chlorhexidine allergy or have previously reacted to chlorhexidine. On babies younger than 2  months of age. How to use CHG solution Use CHG only as told by your health care provider, and follow the instructions on the label. Use the full amount of CHG as directed. Usually, this is one bottle. During a shower Follow these steps when using CHG solution during a shower (unless your health care provider gives you different instructions): Start the shower. Use your normal soap and shampoo to wash your face and hair. Turn off the shower or move out of the shower stream. Pour the CHG onto a clean washcloth. Do not use any type of brush or rough-edged sponge. Starting at your neck, lather your body down to your toes. Make sure you follow these instructions: If you will be having surgery, pay special attention to the part of your body where you will be having surgery. Scrub this area for at least 1 minute. Do not use CHG on your head or face. If the solution gets into your ears or eyes, rinse them well with water. Avoid your genital area. Avoid any areas of skin that have broken skin, cuts, or scrapes. Scrub your back and under your arms. Make sure to wash skin folds. Let the lather sit on your skin for 1-2 minutes or  as long as told by your health care provider. Thoroughly rinse your entire body in the shower. Make sure that all body creases and crevices are rinsed well. Dry off with a clean towel. Do not put any substances on your body afterward--such as powder, lotion, or perfume--unless you are told to do so by your health care provider. Only use lotions that are recommended by the manufacturer. Put on clean clothes or pajamas. If it is the night before your surgery, sleep in clean sheets.  During a sponge bath Follow these steps when using CHG solution during a sponge bath (unless your health care provider gives you different instructions): Use your normal soap and shampoo to wash your face and hair. Pour the CHG onto a clean washcloth. Starting at your neck, lather your body down to your toes. Make sure you follow these instructions: If you will be having surgery, pay special attention to the part of your body where you will be having surgery. Scrub this area for at least 1 minute. Do not use CHG on your head or face. If the solution gets into your ears or eyes, rinse them well with water. Avoid your genital area. Avoid any areas of skin that have broken skin, cuts, or scrapes. Scrub your back and under your arms. Make sure to wash skin folds. Let the lather sit on your skin for 1-2 minutes or as long as told by your health care provider. Using a different clean, wet washcloth, thoroughly rinse your entire body. Make sure that all body creases and crevices are rinsed well. Dry off with a clean towel. Do not put any substances on your body afterward--such as powder, lotion, or perfume--unless you are told to do so by your health care provider. Only use lotions that are recommended by the manufacturer. Put on clean clothes or pajamas. If it is the night before your surgery, sleep in clean sheets. How to use CHG prepackaged cloths Only use CHG cloths as told by your health care provider, and follow the  instructions on the label. Use the CHG cloth on clean, dry skin. Do not use the CHG cloth on your head or face unless your health care provider tells you to. When washing with the CHG cloth: Avoid  your genital area. Avoid any areas of skin that have broken skin, cuts, or scrapes. Before surgery Follow these steps when using a CHG cloth to clean before surgery (unless your health care provider gives you different instructions): Using the CHG cloth, vigorously scrub the part of your body where you will be having surgery. Scrub using a back-and-forth motion for 3 minutes. The area on your body should be completely wet with CHG when you are done scrubbing. Do not rinse. Discard the cloth and let the area air-dry. Do not put any substances on the area afterward, such as powder, lotion, or perfume. Put on clean clothes or pajamas. If it is the night before your surgery, sleep in clean sheets.  For general bathing Follow these steps when using CHG cloths for general bathing (unless your health care provider gives you different instructions). Use a separate CHG cloth for each area of your body. Make sure you wash between any folds of skin and between your fingers and toes. Wash your body in the following order, switching to a new cloth after each step: The front of your neck, shoulders, and chest. Both of your arms, under your arms, and your hands. Your stomach and groin area, avoiding the genitals. Your right leg and foot. Your left leg and foot. The back of your neck, your back, and your buttocks. Do not rinse. Discard the cloth and let the area air-dry. Do not put any substances on your body afterward--such as powder, lotion, or perfume--unless you are told to do so by your health care provider. Only use lotions that are recommended by the manufacturer. Put on clean clothes or pajamas. Contact a health care provider if: Your skin gets irritated after scrubbing. You have questions about using  your solution or cloth. You swallow any chlorhexidine. Call your local poison control center ([phone] in the [place] Get help right away if: Your eyes itch badly, or they become very red or swollen. Your skin itches badly and is red or swollen. Your hearing changes. You have trouble seeing. You have swelling or tingling in your mouth or throat. You have trouble breathing. These symptoms may represent a serious problem that is an emergency. Do not wait to see if the symptoms will go away. Get medical help right away. Call your local emergency services (911 in the [place] Do not drive yourself to the hospital. Summary Chlorhexidine gluconate (CHG) is a germ-killing (antiseptic) solution that is used to clean the skin. Cleaning your skin with CHG may help to lower your risk for infection. You may be given CHG to use for bathing. It may be in a bottle or in a prepackaged cloth to use on your skin. Carefully follow your health care provider's instructions and the instructions on the product label. Do not use CHG if you have a chlorhexidine allergy. Contact your health care provider if your skin gets irritated after scrubbing. This information is not intended to replace advice given to you by your health care provider. Make sure you discuss any questions you have with your health care provider. Document Revised: 08/26/2021 Document Reviewed: 07/09/2020 Elsevier Patient Education  2023 [employer].

## 2022-08-20 MED ORDER — GEMCITABINE CHEMO FOR BLADDER INSTILLATION 2000 MG
2000.0000 mg | Freq: Once | INTRAVENOUS | Status: DC
  Filled 2022-08-20: qty 52.6

## 2022-08-21 ENCOUNTER — Encounter (HOSPITAL_COMMUNITY)
Admission: RE | Disposition: A | Discharge status: Home / Self Care | Payer: Self-pay | Source: Home / Self Care | Attending: Urology

## 2022-08-21 ENCOUNTER — Ambulatory Visit (HOSPITAL_BASED_OUTPATIENT_CLINIC_OR_DEPARTMENT_OTHER): Payer: Medicare Other | Admitting: Anesthesiology

## 2022-08-21 ENCOUNTER — Encounter (HOSPITAL_COMMUNITY): Payer: Self-pay | Admitting: Urology

## 2022-08-21 ENCOUNTER — Ambulatory Visit (HOSPITAL_COMMUNITY): Payer: Medicare Other | Admitting: Anesthesiology

## 2022-08-21 ENCOUNTER — Ambulatory Visit (HOSPITAL_COMMUNITY)
Admission: RE | Admit: 2022-08-21 | Discharge: 2022-08-21 | Disposition: A | Discharge status: Home / Self Care | Payer: Medicare Other | Attending: Urology | Admitting: Urology

## 2022-08-21 DIAGNOSIS — J449 Chronic obstructive pulmonary disease, unspecified: Secondary | ICD-10-CM | POA: Insufficient documentation

## 2022-08-21 DIAGNOSIS — N289 Disorder of kidney and ureter, unspecified: Secondary | ICD-10-CM

## 2022-08-21 DIAGNOSIS — F17210 Nicotine dependence, cigarettes, uncomplicated: Secondary | ICD-10-CM | POA: Diagnosis not present

## 2022-08-21 DIAGNOSIS — D4959 Neoplasm of unspecified behavior of other genitourinary organ: Secondary | ICD-10-CM

## 2022-08-21 DIAGNOSIS — D494 Neoplasm of unspecified behavior of bladder: Secondary | ICD-10-CM | POA: Diagnosis not present

## 2022-08-21 DIAGNOSIS — D303 Benign neoplasm of bladder: Secondary | ICD-10-CM | POA: Diagnosis not present

## 2022-08-21 DIAGNOSIS — C680 Malignant neoplasm of urethra: Secondary | ICD-10-CM | POA: Insufficient documentation

## 2022-08-21 HISTORY — PX: CYSTOSCOPY: SHX5120

## 2022-08-21 HISTORY — PX: BLADDER INSTILLATION: SHX6893

## 2022-08-21 HISTORY — PX: TRANSURETHRAL RESECTION OF BLADDER TUMOR: SHX2575

## 2022-08-21 SURGERY — CYSTOSCOPY
Anesthesia: General

## 2022-08-21 MED ORDER — ONDANSETRON HCL 4 MG/2ML IJ SOLN: 4.0000 mg | Freq: Once | INTRAMUSCULAR | Status: DC | PRN

## 2022-08-21 MED ORDER — LACTATED RINGERS IV SOLN: INTRAVENOUS | Status: DC | PRN

## 2022-08-21 MED ORDER — OXYCODONE HCL 5 MG PO TABS: 5.0000 mg | ORAL_TABLET | Freq: Once | ORAL | Status: DC | PRN

## 2022-08-21 MED ORDER — ORAL CARE MOUTH RINSE: 15.0000 mL | Freq: Once | OROMUCOSAL | Status: DC

## 2022-08-21 MED ORDER — OXYCODONE HCL 5 MG/5ML PO SOLN: 5.0000 mg | Freq: Once | ORAL | Status: DC | PRN

## 2022-08-21 MED ORDER — SUGAMMADEX SODIUM 500 MG/5ML IV SOLN
INTRAVENOUS | Status: DC | PRN
  Administered 2022-08-21: 200 mg via INTRAVENOUS

## 2022-08-21 MED ORDER — EPHEDRINE SULFATE (PRESSORS) 50 MG/ML IJ SOLN
INTRAMUSCULAR | Status: DC | PRN
  Administered 2022-08-21 (×2): 5 mg via INTRAVENOUS

## 2022-08-21 MED ORDER — CEFAZOLIN SODIUM-DEXTROSE 2-4 GM/100ML-% IV SOLN
2.0000 g | INTRAVENOUS | Status: AC
  Administered 2022-08-21: 2 g via INTRAVENOUS

## 2022-08-21 MED ORDER — FENTANYL CITRATE (PF) 250 MCG/5ML IJ SOLN
INTRAMUSCULAR | Status: AC
  Filled 2022-08-21: qty 5

## 2022-08-21 MED ORDER — FENTANYL CITRATE PF 50 MCG/ML IJ SOSY: 25.0000 ug | PREFILLED_SYRINGE | INTRAMUSCULAR | Status: DC | PRN

## 2022-08-21 MED ORDER — VASOPRESSIN 20 UNIT/ML IV SOLN
INTRAVENOUS | Status: DC | PRN
  Administered 2022-08-21 (×2): 1 [IU] via INTRAVENOUS

## 2022-08-21 MED ORDER — GEMCITABINE CHEMO FOR BLADDER INSTILLATION 2000 MG
INTRAVENOUS | Status: DC | PRN
  Administered 2022-08-21: 2000 mg via INTRAVESICAL

## 2022-08-21 MED ORDER — STERILE WATER FOR IRRIGATION IR SOLN
Status: DC | PRN
  Administered 2022-08-21: 500 mL

## 2022-08-21 MED ORDER — CHLORHEXIDINE GLUCONATE 0.12 % MT SOLN
OROMUCOSAL | Status: AC
  Filled 2022-08-21: qty 15

## 2022-08-21 MED ORDER — ROCURONIUM BROMIDE 100 MG/10ML IV SOLN
INTRAVENOUS | Status: DC | PRN
  Administered 2022-08-21: 50 mg via INTRAVENOUS

## 2022-08-21 MED ORDER — GLYCOPYRROLATE PF 0.2 MG/ML IJ SOSY
PREFILLED_SYRINGE | INTRAMUSCULAR | Status: AC
  Filled 2022-08-21: qty 1

## 2022-08-21 MED ORDER — ONDANSETRON HCL 4 MG/2ML IJ SOLN
INTRAMUSCULAR | Status: DC | PRN
  Administered 2022-08-21: 4 mg via INTRAVENOUS

## 2022-08-21 MED ORDER — CEFAZOLIN SODIUM-DEXTROSE 2-4 GM/100ML-% IV SOLN
INTRAVENOUS | Status: AC
  Filled 2022-08-21: qty 100

## 2022-08-21 MED ORDER — LACTATED RINGERS IV SOLN: INTRAVENOUS | Status: DC

## 2022-08-21 MED ORDER — PROPOFOL 10 MG/ML IV BOLUS
INTRAVENOUS | Status: DC | PRN
  Administered 2022-08-21: 130 mg via INTRAVENOUS

## 2022-08-21 MED ORDER — CHLORHEXIDINE GLUCONATE 0.12 % MT SOLN: 15.0000 mL | Freq: Once | OROMUCOSAL | Status: DC

## 2022-08-21 MED ORDER — GLYCOPYRROLATE PF 0.2 MG/ML IJ SOSY
PREFILLED_SYRINGE | INTRAMUSCULAR | Status: DC | PRN
  Administered 2022-08-21: .2 mg via INTRAVENOUS

## 2022-08-21 MED ORDER — FENTANYL CITRATE (PF) 100 MCG/2ML IJ SOLN
INTRAMUSCULAR | Status: DC | PRN
  Administered 2022-08-21 (×2): 50 ug via INTRAVENOUS

## 2022-08-21 MED ORDER — SODIUM CHLORIDE 0.9 % IR SOLN
Status: DC | PRN
  Administered 2022-08-21: 3000 mL

## 2022-08-21 MED ORDER — EPHEDRINE 5 MG/ML INJ
INTRAVENOUS | Status: AC
  Filled 2022-08-21: qty 5

## 2022-08-21 MED ORDER — MIDAZOLAM HCL 2 MG/2ML IJ SOLN
INTRAMUSCULAR | Status: AC
  Filled 2022-08-21: qty 2

## 2022-08-21 MED ORDER — PHENYLEPHRINE HCL (PRESSORS) 10 MG/ML IV SOLN
INTRAVENOUS | Status: DC | PRN
  Administered 2022-08-21 (×4): 160 ug via INTRAVENOUS

## 2022-08-21 MED ORDER — MIDAZOLAM HCL 5 MG/5ML IJ SOLN
INTRAMUSCULAR | Status: DC | PRN
  Administered 2022-08-21: 2 mg via INTRAVENOUS

## 2022-08-21 MED ORDER — OXYCODONE-ACETAMINOPHEN 7.5-325 MG PO TABS: 1.0000 | ORAL_TABLET | ORAL | 0 refills | Status: DC | PRN

## 2022-08-21 MED ORDER — PHENYLEPHRINE 80 MCG/ML (10ML) SYRINGE FOR IV PUSH (FOR BLOOD PRESSURE SUPPORT)
PREFILLED_SYRINGE | INTRAVENOUS | Status: AC
  Filled 2022-08-21: qty 10

## 2022-08-21 SURGICAL SUPPLY — 28 items
BAG DRAIN URO TABLE W/ADPT NS (BAG) ×2 IMPLANT
BAG DRN 8 ADPR NS SKTRN CSTL (BAG) ×1
BAG DRN RND TRDRP ANRFLXCHMBR (UROLOGICAL SUPPLIES) ×1
BAG HAMPER (MISCELLANEOUS) ×2 IMPLANT
BAG URINE DRAIN 2000ML AR STRL (UROLOGICAL SUPPLIES) ×2 IMPLANT
CATH FOLEY 2WAY SLVR  5CC 18FR (CATHETERS) ×1
CATH FOLEY 2WAY SLVR 5CC 18FR (CATHETERS) IMPLANT
CLOTH BEACON ORANGE TIMEOUT ST (SAFETY) ×2 IMPLANT
ELECT LOOP 22F BIPOLAR SML (ELECTROSURGICAL) ×1
ELECTRODE LOOP 22F BIPOLAR SML (ELECTROSURGICAL) ×2 IMPLANT
GLOVE BIO SURGEON STRL SZ8 (GLOVE) ×2 IMPLANT
GLOVE BIOGEL PI IND STRL 7.0 (GLOVE) ×4 IMPLANT
GLOVE ECLIPSE 6.5 STRL STRAW (GLOVE) IMPLANT
GOWN STRL REUS W/TWL LRG LVL3 (GOWN DISPOSABLE) ×4 IMPLANT
GOWN STRL REUS W/TWL XL LVL3 (GOWN DISPOSABLE) ×2 IMPLANT
IV NS IRRIG 3000ML ARTHROMATIC (IV SOLUTION) ×4 IMPLANT
KIT CHEMO SPILL (MISCELLANEOUS) IMPLANT
KIT TURNOVER CYSTO (KITS) ×2 IMPLANT
MANIFOLD NEPTUNE II (INSTRUMENTS) ×2 IMPLANT
PACK CYSTO (CUSTOM PROCEDURE TRAY) ×2 IMPLANT
PAD ARMBOARD 7.5X6 YLW CONV (MISCELLANEOUS) ×2 IMPLANT
PLUG CATH AND CAP STER (CATHETERS) IMPLANT
SYR 30ML LL (SYRINGE) ×2 IMPLANT
SYR TOOMEY IRRIG 70ML (MISCELLANEOUS) ×1
SYRINGE TOOMEY IRRIG 70ML (MISCELLANEOUS) ×2 IMPLANT
TOWEL NATURAL 4PK STERILE (DISPOSABLE) ×2 IMPLANT
TOWEL OR 17X26 4PK STRL BLUE (TOWEL DISPOSABLE) ×2 IMPLANT
WATER STERILE IRR 500ML POUR (IV SOLUTION) ×2 IMPLANT

## 2022-08-21 NOTE — Transfer of Care (Signed)
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

## 2022-08-21 NOTE — Progress Notes (Signed)
Pt came from OR with urine bag hooked to catheter with gemcibine draining into bag. Dr.McKenzie notified.

## 2022-08-21 NOTE — Anesthesia Procedure Notes (Signed)
Procedure Name: Intubation Date/Time: 08/21/2022 11:57 AM  Performed by: Shanon Payor, CRNAPre-anesthesia Checklist: Patient identified, Emergency Drugs available, Suction available and Patient being monitored Patient Re-evaluated:Patient Re-evaluated prior to induction Oxygen Delivery Method: Circle system utilized Preoxygenation: Pre-oxygenation with 100% oxygen Induction Type: IV induction Ventilation: Mask ventilation without difficulty and Oral airway inserted - appropriate to patient size Laryngoscope Size: Hyacinth Meeker and 3 Grade View: Grade I Tube type: Oral Tube size: 7.5 mm Number of attempts: 1 Airway Equipment and Method: Stylet and Oral airway Placement Confirmation: ETT inserted through vocal cords under direct vision, positive ETCO2 and breath sounds checked- equal and bilateral Secured at: 24 cm Tube secured with: Tape Dental Injury: Teeth and Oropharynx as per pre-operative assessment

## 2022-08-21 NOTE — H&P (Signed)
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 He developed a recurrence at the site of his right ureteral orifice. He presents today for bladder tumor removal  Past Medical History:  Diagnosis Date   Allergic rhinitis    Arthritis    BCC (basal cell carcinoma) 04/02/2015   left upper eyelid, 06/12/15 MOHs   Chronic pain    left hand, crush injury in 2013   COPD (chronic obstructive pulmonary disease)    GERD (gastroesophageal reflux disease)    Headache    migraines   SCC (squamous cell carcinoma) 10/29/2015   well diff, upper mid bac (CX3, 5FU)   Past Surgical History:  Procedure Laterality Date   COLONOSCOPY  2010   Dr. [name]: tubular adenoma removed. was due for surveillance in 2015.   COLONOSCOPY WITH PROPOFOL N/A 02/22/2018   Procedure: COLONOSCOPY WITH PROPOFOL;  Surgeon: Corbin Ade, MD;  [facility];  Service: Endoscopy;  Laterality: N/A;  12:00pm   CYSTECTOMY N/A 03/31/2022   Procedure: CYSTECTOMY PARTIAL;  Surgeon: [name], MD;  Location: [facility];  Service: Urology;  Laterality: N/A;   CYSTOSCOPY Right 12/06/2021   Procedure: CYSTOSCOPY;  Surgeon: [name], MD;  [facility]: [facility];  Service: Urology;  Laterality: Right;   CYSTOSCOPY N/A 03/31/2022   Procedure: CYSTOSCOPY;  Surgeon: [name], MD;  [facility]: [facility];  Service: Urology;  Laterality: N/A;   CYSTOSCOPY WITH RETROGRADE PYELOGRAM, URETEROSCOPY AND STENT PLACEMENT Right 02/24/2022   Procedure: CYSTOSCOPY WITH RETROGRADE PYELOGRAM,   AND STENT PLACEMENT;  Surgeon: [name], MD;  [facility]: [facility];  Service: Urology;  Laterality: Right;   HAND SURGERY Right    middle finger   I & D EXTREMITY  06/19/2011   Procedure: IRRIGATION AND DEBRIDEMENT EXTREMITY;  Surgeon: [name], MD;  [facility];  Service: Plastics;;    IR NEPHROSTOGRAM RIGHT THRU EXISTING ACCESS  02/10/2022   IR NEPHROSTOMY PLACEMENT RIGHT  12/20/2021   IR NEPHROSTOMY PLACEMENT RIGHT  02/05/2022   JOINT REPLACEMENT     knee   KNEE SURGERY Bilateral    6 times per knee and total knee replacement on the left   ORIF ULNAR FRACTURE  06/19/2011   Procedure: OPEN REDUCTION INTERNAL FIXATION (ORIF) ULNAR FRACTURE;  Surgeon: [name], MD;  [facility];  Service: Plastics;  Laterality: Left;   ROBOT ASSISTED LAPAROSCOPIC NEPHRECTOMY Right 03/31/2022   Procedure: XI ROBOTIC ASSISTED LAPAROSCOPIC NEPHRECTOMY/URETERCTOMY;  Surgeon: [name], MD;  [facility];  Service: Urology;  Laterality: Right;   SHOULDER SURGERY Right    SPINAL CORD STIMULATOR BATTERY EXCHANGE Left 02/11/2021   Procedure: REPLACEMENT PULSE GENERATOR LEFT FLANK (MEDTRONIC);  Surgeon: [name], MD;  Location: [facility];  Service: Neurosurgery;  Laterality: Left;  LOCAL W/ MAC   TRANSURETHRAL RESECTION OF BLADDER TUMOR N/A 12/06/2021   Procedure: TRANSURETHRAL RESECTION OF BLADDER TUMOR (TURBT);  Surgeon: [name], MD;  [facility];  Service: Urology;  Laterality: N/A;   TRANSURETHRAL RESECTION OF BLADDER TUMOR N/A 03/31/2022   Procedure: TRANSURETHRAL RESECTION OF BLADDER TUMOR (TURBT);  Surgeon: [name], MD;  [facility];  Service: Urology;  Laterality: N/A;   URETERAL BIOPSY Right 02/24/2022   Procedure: URETERAL BIOPSY;  Surgeon: [name], MD;  [facility]: [facility];  Service: Urology;  Laterality: Right;  Home Medications:  Current Facility-Administered Medications  Medication Dose Route Frequency Provider Last Rate Last Admin   ceFAZolin (ANCEF) 2-4 GM/100ML-% IVPB            ceFAZolin (ANCEF) IVPB 2g/100 mL premix  2 g Intravenous 30 min Pre-Op Kalyan Barabas, [name], MD       chlorhexidine (PERIDEX) 0.12 % solution            gemcitabine (GEMZAR) chemo syringe for bladder instillation 2,000 mg  2,000 mg  Bladder Instillation Once Khrystyna Schwalm, [name], MD       Allergies:  Allergies  Allergen Reactions   Naproxen Sodium Rash and Other (See Comments)    "Burns from inside out" Aleve   Codeine Rash and Other (See Comments)    Burning    Family History  Problem Relation Age of Onset   Colon cancer Neg Hx    Social History:  reports that he has been smoking cigarettes. He started smoking about 59 years ago. He has a 49.00 pack-year smoking history. He has never used smokeless tobacco. He reports that he does not currently use alcohol. He reports that he does not use drugs.  Review of Systems  All other systems reviewed and are negative.   Physical Exam:  Vital signs in last 24 hours: Temp:  [97.1 F (36.2 C)] 97.1 F (36.2 C) (04/11 1027) Pulse Rate:  [63] 63 (04/11 1027) Resp:  [16] 16 (04/11 1027) BP: (118)/(74) 118/74 (04/11 1027) SpO2:  [100 %] 100 % (04/11 1027) Physical Exam Vitals reviewed.  Constitutional:      Appearance: Normal appearance.  HENT:     Head: Normocephalic and atraumatic.     Mouth/Throat:     Mouth: Mucous membranes are dry.  Eyes:     Extraocular Movements: Extraocular movements intact.     Pupils: Pupils are equal, round, and reactive to light.  Cardiovascular:     Rate and Rhythm: Normal rate and regular rhythm.  Pulmonary:     Effort: Pulmonary effort is normal. No respiratory distress.  Abdominal:     General: Abdomen is flat. There is no distension.  Musculoskeletal:        General: No swelling. Normal range of motion.     Cervical back: Normal range of motion and neck supple.  Skin:    General: Skin is warm and dry.  Neurological:     General: No focal deficit present.     Mental Status: He is alert and oriented to person, place, and time.  Psychiatric:        Mood and Affect: Mood normal.        Behavior: Behavior normal.        Thought Content: Thought content normal.        Judgment: Judgment normal.     Laboratory Data:   No results found for this or any previous visit (from the past 24 hour(s)). No results found for this or any previous visit (from the past 240 hour(s)). Creatinine: No results for input(s): "CREATININE" in the last 168 hours. Baseline Creatinine:   Impression/Assessment:  71yo with bladder tumor  Plan:  We discussed the management of bladder tumors inclduing transurethral resection. After discussing the procedure the patient wishes to proceed with surgery. Risks/benefits/alternatives discussed   Wilkie Aye 08/21/2022, 11:25 AM

## 2022-08-21 NOTE — Op Note (Signed)
.  Preoperative diagnosis: bladder tumor  Postoperative diagnosis: Same  Procedure: 1 cystoscopy 2. Transurethral resection of bladder tumor, small 3. Urethral biopsy  Attending: Cleda Mccreedy  Anesthesia: General  Estimated blood loss: Minimal  Drains: 18 French foley  Specimens: 1. Urethral tumor 2. Bladder tumor  Antibiotics: ancef  Findings: 1cm papillary at site of right ureteral orifice. 45mm papillary penile urethral tumor.  Indications: Patient is a 72 year old male with a history of bladder tumor found on office cystoscopy.  After discussing treatment options, they decided proceed with transurethral resection of a bladder tumor.  Procedure in detail: The patient was brought to the operating room and a brief timeout was done to ensure correct patient, correct procedure, correct site.  General anesthesia was administered patient was placed in dorsal lithotomy position.  Their genitalia was then prepped and draped in usual sterile fashion.  A rigid 22 French cystoscope was passed in the urethra and the bladder.  In the penile urethra we encountered a papillary tumor which was biopsied. Bladder was inspected and we noted a 1cm bladder tumor.  We then removed the cystoscope and placed a resectoscope into the bladder.  Using the bipolar resectoscope we removed the bladder tumor down to the base. A subsequent muscle deep biopsy was then taken. Hemostasis was then obtained with electrocautery. We then removed the bladder tumor chips and sent them for pathology. We then re-inspected the bladder and found no residula bleeding.  the bladder was then drained, a 18 French foley was placed and 2g of gemcitibine was instilled in the bladder. This then concluded the procedure which was well tolerated by patient.  Complications: None  Condition: Stable, extubated, transferred to PACU  Plan: Patient is to be discharged home and followup in 5 days for foley catheter removal and pathology  discussion. His foley is to be drained of the gemcibine in 1 hour

## 2022-08-21 NOTE — Anesthesia Preprocedure Evaluation (Signed)
Anesthesia Evaluation  Patient identified by MRN, date of birth, ID band Patient awake    Reviewed: Allergy & Precautions, H&P , NPO status , Patient's Chart, lab work & pertinent test results, reviewed documented beta blocker date and time   Airway Mallampati: II  TM Distance: >3 FB Neck ROM: full    Dental no notable dental hx. (+) Edentulous Upper, Edentulous Lower, Upper Dentures, Lower Dentures   Pulmonary neg pulmonary ROS, COPD, Current Smoker and Patient abstained from smoking.   Pulmonary exam normal breath sounds clear to auscultation       Cardiovascular Exercise Tolerance: Good negative cardio ROS  Rhythm:regular Rate:Normal     Neuro/Psych  Headaches negative neurological ROS  negative psych ROS   GI/Hepatic negative GI ROS, Neg liver ROS,GERD  ,,  Endo/Other  negative endocrine ROS    Renal/GU Renal diseasenegative Renal ROS  negative genitourinary   Musculoskeletal   Abdominal   Peds  Hematology negative hematology ROS (+)   Anesthesia Other Findings   Reproductive/Obstetrics negative OB ROS                             Anesthesia Physical Anesthesia Plan  ASA: 3  Anesthesia Plan: General and General LMA   Post-op Pain Management:    Induction:   PONV Risk Score and Plan: Ondansetron  Airway Management Planned:   Additional Equipment:   Intra-op Plan:   Post-operative Plan:   Informed Consent: I have reviewed the patients History and Physical, chart, labs and discussed the procedure including the risks, benefits and alternatives for the proposed anesthesia with the patient or authorized representative who has indicated his/her understanding and acceptance.     Dental Advisory Given  Plan Discussed with: CRNA  Anesthesia Plan Comments:        Anesthesia Quick Evaluation

## 2022-08-22 LAB — SURGICAL PATHOLOGY

## 2022-08-22 NOTE — Anesthesia Postprocedure Evaluation (Signed)
Anesthesia Post Note  Patient: Dustin Walker  Procedure(s) Performed: CYSTOSCOPY TRANSURETHRAL RESECTION OF BLADDER TUMOR (TURBT) BLADDER INSTILLATION  Patient location during evaluation: Phase II Anesthesia Type: General Level of consciousness: awake Pain management: pain level controlled Vital Signs Assessment: post-procedure vital signs reviewed and stable Respiratory status: spontaneous breathing and respiratory function stable Cardiovascular status: blood pressure returned to baseline and stable Postop Assessment: no headache and no apparent nausea or vomiting Anesthetic complications: no Comments: Late entry   No notable events documented.   Last Vitals:  Vitals:   08/21/22 1404 08/21/22 1408  BP: (!) 97/57 (!) 97/57  Pulse: (!) 54 (!) 54  Resp: 10 12  Temp:  36.4 C  SpO2: 100% 98%    Last Pain:  Vitals:   08/21/22 1408  TempSrc: Oral  PainSc: 0-No pain                 Windell Norfolk

## 2022-08-28 ENCOUNTER — Encounter (HOSPITAL_COMMUNITY): Payer: Self-pay | Admitting: Urology

## 2022-09-01 ENCOUNTER — Ambulatory Visit: Payer: Medicare Other | Admitting: Urology

## 2022-09-01 ENCOUNTER — Telehealth: Payer: Self-pay

## 2022-09-01 VITALS — BP 102/68 | HR 92

## 2022-09-01 DIAGNOSIS — C679 Malignant neoplasm of bladder, unspecified: Secondary | ICD-10-CM

## 2022-09-01 MED ORDER — CIPROFLOXACIN HCL 500 MG PO TABS
500.0000 mg | ORAL_TABLET | Freq: Once | ORAL | Status: AC
Start: 2022-09-01 — End: 2022-09-01
  Administered 2022-09-01: 500 mg via ORAL

## 2022-09-01 NOTE — Progress Notes (Unsigned)
09/01/2022 9:16 AM   Dustin Walker 22-Mar-1951 [id number]  Referring provider: Eliezer Lofts, MD [address] [place],  [place] [address]  No chief complaint on file.   HPI:    PMH: Past Medical History:  Diagnosis Date   Allergic rhinitis    Arthritis    BCC (basal cell carcinoma) 04/02/2015   left upper eyelid, 06/12/15 MOHs   Chronic pain    left hand, crush injury in 2013   COPD (chronic obstructive pulmonary disease)    GERD (gastroesophageal reflux disease)    Headache    migraines   SCC (squamous cell carcinoma) 10/29/2015   well diff, upper mid bac (CX3, 5FU)    Surgical History: Past Surgical History:  Procedure Laterality Date   BLADDER INSTILLATION N/A 08/21/2022   Procedure: BLADDER INSTILLATION;  Surgeon: [name], MD;  [facility];  Service: Urology;  Laterality: N/A;   COLONOSCOPY  2010   Dr. [name]: tubular adenoma removed. was due for surveillance in 2015.   COLONOSCOPY WITH PROPOFOL N/A 02/22/2018   Procedure: COLONOSCOPY WITH PROPOFOL;  Surgeon: Corbin Ade, MD;  [facility];  Service: Endoscopy;  Laterality: N/A;  12:00pm   CYSTECTOMY N/A 03/31/2022   Procedure: CYSTECTOMY PARTIAL;  Surgeon: [name], MD;  [facility];  Service: Urology;  Laterality: N/A;   CYSTOSCOPY Right 12/06/2021   Procedure: CYSTOSCOPY;  Surgeon: [name], MD;  [facility];  Service: Urology;  Laterality: Right;   CYSTOSCOPY N/A 03/31/2022   Procedure: CYSTOSCOPY;  Surgeon: [name], MD;  [facility];  Service: Urology;  Laterality: N/A;   CYSTOSCOPY N/A 08/21/2022   Procedure: CYSTOSCOPY;  Surgeon: [name], MD;  [facility];  Service: Urology;  Laterality: N/A;   CYSTOSCOPY WITH RETROGRADE PYELOGRAM, URETEROSCOPY AND STENT PLACEMENT Right 02/24/2022   Procedure: CYSTOSCOPY WITH RETROGRADE PYELOGRAM,   AND STENT PLACEMENT;  Surgeon: [name], MD;  [facility];  Service: Urology;  Laterality: Right;   HAND SURGERY Right    middle finger   I & D EXTREMITY  06/19/2011   Procedure: IRRIGATION AND DEBRIDEMENT EXTREMITY;  Surgeon: [name], MD;  [facility];  Service: Plastics;;   IR NEPHROSTOGRAM RIGHT THRU EXISTING ACCESS  02/10/2022   IR NEPHROSTOMY PLACEMENT RIGHT  12/20/2021   IR NEPHROSTOMY PLACEMENT RIGHT  02/05/2022   JOINT REPLACEMENT     knee   KNEE SURGERY Bilateral    6 times per knee and total knee replacement on the left   ORIF ULNAR FRACTURE  06/19/2011   Procedure: OPEN REDUCTION INTERNAL FIXATION (ORIF) ULNAR FRACTURE;  Surgeon: [name], MD;  [facility];  Service: Plastics;  Laterality: Left;   ROBOT ASSISTED LAPAROSCOPIC NEPHRECTOMY Right 03/31/2022   Procedure: XI ROBOTIC ASSISTED LAPAROSCOPIC NEPHRECTOMY/URETERCTOMY;  Surgeon: [name], MD;  [facility];  Service: Urology;  Laterality: Right;   SHOULDER SURGERY Right    SPINAL CORD STIMULATOR BATTERY EXCHANGE Left 02/11/2021   Procedure: REPLACEMENT PULSE GENERATOR LEFT FLANK (MEDTRONIC);  Surgeon: [name], MD;  Location: [facility];  Service: Neurosurgery;  Laterality: Left;  LOCAL W/ MAC   TRANSURETHRAL RESECTION OF BLADDER TUMOR N/A 12/06/2021   Procedure: TRANSURETHRAL RESECTION OF BLADDER TUMOR (TURBT);  Surgeon: [name], MD;  [facility];  Service: Urology;  Laterality: N/A;   TRANSURETHRAL RESECTION OF BLADDER TUMOR N/A 03/31/2022   Procedure: TRANSURETHRAL RESECTION OF BLADDER TUMOR (  TURBT);  Surgeon: [name], MD;  [facility];  Service: Urology;  Laterality: N/A;   TRANSURETHRAL RESECTION OF BLADDER TUMOR N/A 08/21/2022   Procedure: TRANSURETHRAL RESECTION OF BLADDER TUMOR (TURBT);  Surgeon: [name], MD;  [facility];  Service: Urology;  Laterality: N/A;   URETERAL BIOPSY Right 02/24/2022   Procedure: URETERAL BIOPSY;  Surgeon: [name], MD;  Location[facility];   Service: Urology;  Laterality: Right;    Home Medications:  Allergies as of 09/01/2022       Reactions   Naproxen Sodium Rash, Other (See Comments)   "Burns from inside out" Aleve   Codeine Rash, Other (See Comments)   Burning        Medication List        Accurate as of September 01, 2022  9:16 AM. If you have any questions, ask your nurse or doctor.          Magnesium 250 MG Tabs Take 500 mg by mouth daily.   multivitamin with minerals Tabs tablet Take 1 tablet by mouth daily.   nortriptyline 50 MG capsule Commonly known as: PAMELOR Take 2 capsules (100 mg total) by mouth at bedtime.   Nurtec 75 MG Tbdp Generic drug: Rimegepant Sulfate Take 1 tablet (75 mg total) by mouth daily as needed (Migraine).   oxyCODONE-acetaminophen 7.5-325 MG tablet Commonly known as: PERCOCET Take 1 tablet by mouth every 4 (four) hours as needed.   pantoprazole 40 MG tablet Commonly known as: PROTONIX Take 1 tablet (40 mg total) by mouth daily. What changed: when to take this   TheraTears Extra 0.25 % Soln Generic drug: Carboxymethylcellulose Sodium Place 1 drop into both eyes as needed (dry eyes).   topiramate 50 MG tablet Commonly known as: TOPAMAX Take 50 mg by mouth 2 (two) times daily.        Allergies:  Allergies  Allergen Reactions   Naproxen Sodium Rash and Other (See Comments)    "Burns from inside out" Aleve   Codeine Rash and Other (See Comments)    Burning    Family History: Family History  Problem Relation Age of Onset   Colon cancer Neg Hx     Social History:  reports that he has been smoking cigarettes. He started smoking about 60 years ago. He has a 49.00 pack-year smoking history. He has never used smokeless tobacco. He reports that he does not currently use alcohol. He reports that he does not use drugs.  ROS: All other review of systems were reviewed and are negative except what is noted above in HPI  Physical Exam: BP 102/68   Pulse 92    Constitutional:  Alert and oriented, No acute distress. HEENT: [place] AT, moist mucus membranes.  Trachea midline, no masses. Cardiovascular: No clubbing, cyanosis, or edema. Respiratory: Normal respiratory effort, no increased work of breathing. GI: Abdomen is soft, nontender, nondistended, no abdominal masses GU: No CVA tenderness.  Lymph: No cervical or inguinal lymphadenopathy. Skin: No rashes, bruises or suspicious lesions. Neurologic: Grossly intact, no focal deficits, moving all 4 extremities. Psychiatric: Normal mood and affect.  Laboratory Data: Lab Results  Component Value Date   WBC 8.6 04/01/2022   HGB 10.3 (L) 04/01/2022   HCT 32.8 (L) 04/01/2022   MCV 98.5 04/01/2022   PLT 147 (L) 04/01/2022    Lab Results  Component Value Date   CREATININE 1.84 (H) 07/14/2022    No results found for: "PSA"  No results found for: "  TESTOSTERONE"  No results found for: "HGBA1C"  Urinalysis    Component Value Date/Time   COLORURINE YELLOW 06/26/2010 1000   APPEARANCEUR Hazy (A) 02/12/2022 1519   LABSPEC 1.007 06/26/2010 1000   PHURINE 6.5 06/26/2010 1000   GLUCOSEU Negative 02/12/2022 1519   HGBUR NEGATIVE 06/26/2010 1000   BILIRUBINUR Negative 02/12/2022 1519   KETONESUR NEGATIVE 06/26/2010 1000   PROTEINUR 3+ (A) 02/12/2022 1519   PROTEINUR NEGATIVE 06/26/2010 1000   UROBILINOGEN 0.2 06/26/2010 1000   NITRITE Negative 02/12/2022 1519   NITRITE NEGATIVE 06/26/2010 1000   LEUKOCYTESUR 1+ (A) 02/12/2022 1519    Lab Results  Component Value Date   LABMICR See below: 02/12/2022   WBCUA 11-30 (A) 02/12/2022   LABEPIT 0-10 02/12/2022   MUCUS Present 01/15/2022   BACTERIA None seen 02/12/2022    Pertinent Imaging: *** Results for orders placed in visit on 10/18/21  Abdomen 1 view (KUB)  Narrative CLINICAL DATA:  Nephrolithiasis  EXAM: ABDOMEN - 1 VIEW  COMPARISON:  None Available.  FINDINGS: Nonobstructive bowel gas pattern. No free intraperitoneal gas.  Large colonic stool burden. No definite nephro or urolithiasis. Mild vascular calcification noted within the pelvis. No organomegaly. Osseous structures are unremarkable.  IMPRESSION: No definite nephro or urolithiasis.  Large colonic stool burden.   Electronically Signed By: Helyn Numbers M.D. On: 10/19/2021 21:30  No results found for this or any previous visit.  No results found for this or any previous visit.  No results found for this or any previous visit.  Results for orders placed during the hospital encounter of 11/27/21  Ultrasound renal complete  Narrative CLINICAL DATA:  hydronephrosis  EXAM: RENAL / URINARY TRACT ULTRASOUND COMPLETE  COMPARISON:  CT dated Sep 23, 2021; ultrasound in Sep 10, 2021  FINDINGS: Right Kidney:  Renal measurements: 8.9 x 5.2 x 4.6 cm = volume: 109 mL. Echogenicity within normal limits. Persistent severe hydronephrosis with mild cortical thinning. This persists postvoid. This is not significantly changed in comparison to prior ultrasound.  Left Kidney:  Renal measurements: 10.0 x 5.7 x 4.7 cm = volume: 141 mL. Echogenicity within normal limits. No mass or hydronephrosis visualized.  Bladder:  Decompressed and not visualized.  Other:  None.  IMPRESSION: Persistent severe RIGHT-sided hydronephrosis, similar in comparison to prior ultrasound and CT. This persists postvoid. There is mild cortical thinning.   Electronically Signed By: Meda Klinefelter M.D. On: 11/27/2021 17:18  No valid procedures specified. No results found for this or any previous visit.  Results for orders placed during the hospital encounter of 09/23/21  CT RENAL STONE STUDY  Narrative CLINICAL DATA:  Right flank pain. Right hydronephrosis. Chronic kidney disease.  EXAM: CT ABDOMEN AND PELVIS WITHOUT CONTRAST  TECHNIQUE: Multidetector CT imaging of the abdomen and pelvis was performed following the standard protocol without IV  contrast.  RADIATION DOSE REDUCTION: This exam was performed according to the departmental dose-optimization program which includes automated exposure control, adjustment of the mA and/or kV according to patient size and/or use of iterative reconstruction technique.  COMPARISON:  Ultrasound on 09/10/2021  FINDINGS: Lower chest: No acute findings. 6 mm pulmonary nodule in the lateral right lower lobe remains stable since earlier chest CT in 2020, consistent with benign etiology. Emphysema again noted in both lung bases.  Hepatobiliary: No mass visualized on this unenhanced exam. Gallbladder is unremarkable. No evidence of biliary ductal dilatation.  Pancreas: No mass or inflammatory process visualized on this unenhanced exam.  Spleen:  Within normal limits in  size.  Adrenals/Urinary tract: Moderate diffuse right renal atrophy is seen. Moderate right hydroureteronephrosis is seen, with a 2 mm calculus in the region of right UVJ. Another small calculus is seen in the bladder in the region of the left UVJ, however there is no evidence of left-sided hydroureteronephrosis.  Stomach/Bowel: No evidence of obstruction, inflammatory process, or abnormal fluid collections.  Vascular/Lymphatic: No pathologically enlarged lymph nodes identified. No evidence of abdominal aortic aneurysm. Aortic atherosclerotic calcification incidentally noted.  Reproductive:  Mildly enlarged prostate.  Other:  None.  Musculoskeletal:  No suspicious bone lesions identified.  IMPRESSION: Moderate right hydroureteronephrosis, with 2 mm calculus in bladder in region of right UVJ.  Moderate diffuse right renal atrophy.  Small bladder calculus in region of left UVJ, but no left-sided hydronephrosis  Mildly enlarged prostate.   Electronically Signed By: Danae Orleans M.D. On: 09/24/2021 20:30   Assessment & Plan:    1. Malignant neoplasm of urinary bladder, unspecified site *** - Bladder  Voiding Trial - ciprofloxacin (CIPRO) tablet 500 mg   No follow-ups on file.  Wilkie Aye, MD  [facility] [place]

## 2022-09-01 NOTE — Progress Notes (Signed)
Fill and Pull Catheter Removal  Patient is present today for a catheter removal.  Patient was cleaned and prepped in a sterile fashion 200 ml of sterile water/ saline was instilled into the bladder when the patient felt the urge to urinate. 10 ml of water was then drained from the balloon.  A 18 FR foley cath was removed from the bladder no complications were noted .  Patient as then given some time to void on their own.  Patient can void  125 ml on their own after some time.  Patient tolerated well.  Performed by: Kennyth Lose, CMA  Follow up/ Additional notes: Keep scheduled appt

## 2022-09-02 ENCOUNTER — Telehealth: Payer: Self-pay

## 2022-09-02 ENCOUNTER — Telehealth: Payer: Self-pay | Admitting: Pharmacy Technician

## 2022-09-02 ENCOUNTER — Other Ambulatory Visit (HOSPITAL_COMMUNITY): Payer: Self-pay

## 2022-09-02 ENCOUNTER — Encounter: Payer: Self-pay | Admitting: Urology

## 2022-09-02 NOTE — Telephone Encounter (Signed)
Patient Advocate Encounter   Received notification that prior authorization for Nurtec  dispersible tablets is required.   PA submitted on 09/02/2022 Key [id number] Insurance [employer] Electronic Prior Authorization Form Status is pending

## 2022-09-02 NOTE — Telephone Encounter (Signed)
Status of PA in separate encounter

## 2022-09-02 NOTE — Patient Instructions (Signed)

## 2022-09-23 ENCOUNTER — Other Ambulatory Visit (HOSPITAL_COMMUNITY): Payer: Self-pay

## 2022-09-23 NOTE — Telephone Encounter (Signed)
Patient Advocate Encounter  Prior Authorization for NURTEC 75MG  has been approved with [employer].    PA# [id number] Effective dates: 4.24.24 through 4.23.25  Per [employer] test claim, copay for 30 days supply is $0 w/eVoucher   PARTIALLY APPROVED PA. Plan allows you to receive 18 tablets for one month. Medication quantities above the maximum benefit limit are excluded under the plan.

## 2022-09-25 ENCOUNTER — Telehealth: Payer: Self-pay

## 2022-09-25 NOTE — Telephone Encounter (Signed)
Patient left a voicemail requesting a call back.  No other details were left on his message as to why he was calling.  Returned call with no answer, left a voicemail to call back with questions.

## 2022-09-26 ENCOUNTER — Encounter: Payer: Self-pay | Admitting: Neurology

## 2022-10-13 ENCOUNTER — Telehealth: Payer: Self-pay

## 2022-10-13 NOTE — Telephone Encounter (Signed)
Patient called and requested prescription for urine leakage. He advised he has been having issues with not being able to control his bladder.   Pharmacy:  [facility] [address], [place] - [phone] [place] [address]     Thank you

## 2022-10-24 ENCOUNTER — Other Ambulatory Visit: Payer: Self-pay | Admitting: Neurology

## 2022-10-24 ENCOUNTER — Encounter: Payer: Self-pay | Admitting: Neurology

## 2022-10-24 DIAGNOSIS — G43009 Migraine without aura, not intractable, without status migrainosus: Secondary | ICD-10-CM

## 2022-10-24 MED ORDER — TOPIRAMATE 50 MG PO TABS
50.0000 mg | ORAL_TABLET | Freq: Two times a day (BID) | ORAL | 3 refills | Status: DC
Start: 2022-10-24 — End: 2023-09-03

## 2022-12-01 ENCOUNTER — Telehealth: Payer: Self-pay

## 2022-12-01 ENCOUNTER — Ambulatory Visit: Payer: Medicare Other | Admitting: Urology

## 2022-12-01 VITALS — BP 88/57 | HR 89 | Ht 68.0 in | Wt 110.0 lb

## 2022-12-01 DIAGNOSIS — K432 Incisional hernia without obstruction or gangrene: Secondary | ICD-10-CM

## 2022-12-01 DIAGNOSIS — Z8551 Personal history of malignant neoplasm of bladder: Secondary | ICD-10-CM | POA: Diagnosis not present

## 2022-12-01 DIAGNOSIS — C679 Malignant neoplasm of bladder, unspecified: Secondary | ICD-10-CM

## 2022-12-01 MED ORDER — CIPROFLOXACIN HCL 500 MG PO TABS
500.0000 mg | ORAL_TABLET | Freq: Once | ORAL | Status: AC
Start: 2022-12-01 — End: 2022-12-01
  Administered 2022-12-01: 500 mg via ORAL

## 2022-12-01 NOTE — Telephone Encounter (Signed)
Return vm call to patient. Patient states he forgot to mention in office today that he was still leaking.

## 2022-12-01 NOTE — Progress Notes (Signed)
   12/01/22  CC: followup bladder mass   HPI: [name] is a 72yo here for followup for ureteral cancer Blood pressure (!) 88/57, pulse 89, height 5\' 8"  (1.727 m), weight 110 lb (49.9 kg). NED. A&Ox3.   No respiratory distress   Abd soft, NT, ND Normal phallus with bilateral descended testicles  Cystoscopy Procedure Note  Patient identification was confirmed, informed consent was obtained, and patient was prepped using Betadine solution.  Lidocaine jelly was administered per urethral meatus.     Pre-Procedure: - Inspection reveals a normal caliber ureteral meatus.  Procedure: The flexible cystoscope was introduced without difficulty - No urethral strictures/lesions are present. - Enlarged prostate  - Normal bladder neck - left ureteral orifice identified. Right surgically absent - Bladder mucosa  reveals no ulcers, tumors, or lesions - No bladder stones - No trabeculation   Post-Procedure: - Patient tolerated the procedure well  Assessment/ Plan: Referral to Dr Lovell Sheehan for incisional hernia  No follow-ups on file.  Wilkie Aye, MD

## 2022-12-02 ENCOUNTER — Other Ambulatory Visit: Payer: Self-pay

## 2022-12-02 DIAGNOSIS — N1330 Unspecified hydronephrosis: Secondary | ICD-10-CM

## 2022-12-02 DIAGNOSIS — N200 Calculus of kidney: Secondary | ICD-10-CM

## 2022-12-02 DIAGNOSIS — C641 Malignant neoplasm of right kidney, except renal pelvis: Secondary | ICD-10-CM

## 2022-12-02 DIAGNOSIS — C679 Malignant neoplasm of bladder, unspecified: Secondary | ICD-10-CM

## 2022-12-02 DIAGNOSIS — K432 Incisional hernia without obstruction or gangrene: Secondary | ICD-10-CM

## 2022-12-02 LAB — CYTOLOGY, URINE

## 2022-12-02 MED ORDER — GEMTESA 75 MG PO TABS
75.0000 mg | ORAL_TABLET | Freq: Every day | ORAL | Status: AC
Start: 2022-12-02 — End: ?

## 2022-12-02 NOTE — Telephone Encounter (Signed)
Tried calling patient with no answer, left Vm for return call. gemtesa samples upfront

## 2022-12-03 ENCOUNTER — Encounter: Payer: Self-pay | Admitting: Urology

## 2022-12-03 NOTE — Telephone Encounter (Signed)
Tried calling patient with no answer. Left vm making patient aware of samples up front
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
## 2022-12-09 ENCOUNTER — Encounter: Payer: Self-pay | Admitting: Urology

## 2022-12-09 NOTE — Patient Instructions (Signed)

## 2022-12-11 ENCOUNTER — Ambulatory Visit: Payer: Medicare Other | Admitting: Neurology

## 2022-12-11 ENCOUNTER — Telehealth: Payer: Self-pay

## 2022-12-11 ENCOUNTER — Encounter: Payer: Self-pay | Admitting: Neurology

## 2022-12-11 VITALS — BP 101/65 | HR 86 | Ht 68.0 in | Wt 110.0 lb

## 2022-12-11 DIAGNOSIS — M542 Cervicalgia: Secondary | ICD-10-CM

## 2022-12-11 DIAGNOSIS — G8921 Chronic pain due to trauma: Secondary | ICD-10-CM

## 2022-12-11 DIAGNOSIS — G43009 Migraine without aura, not intractable, without status migrainosus: Secondary | ICD-10-CM

## 2022-12-11 DIAGNOSIS — G8929 Other chronic pain: Secondary | ICD-10-CM

## 2022-12-11 MED ORDER — AIMOVIG 140 MG/ML ~~LOC~~ SOAJ: 140.0000 mg | SUBCUTANEOUS | 11 refills | Status: DC

## 2022-12-11 NOTE — Patient Instructions (Signed)
I will try to get a newer migraine medication approved for you, called Aimovig.  Continue your current medications in the meantime. It may take a month or more to get this new medication approved. Nortriptyline 100 mg daily Topamax 50 mg twice daily Nurtec as needed at onset of headache  You may consider cutting back on caffeine to help with headaches as well.  I will be in touch when I know the results of asking your insurance about Aimovig.  The physicians and staff at [facility] are committed to providing excellent care. You may receive a survey requesting feedback about your experience at our office. We strive to receive "very good" responses to the survey questions. If you feel that your experience would prevent you from giving the office a "very good " response, please contact our office to try to remedy the situation. We may be reached at [phone]. Thank you for taking the time out of your busy day to complete the survey.  Jacquelyne Balint, MD [facility]

## 2022-12-11 NOTE — Telephone Encounter (Signed)
-----   Message from Antony Madura sent at 12/11/2022  2:26 PM EDT ----- Regarding: Aimovig PA I ordered Aimovig for patient today. Can we alert the PA team so this can be started to get approved?  Thank you,  Riki Rusk

## 2022-12-11 NOTE — Telephone Encounter (Signed)
Text PA team 12/11/2022 for Aimovig for patient today.

## 2022-12-16 NOTE — Telephone Encounter (Signed)
Pharmacy Patient Advocate Encounter   Received notification from Physician's Office that prior authorization for Aimovig 140mg  is required/requested.   Insurance verification completed.   The patient is insured through [employer] .   Per test claim: PA required; PA submitted to [facility] via CoverMyMeds Key/confirmation #/EOC Key: [id number] Status is pending

## 2022-12-17 ENCOUNTER — Other Ambulatory Visit (HOSPITAL_COMMUNITY): Payer: Self-pay

## 2022-12-17 NOTE — Telephone Encounter (Signed)
This medication or product is on your plan's list of covered drugs. Prior authorization is not required at this time. If your pharmacy has questions regarding the processing of your prescription, please have them call  Per test claim medication is too soon to refill at this time.

## 2022-12-17 NOTE — Telephone Encounter (Signed)
LMOVM to check make sure patient had picked up his Aimovig 140 mg.

## 2022-12-23 ENCOUNTER — Other Ambulatory Visit (HOSPITAL_COMMUNITY): Payer: Self-pay

## 2022-12-24 NOTE — Telephone Encounter (Signed)
Called and left message to return call to office.

## 2022-12-25 ENCOUNTER — Telehealth: Payer: Self-pay | Admitting: Neurology

## 2022-12-25 ENCOUNTER — Encounter: Payer: Self-pay | Admitting: General Surgery

## 2022-12-25 ENCOUNTER — Ambulatory Visit: Payer: Medicare Other | Admitting: General Surgery

## 2022-12-25 VITALS — BP 112/74 | HR 73 | Temp 97.9°F | Resp 12 | Ht 68.0 in | Wt 112.0 lb

## 2022-12-25 DIAGNOSIS — K432 Incisional hernia without obstruction or gangrene: Secondary | ICD-10-CM | POA: Diagnosis not present

## 2022-12-25 NOTE — Telephone Encounter (Signed)
Patient is returning a call from yesterday.

## 2022-12-25 NOTE — Telephone Encounter (Signed)
No anwer at 358

## 2022-12-25 NOTE — Progress Notes (Signed)
[facility] History and Physical  Reason for Referral: Incisional hernias Referring Physician: Wilkie Aye  Chief Complaint   New Patient (Initial Visit); Hernia     Dustin Walker is a 72 y.o. male.  HPI: Patient is here complaining of three hernias in his abdomen. He notes two small ones and one big one. They are not painful. The two small ones occurred last year. The large one occurred last month. They are in the midline and slightly to the right tracking down. He can push them in but they immediately pop out. They are always out.  He has Bms every three days. Off oxy since August 2nd. He used to be exteremly constipated while on them and required laxatives. Nowadays, he strains when he has a BM, not painful. He still uses laxatives occasionally. No nausea or vomiting.  When he last had a small bladder tumor removed (April 2024), he notes being given liquid chemo.  Denies any other belly surgeries.  Smokes 1 ppd. No alcohol. No drugs. No blood thinners. Does not use O2 at home for COPD.   Past Medical History:  Diagnosis Date   Allergic rhinitis    Arthritis    BCC (basal cell carcinoma) 04/02/2015   left upper eyelid, 06/12/15 MOHs   Chronic pain    left hand, crush injury in 2013   COPD (chronic obstructive pulmonary disease) (HCC)    GERD (gastroesophageal reflux disease)    Headache    migraines   SCC (squamous cell carcinoma) 10/29/2015   well diff, upper mid bac (CX3, 5FU)    Past Surgical History:  Procedure Laterality Date   BLADDER INSTILLATION N/A 08/21/2022   Procedure: BLADDER INSTILLATION;  Surgeon: [name], MD;  Location: [facility];  Service: Urology;  Laterality: N/A;   COLONOSCOPY  2010   Dr. [name]: tubular adenoma removed. was due for surveillance in 2015.   COLONOSCOPY WITH PROPOFOL N/A 02/22/2018   Procedure: COLONOSCOPY WITH PROPOFOL;  Surgeon: Corbin Ade, MD;  Location: [facility];  Service: Endoscopy;   Laterality: N/A;  12:00pm   CYSTECTOMY N/A 03/31/2022   Procedure: CYSTECTOMY PARTIAL;  Surgeon: [name], MD;  Location: [facility];  Service: Urology;  Laterality: N/A;   CYSTOSCOPY Right 12/06/2021   Procedure: CYSTOSCOPY;  Surgeon: [name], MD;  Location: [facility];  Service: Urology;  Laterality: Right;   CYSTOSCOPY N/A 03/31/2022   Procedure: CYSTOSCOPY;  Surgeon: [name], MD;  Location: [facility];  Service: Urology;  Laterality: N/A;   CYSTOSCOPY N/A 08/21/2022   Procedure: CYSTOSCOPY;  Surgeon: [name], MD;  Location: [facility];  Service: Urology;  Laterality: N/A;   CYSTOSCOPY WITH RETROGRADE PYELOGRAM, URETEROSCOPY AND STENT PLACEMENT Right 02/24/2022   Procedure: CYSTOSCOPY WITH RETROGRADE PYELOGRAM,   AND STENT PLACEMENT;  Surgeon: [name], MD;  Location: [facility];  Service: Urology;  Laterality: Right;   HAND SURGERY Right    middle finger   I & D EXTREMITY  06/19/2011   Procedure: IRRIGATION AND DEBRIDEMENT EXTREMITY;  Surgeon: [name], MD;  Location: [facility];  Service: Plastics;;   IR NEPHROSTOGRAM RIGHT THRU EXISTING ACCESS  02/10/2022   IR NEPHROSTOMY PLACEMENT RIGHT  12/20/2021   IR NEPHROSTOMY PLACEMENT RIGHT  02/05/2022   JOINT REPLACEMENT     knee   KNEE SURGERY Bilateral    6 times per knee and total knee replacement on the left   ORIF ULNAR FRACTURE  06/19/2011  Procedure: OPEN REDUCTION INTERNAL FIXATION (ORIF) ULNAR FRACTURE;  Surgeon: [name], MD;  Location: [facility];  Service: Plastics;  Laterality: Left;   ROBOT ASSISTED LAPAROSCOPIC NEPHRECTOMY Right 03/31/2022   Procedure: XI ROBOTIC ASSISTED LAPAROSCOPIC NEPHRECTOMY/URETERCTOMY;  Surgeon: [name], MD;  Location: [facility];  Service: Urology;  Laterality: Right;   SHOULDER SURGERY Right    SPINAL CORD STIMULATOR BATTERY EXCHANGE Left 02/11/2021   Procedure: REPLACEMENT PULSE GENERATOR LEFT FLANK (MEDTRONIC);  Surgeon: [name], MD;  Location:  [facility];  Service: Neurosurgery;  Laterality: Left;  LOCAL W/ MAC   TRANSURETHRAL RESECTION OF BLADDER TUMOR N/A 12/06/2021   Procedure: TRANSURETHRAL RESECTION OF BLADDER TUMOR (TURBT);  Surgeon: [name], MD;  Location: [facility];  Service: Urology;  Laterality: N/A;   TRANSURETHRAL RESECTION OF BLADDER TUMOR N/A 03/31/2022   Procedure: TRANSURETHRAL RESECTION OF BLADDER TUMOR (TURBT);  Surgeon: [name], MD;  Location: [facility];  Service: Urology;  Laterality: N/A;   TRANSURETHRAL RESECTION OF BLADDER TUMOR N/A 08/21/2022   Procedure: TRANSURETHRAL RESECTION OF BLADDER TUMOR (TURBT);  Surgeon: [name], MD;  Location: [facility];  Service: Urology;  Laterality: N/A;   URETERAL BIOPSY Right 02/24/2022   Procedure: URETERAL BIOPSY;  Surgeon: [name], MD;  Location: [facility];  Service: Urology;  Laterality: Right;    Family History  Problem Relation Age of Onset   Colon cancer Neg Hx     Social History   Tobacco Use   Smoking status: Every Day    Current packs/day: 1.00    Average packs/day: 1 pack/day for 60.3 years (60.3 ttl pk-yrs)    Types: Cigarettes    Start date: 09/12/1962   Smokeless tobacco: Never  Vaping Use   Vaping status: Former  Substance Use Topics   Alcohol use: Not Currently   Drug use: No    Medications: Prior to Admission: (Not in a hospital admission)  Allergies as of 12/25/2022       Reactions   Naproxen Sodium Rash, Other (See Comments)   "Burns from inside out" Aleve   Codeine Rash, Other (See Comments)   Burning        Medication List        Accurate as of December 25, 2022 11:56 AM. If you have any questions, ask your nurse or doctor.          STOP taking these medications    oxyCODONE-acetaminophen 7.5-325 MG tablet Commonly known as: PERCOCET Stopped by: Franky Macho       TAKE these medications    Aimovig 140 MG/ML Soaj Generic drug: Erenumab-aooe Inject 140 mg into the skin every 28  (twenty-eight) days.   Gemtesa 75 MG Tabs Generic drug: Vibegron Take 1 tablet (75 mg total) by mouth daily.   Magnesium 250 MG Tabs Take 500 mg by mouth daily.   multivitamin with minerals Tabs tablet Take 1 tablet by mouth daily.   nortriptyline 50 MG capsule Commonly known as: PAMELOR Take 2 capsules (100 mg total) by mouth at bedtime.   Nurtec 75 MG Tbdp Generic drug: Rimegepant Sulfate Take 1 tablet (75 mg total) by mouth daily as needed (Migraine).   pantoprazole 40 MG tablet Commonly known as: PROTONIX Take 1 tablet (40 mg total) by mouth daily. What changed: when to take this   TheraTears Extra 0.25 % Soln Generic drug: Carboxymethylcellulose Sodium Place 1 drop into both eyes as needed (dry eyes).   topiramate 50 MG tablet Commonly known  as: TOPAMAX Take 1 tablet (50 mg total) by mouth 2 (two) times daily.         ROS:  Pertinent items are noted in HPI.  Blood pressure 112/74, pulse 73, temperature 97.9 F (36.6 C), temperature source Oral, resp. rate 12, height 5\' 8"  (1.727 m), weight 112 lb (50.8 kg), SpO2 97%.  Physical Exam: General: pleasant and well-appearing CV: RRR, no murmurs Pulm: no increased work of breathing. CTAB Abdominal: suprapubic midline incision inferior to umbilicus. Soft, nondistended. No tenderness to palpation, rebound tenderness or guarding. Hernia defects detailed from top to bottom of the abdomen:  - 1 small, subcostal hernia at midline (previous port site) - 1 medium periumbilical hernia (~3 cm in diameter) slightly to the right of umbilicus - 1 small infraumbilical hernia at midline (previous port site) - 1 very small suprapubic hernia slightly to the right of midline Psych: A&O x3   Results: No results found for this or any previous visit (from the past 48 hour(s)).  No results found.   Assessment & Plan:  RJAY KIRSHENBAUM is a 72 y.o. male with Mhx of COPD and right ureteral TCC s/p right nephroureterectomy and  transurethral resection of medium bladder tumor (03/2022) and repeat transurethral resection of small bladder tumor (08/2022) presenting with 4 incisional hernias along his midline abdomen in the location of previous port sites. He may have more incisional hernias along his abdomen as patient has had multiple surgeries. Either way, they are not bothering him and have not caused him pain, emesis, worsened constipation. The locations of the hernias would warrant a large mesh and pose the risk for creating more incisional hernias. He would like to avoid surgery. Recommend monitoring of hernias for now and consistent bowel regimen. Patient advised to return to clinic should he develop symptoms.  -Continue to monitor incisional hernias, no need for surgical intervention at the moment -Recommend fiber and consistent bowel regimen including miralax or milk of magnesia -Follow up prn  All questions were answered to the satisfaction of the patient.   Dominica Severin 12/25/2022, 11:56 AM

## 2022-12-26 NOTE — Telephone Encounter (Signed)
No answer at 830.

## 2022-12-29 ENCOUNTER — Telehealth: Payer: Self-pay

## 2022-12-29 NOTE — Telephone Encounter (Signed)
-----   Message from Bryson Dames sent at 12/23/2022  9:19 AM EDT ----- Test claim shows this was filled on 12-11-2022 but does not show where. Next fill available on 01-01-2023 ----- Message ----- From: Lenise Herald, LPN Sent: 11/17/2954   3:05 PM EDT To: Rx Prior Auth Team  I ordered Aimovig for patient today. Can we alert the PA team so this can be started to get approved? Thank you

## 2022-12-29 NOTE — Telephone Encounter (Signed)
Called and pt reported having all meds. From Dr. Loleta Chance.

## 2022-12-29 NOTE — Telephone Encounter (Signed)
Called pt and he has all meds .

## 2023-01-01 ENCOUNTER — Ambulatory Visit: Payer: Medicare Other | Admitting: General Surgery

## 2023-01-05 ENCOUNTER — Encounter: Payer: Self-pay | Admitting: Neurology

## 2023-01-06 ENCOUNTER — Telehealth: Payer: Self-pay

## 2023-01-13 ENCOUNTER — Telehealth: Payer: Self-pay

## 2023-01-13 NOTE — Telephone Encounter (Signed)
Patient called and made aware. NV appointment made for urine drop off.

## 2023-01-13 NOTE — Telephone Encounter (Signed)
-----   Message from Wilkie Aye sent at 01/13/2023  8:50 AM EDT ----- Have him get a FISH ----- Message ----- From: Gustavus Messing, LPN Sent: 08/23/2438   4:31 PM EDT To: [name], MD  Please review F/u 10/28

## 2023-01-14 ENCOUNTER — Ambulatory Visit: Payer: Medicare Other

## 2023-01-14 DIAGNOSIS — C679 Malignant neoplasm of bladder, unspecified: Secondary | ICD-10-CM

## 2023-01-20 ENCOUNTER — Other Ambulatory Visit (HOSPITAL_COMMUNITY): Payer: Self-pay

## 2023-01-20 NOTE — Telephone Encounter (Signed)
[facility] called stating that FISH specimen had no patient identifier or the collection type.  I confirmed with her that the collection type was voided specimen.  They will fax over a form for the office to sign to confirm that that specimen belongs to the patient and we are taking responsibility for results.  Office fax number provided and directed to Nurse for 09/04.

## 2023-01-22 ENCOUNTER — Other Ambulatory Visit (HOSPITAL_COMMUNITY): Payer: Self-pay

## 2023-01-27 NOTE — Telephone Encounter (Signed)
Patient called for an update on cytology results. Did you receive forms from [facility] requesting more information?  Is there an update at this time.

## 2023-01-28 ENCOUNTER — Encounter: Payer: Self-pay | Admitting: Urology

## 2023-01-28 NOTE — Telephone Encounter (Signed)
Follow up with [facility] about patient cystology results. Cytology results were completed on 01/27/23 and Almira Coaster states she will faxed the results.

## 2023-03-09 ENCOUNTER — Other Ambulatory Visit: Payer: Medicare Other | Admitting: Urology

## 2023-04-15 ENCOUNTER — Ambulatory Visit: Payer: Medicare Other | Admitting: Urology

## 2023-04-15 VITALS — BP 90/57 | HR 92

## 2023-04-15 DIAGNOSIS — Z8551 Personal history of malignant neoplasm of bladder: Secondary | ICD-10-CM | POA: Diagnosis not present

## 2023-04-15 DIAGNOSIS — C679 Malignant neoplasm of bladder, unspecified: Secondary | ICD-10-CM

## 2023-04-15 MED ORDER — CIPROFLOXACIN HCL 500 MG PO TABS
500.0000 mg | ORAL_TABLET | Freq: Once | ORAL | Status: AC
Start: 2023-04-15 — End: 2023-04-15
  Administered 2023-04-15: 500 mg via ORAL

## 2023-04-15 NOTE — Progress Notes (Signed)
   04/15/23  CC: followup bladder cancer   HPI: Dustin Walker is a 72yo here for cystoscopy for bladder cancer Blood pressure (!) 90/57, pulse 92. NED. A&Ox3.   No respiratory distress   Abd soft, NT, ND Normal phallus with bilateral descended testicles  Cystoscopy Procedure Note  Patient identification was confirmed, informed consent was obtained, and patient was prepped using Betadine solution.  Lidocaine jelly was administered per urethral meatus.     Pre-Procedure: - Inspection reveals a normal caliber ureteral meatus.  Procedure: The flexible cystoscope was introduced without difficulty - No urethral strictures/lesions are present. - Enlarged prostate  - Normal bladder neck - Bilateral ureteral orifices identified - Bladder mucosa  reveals no ulcers, tumors, or lesions - No bladder stones - No trabeculation     Post-Procedure: - Patient tolerated the procedure well  Assessment/ Plan: Followup 3 months for cystoscopy  No follow-ups on file.  Wilkie Aye, MD

## 2023-04-21 ENCOUNTER — Encounter: Payer: Self-pay | Admitting: Urology

## 2023-04-21 NOTE — Patient Instructions (Signed)

## 2023-04-28 ENCOUNTER — Other Ambulatory Visit: Payer: Self-pay | Admitting: Neurology

## 2023-04-28 DIAGNOSIS — G43009 Migraine without aura, not intractable, without status migrainosus: Secondary | ICD-10-CM

## 2023-05-26 ENCOUNTER — Other Ambulatory Visit: Payer: Self-pay | Admitting: Neurology

## 2023-05-26 DIAGNOSIS — G43009 Migraine without aura, not intractable, without status migrainosus: Secondary | ICD-10-CM
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
## 2023-06-17 ENCOUNTER — Ambulatory Visit: Payer: Medicare Other | Admitting: Neurology

## 2023-06-17 ENCOUNTER — Encounter: Payer: Self-pay | Admitting: Neurology

## 2023-06-17 ENCOUNTER — Other Ambulatory Visit: Payer: Self-pay | Admitting: Neurology

## 2023-06-17 VITALS — BP 99/64 | HR 76 | Ht 68.0 in | Wt 118.0 lb

## 2023-06-17 DIAGNOSIS — M542 Cervicalgia: Secondary | ICD-10-CM | POA: Diagnosis not present

## 2023-06-17 DIAGNOSIS — R519 Headache, unspecified: Secondary | ICD-10-CM

## 2023-06-17 DIAGNOSIS — G8929 Other chronic pain: Secondary | ICD-10-CM

## 2023-06-17 DIAGNOSIS — G43009 Migraine without aura, not intractable, without status migrainosus: Secondary | ICD-10-CM

## 2023-06-17 DIAGNOSIS — G8921 Chronic pain due to trauma: Secondary | ICD-10-CM

## 2023-06-17 NOTE — Patient Instructions (Signed)
 Migraine prevention:  Continue Nortriptyline  100 mg daily, Topamax  50 mg BID, and Aimovig  140 mg injection every 28 days  Migraine rescue:  Continue Nurtec 75 mg as needed at headache onset Limit use of pain relievers to no more than 2 days out of week to prevent risk of rebound or medication-overuse headache. Keep headache diary  Return to clinic in 6 months  Please let me know if you have any questions or concerns in the meantime.  Venetia Potters, MD [facility] Neurology

## 2023-07-15 ENCOUNTER — Ambulatory Visit: Payer: Medicare Other | Admitting: Urology

## 2023-07-15 VITALS — BP 91/61 | HR 92

## 2023-07-15 DIAGNOSIS — Z8551 Personal history of malignant neoplasm of bladder: Secondary | ICD-10-CM

## 2023-07-15 DIAGNOSIS — C679 Malignant neoplasm of bladder, unspecified: Secondary | ICD-10-CM

## 2023-07-15 LAB — MICROSCOPIC EXAMINATION: RBC, Urine: NONE SEEN /HPF (ref 0–2)

## 2023-07-15 LAB — URINALYSIS, ROUTINE W REFLEX MICROSCOPIC
Bilirubin, UA: NEGATIVE
Glucose, UA: NEGATIVE
Ketones, UA: NEGATIVE
Nitrite, UA: POSITIVE — AB
Protein,UA: NEGATIVE
RBC, UA: NEGATIVE
Specific Gravity, UA: 1.015 (ref 1.005–1.030)
Urobilinogen, Ur: 0.2 mg/dL (ref 0.2–1.0)
pH, UA: 6 (ref 5.0–7.5)

## 2023-07-15 MED ORDER — CIPROFLOXACIN HCL 500 MG PO TABS
500.0000 mg | ORAL_TABLET | Freq: Once | ORAL | Status: AC
Start: 2023-07-15 — End: 2023-07-15
  Administered 2023-07-15: 500 mg via ORAL

## 2023-07-15 NOTE — Progress Notes (Signed)
   07/15/23  CC: followup bladder cancer   HPI: [name] is a 72yo here for followup for bladder cancer Blood pressure 91/61, pulse 92. NED. A&Ox3.   No respiratory distress   Abd soft, NT, ND Normal phallus with bilateral descended testicles  Cystoscopy Procedure Note  Patient identification was confirmed, informed consent was obtained, and patient was prepped using Betadine solution.  Lidocaine jelly was administered per urethral meatus.     Pre-Procedure: - Inspection reveals a normal caliber ureteral meatus.  Procedure: The flexible cystoscope was introduced without difficulty - No urethral strictures/lesions are present. - Enlarged prostate  - Normal bladder neck - left ureteral orifices identified - Bladder mucosa  reveals no ulcers, tumors, or lesions - No bladder stones - No trabeculation    Post-Procedure: - Patient tolerated the procedure well  Assessment/ Plan: Followup 6 months for cystoscopy  No follow-ups on file.  Wilkie Aye, MD

## 2023-07-21 ENCOUNTER — Encounter: Payer: Self-pay | Admitting: Urology

## 2023-07-21 NOTE — Patient Instructions (Signed)

## 2023-09-01 ENCOUNTER — Other Ambulatory Visit: Payer: Self-pay | Admitting: Neurology

## 2023-09-01 DIAGNOSIS — G43009 Migraine without aura, not intractable, without status migrainosus: Secondary | ICD-10-CM

## 2023-09-03 ENCOUNTER — Telehealth: Payer: Self-pay

## 2023-09-03 ENCOUNTER — Other Ambulatory Visit: Payer: Self-pay

## 2023-09-03 ENCOUNTER — Telehealth: Payer: Self-pay | Admitting: Neurology

## 2023-09-03 DIAGNOSIS — G43009 Migraine without aura, not intractable, without status migrainosus: Secondary | ICD-10-CM

## 2023-09-03 MED ORDER — TOPIRAMATE 50 MG PO TABS: 50.0000 mg | ORAL_TABLET | Freq: Two times a day (BID) | ORAL | 1 refills | Status: DC

## 2023-09-03 NOTE — Telephone Encounter (Signed)
 Pt. Said Dr. Genita Keys wanted him to cal  when he only had 2 shots of Aimovig  left, needs refill

## 2023-09-03 NOTE — Telephone Encounter (Signed)
 Pt. Also needs refill for Rx TOPAMAX 

## 2023-09-03 NOTE — Telephone Encounter (Signed)
 Refill Topamax  and put in a new PA for Aimovig  that runs out in June waiting on that.

## 2023-09-04 ENCOUNTER — Other Ambulatory Visit (HOSPITAL_COMMUNITY): Payer: Self-pay

## 2023-09-04 ENCOUNTER — Telehealth: Payer: Self-pay | Admitting: Pharmacy Technician

## 2023-09-04 NOTE — Telephone Encounter (Signed)
 Hi there, hope all is well. PA is not needed for either Aimovig  or Nurtec. Please see telephone encounter 4.25.25.

## 2023-09-04 NOTE — Telephone Encounter (Signed)
 Pharmacy Patient Advocate Encounter  Insurance verification completed.   The patient is insured through [facility] MEDICAID   Ran test claim for NURTEC 75MG . Currently a quantity of 8 is a 30 day supply and the co-pay is $0 .   This test claim was processed through [facility]- copay amounts may vary at other pharmacies due to pharmacy/plan contracts, or as the patient moves through the different stages of their insurance plan.

## 2023-09-04 NOTE — Telephone Encounter (Signed)
 Pharmacy Patient Advocate Encounter  Insurance verification completed.   The patient is insured through [facility] MEDICAID   Ran test claim for AIMOVIG  140MG . Currently a quantity of 1 is a 28 day supply and the co-pay is UNABLE TO PROVIDE DUE TO PATIENT LAST FILLING ON 4.20.25 .

## 2023-09-19 ENCOUNTER — Other Ambulatory Visit: Payer: Self-pay | Admitting: Neurology

## 2023-09-19 DIAGNOSIS — G43009 Migraine without aura, not intractable, without status migrainosus: Secondary | ICD-10-CM

## 2023-10-31 ENCOUNTER — Encounter: Payer: Self-pay | Admitting: Neurology

## 2023-11-02 ENCOUNTER — Other Ambulatory Visit: Payer: Self-pay

## 2023-11-02 MED ORDER — AIMOVIG 140 MG/ML ~~LOC~~ SOAJ: 140.0000 mg | SUBCUTANEOUS | 2 refills | Status: DC

## 2023-11-03 ENCOUNTER — Telehealth: Payer: Self-pay

## 2023-11-04 ENCOUNTER — Other Ambulatory Visit (HOSPITAL_COMMUNITY): Payer: Self-pay

## 2023-11-04 NOTE — Telephone Encounter (Signed)
 PA is not needed. Last filled on 6.19.25
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
## 2023-12-17 ENCOUNTER — Ambulatory Visit: Payer: Medicare Other | Admitting: Neurology

## 2023-12-17 ENCOUNTER — Encounter: Payer: Self-pay | Admitting: Neurology

## 2023-12-17 VITALS — BP 111/68 | HR 87 | Ht 68.0 in | Wt 114.0 lb

## 2023-12-17 DIAGNOSIS — G8921 Chronic pain due to trauma: Secondary | ICD-10-CM | POA: Diagnosis not present

## 2023-12-17 DIAGNOSIS — G43009 Migraine without aura, not intractable, without status migrainosus: Secondary | ICD-10-CM

## 2023-12-17 DIAGNOSIS — M542 Cervicalgia: Secondary | ICD-10-CM

## 2023-12-17 MED ORDER — AIMOVIG 140 MG/ML ~~LOC~~ SOAJ
140.0000 mg | SUBCUTANEOUS | 10 refills | Status: AC
Start: 2023-12-17 — End: ?

## 2023-12-17 NOTE — Patient Instructions (Signed)
 Migraine prevention:  Continue Nortriptyline  100 mg daily, Topamax  50 mg BID, and Aimovig  140 mg injection every 28 days  Migraine rescue:  Continue Nurtec 75 mg as needed at headache onset Limit use of pain relievers to no more than 2 days out of week to prevent risk of rebound or medication-overuse headache. Keep headache diary  Return to clinic in 6 months   Please let me know if you have any questions or concerns in the meantime.   The physicians and staff at [facility] are committed to providing excellent care. You may receive a survey requesting feedback about your experience at our office. We strive to receive very good responses to the survey questions. If you feel that your experience would prevent you from giving the office a very good  response, please contact our office to try to remedy the situation. We may be reached at [phone]. Thank you for taking the time out of your busy day to complete the survey.  Venetia Potters, MD [facility]

## 2024-01-09 ENCOUNTER — Encounter: Payer: Self-pay | Admitting: Neurology

## 2024-01-18 ENCOUNTER — Ambulatory Visit: Admitting: Urology

## 2024-01-18 ENCOUNTER — Encounter: Payer: Self-pay | Admitting: Urology

## 2024-01-18 VITALS — BP 99/49 | HR 84

## 2024-01-18 DIAGNOSIS — C679 Malignant neoplasm of bladder, unspecified: Secondary | ICD-10-CM

## 2024-01-18 MED ORDER — CIPROFLOXACIN HCL 500 MG PO TABS
500.0000 mg | ORAL_TABLET | Freq: Once | ORAL | Status: AC
Start: 2024-01-18 — End: 2024-01-18
  Administered 2024-01-18: 500 mg via ORAL

## 2024-01-18 NOTE — Patient Instructions (Signed)

## 2024-01-18 NOTE — Progress Notes (Signed)
   01/18/24  CC: bladder cancer  HPI: [name] is a 73you here for followup for low grade bladder cancer Blood pressure (!) 99/49, pulse 84. NED. A&Ox3.   No respiratory distress   Abd soft, NT, ND Normal phallus with bilateral descended testicles  Cystoscopy Procedure Note  Patient identification was confirmed, informed consent was obtained, and patient was prepped using Betadine  solution.  Lidocaine  jelly was administered per urethral meatus.     Pre-Procedure: - Inspection reveals a normal caliber ureteral meatus.  Procedure: The flexible cystoscope was introduced without difficulty - No urethral strictures/lesions are present. - Enlarged prostate  - Normal bladder neck - left ureteral orifices identified - Bladder mucosa  reveals no ulcers, tumors, or lesions - No bladder stones - No trabeculation     Post-Procedure: - Patient tolerated the procedure well  Assessment/ Plan: Followup 6 months for cystoscopy  No follow-ups on file.  Belvie Clara, MD

## 2024-01-26 ENCOUNTER — Other Ambulatory Visit: Payer: Self-pay | Admitting: Otolaryngology

## 2024-01-26 DIAGNOSIS — H9042 Sensorineural hearing loss, unilateral, left ear, with unrestricted hearing on the contralateral side: Secondary | ICD-10-CM

## 2024-02-16 NOTE — Telephone Encounter (Signed)
 Dustin Walker

## 2024-03-14 NOTE — Telephone Encounter (Signed)
 SABRA

## 2024-04-05 ENCOUNTER — Other Ambulatory Visit: Payer: Self-pay | Admitting: Neurology

## 2024-04-05 DIAGNOSIS — G43009 Migraine without aura, not intractable, without status migrainosus: Secondary | ICD-10-CM

## 2024-04-11 ENCOUNTER — Encounter: Payer: Self-pay | Admitting: Neurology

## 2024-04-12 ENCOUNTER — Other Ambulatory Visit: Payer: Self-pay

## 2024-04-12 DIAGNOSIS — G43009 Migraine without aura, not intractable, without status migrainosus: Secondary | ICD-10-CM

## 2024-04-12 MED ORDER — TOPIRAMATE 50 MG PO TABS: 50.0000 mg | ORAL_TABLET | Freq: Two times a day (BID) | ORAL | 0 refills | Status: AC

## 2024-05-03 ENCOUNTER — Other Ambulatory Visit: Payer: Self-pay | Admitting: Neurology

## 2024-05-03 DIAGNOSIS — G43009 Migraine without aura, not intractable, without status migrainosus: Secondary | ICD-10-CM

## 2024-05-31 ENCOUNTER — Other Ambulatory Visit: Payer: Self-pay | Admitting: Neurology

## 2024-05-31 DIAGNOSIS — G43009 Migraine without aura, not intractable, without status migrainosus: Secondary | ICD-10-CM

## 2024-06-06 ENCOUNTER — Other Ambulatory Visit: Payer: Self-pay | Admitting: Neurology

## 2024-06-06 DIAGNOSIS — G43009 Migraine without aura, not intractable, without status migrainosus: Secondary | ICD-10-CM

## 2024-06-22 ENCOUNTER — Ambulatory Visit: Admitting: Neurology

## 2024-07-18 ENCOUNTER — Other Ambulatory Visit: Admitting: Urology
